# Patient Record
Sex: Female | Born: 1955
Health system: Southern US, Community
[De-identification: ages and names within clinical notes are randomized; demographics above are authoritative.]

## PROBLEM LIST (undated history)

## (undated) DIAGNOSIS — R600 Localized edema: Secondary | ICD-10-CM

## (undated) DIAGNOSIS — G40909 Epilepsy, unspecified, not intractable, without status epilepticus: Secondary | ICD-10-CM

## (undated) DIAGNOSIS — Z8669 Personal history of other diseases of the nervous system and sense organs: Secondary | ICD-10-CM

## (undated) DIAGNOSIS — M1990 Unspecified osteoarthritis, unspecified site: Secondary | ICD-10-CM

## (undated) DIAGNOSIS — M8580 Other specified disorders of bone density and structure, unspecified site: Secondary | ICD-10-CM

## (undated) DIAGNOSIS — R011 Cardiac murmur, unspecified: Secondary | ICD-10-CM

## (undated) DIAGNOSIS — G2119 Other drug induced secondary parkinsonism: Secondary | ICD-10-CM

## (undated) DIAGNOSIS — R569 Unspecified convulsions: Secondary | ICD-10-CM

## (undated) HISTORY — DX: Other specified disorders of bone density and structure, unspecified site: M85.80

## (undated) HISTORY — DX: Epilepsy, unspecified, not intractable, without status epilepticus: G40.909

## (undated) HISTORY — DX: Other drug induced secondary parkinsonism: G21.19

## (undated) HISTORY — DX: Personal history of other diseases of the nervous system and sense organs: Z86.69

## (undated) HISTORY — DX: Cardiac murmur, unspecified: R01.1

## (undated) HISTORY — DX: Localized edema: R60.0

## (undated) HISTORY — PX: TONSILLECTOMY: SUR1361

## (undated) HISTORY — DX: Unspecified osteoarthritis, unspecified site: M19.90

## (undated) HISTORY — PX: ESOPHAGOGASTRODUODENOSCOPY: SHX1529

---

## 1998-11-12 ENCOUNTER — Emergency Department (HOSPITAL_COMMUNITY): Admission: EM | Admit: 1998-11-12 | Discharge: 1998-11-12 | Payer: Self-pay | Admitting: *Deleted

## 1998-11-12 ENCOUNTER — Encounter: Payer: Self-pay | Admitting: Emergency Medicine

## 1998-11-12 ENCOUNTER — Encounter: Payer: Self-pay | Admitting: *Deleted

## 2000-01-21 ENCOUNTER — Other Ambulatory Visit: Admission: RE | Admit: 2000-01-21 | Discharge: 2000-01-21 | Payer: Self-pay | Admitting: Family Medicine

## 2000-01-31 ENCOUNTER — Other Ambulatory Visit: Admission: RE | Admit: 2000-01-31 | Discharge: 2000-01-31 | Payer: Self-pay | Admitting: *Deleted

## 2001-07-22 ENCOUNTER — Other Ambulatory Visit: Admission: RE | Admit: 2001-07-22 | Discharge: 2001-07-22 | Payer: Self-pay | Admitting: Obstetrics and Gynecology

## 2001-07-30 ENCOUNTER — Ambulatory Visit (HOSPITAL_COMMUNITY): Admission: RE | Admit: 2001-07-30 | Discharge: 2001-07-30 | Payer: Self-pay | Admitting: Obstetrics and Gynecology

## 2002-09-22 ENCOUNTER — Other Ambulatory Visit: Admission: RE | Admit: 2002-09-22 | Discharge: 2002-09-22 | Payer: Self-pay | Admitting: Obstetrics and Gynecology

## 2003-11-08 ENCOUNTER — Other Ambulatory Visit: Admission: RE | Admit: 2003-11-08 | Discharge: 2003-11-08 | Payer: Self-pay | Admitting: Obstetrics and Gynecology

## 2005-07-18 ENCOUNTER — Other Ambulatory Visit: Admission: RE | Admit: 2005-07-18 | Discharge: 2005-07-18 | Payer: Self-pay | Admitting: Obstetrics and Gynecology

## 2010-04-29 ENCOUNTER — Emergency Department (HOSPITAL_COMMUNITY): Admission: EM | Admit: 2010-04-29 | Discharge: 2010-04-29 | Payer: Self-pay | Admitting: Family Medicine

## 2012-10-02 ENCOUNTER — Encounter (HOSPITAL_COMMUNITY): Payer: Self-pay | Admitting: Emergency Medicine

## 2012-10-02 ENCOUNTER — Emergency Department (HOSPITAL_COMMUNITY)
Admission: EM | Admit: 2012-10-02 | Discharge: 2012-10-02 | Disposition: A | Discharge status: Home / Self Care | Payer: BC Managed Care – PPO | Attending: Emergency Medicine | Admitting: Emergency Medicine

## 2012-10-02 ENCOUNTER — Emergency Department (HOSPITAL_COMMUNITY): Payer: BC Managed Care – PPO

## 2012-10-02 DIAGNOSIS — R569 Unspecified convulsions: Secondary | ICD-10-CM | POA: Insufficient documentation

## 2012-10-02 DIAGNOSIS — Y9389 Activity, other specified: Secondary | ICD-10-CM | POA: Insufficient documentation

## 2012-10-02 DIAGNOSIS — W010XXA Fall on same level from slipping, tripping and stumbling without subsequent striking against object, initial encounter: Secondary | ICD-10-CM | POA: Insufficient documentation

## 2012-10-02 DIAGNOSIS — Z79899 Other long term (current) drug therapy: Secondary | ICD-10-CM | POA: Insufficient documentation

## 2012-10-02 DIAGNOSIS — S0100XA Unspecified open wound of scalp, initial encounter: Secondary | ICD-10-CM | POA: Insufficient documentation

## 2012-10-02 DIAGNOSIS — S0101XA Laceration without foreign body of scalp, initial encounter: Secondary | ICD-10-CM

## 2012-10-02 DIAGNOSIS — S0003XA Contusion of scalp, initial encounter: Secondary | ICD-10-CM | POA: Insufficient documentation

## 2012-10-02 DIAGNOSIS — W19XXXA Unspecified fall, initial encounter: Secondary | ICD-10-CM | POA: Insufficient documentation

## 2012-10-02 DIAGNOSIS — S0083XA Contusion of other part of head, initial encounter: Secondary | ICD-10-CM | POA: Insufficient documentation

## 2012-10-02 DIAGNOSIS — Y939 Activity, unspecified: Secondary | ICD-10-CM | POA: Insufficient documentation

## 2012-10-02 DIAGNOSIS — Y92009 Unspecified place in unspecified non-institutional (private) residence as the place of occurrence of the external cause: Secondary | ICD-10-CM | POA: Insufficient documentation

## 2012-10-02 DIAGNOSIS — Z23 Encounter for immunization: Secondary | ICD-10-CM | POA: Insufficient documentation

## 2012-10-02 HISTORY — DX: Unspecified convulsions: R56.9

## 2012-10-02 LAB — COMPREHENSIVE METABOLIC PANEL
Alkaline Phosphatase: 80 U/L (ref 39–117)
BUN: 13 mg/dL (ref 6–23)
Chloride: 100 mEq/L (ref 96–112)
GFR calc Af Amer: >90 mL/min (ref >90)
Glucose, Bld: 93 mg/dL (ref 70–99)
Potassium: 3.8 mEq/L (ref 3.5–5.1)
Total Bilirubin: 0.3 mg/dL (ref 0.3–1.2)
Total Protein: 6.8 g/dL (ref 6.0–8.3)

## 2012-10-02 LAB — CBC
Platelets: 211 10*3/uL (ref 150–400)
RDW: 13.7 % (ref 11.5–15.5)
WBC: 9.1 10*3/uL (ref 4.0–10.5)

## 2012-10-02 MED ORDER — TETANUS-DIPHTH-ACELL PERTUSSIS 5-2.5-18.5 LF-MCG/0.5 IM SUSP
0.5000 mL | Freq: Once | INTRAMUSCULAR | Status: DC
  Filled 2012-10-02: qty 0.5

## 2012-10-02 MED ORDER — TETANUS-DIPHTH-ACELL PERTUSSIS 5-2.5-18.5 LF-MCG/0.5 IM SUSP
0.5000 mL | Freq: Once | INTRAMUSCULAR | Status: AC
  Administered 2012-10-02: 0.5 mL via INTRAMUSCULAR

## 2012-10-02 NOTE — ED Notes (Signed)
Patient transported to CT 

## 2012-10-02 NOTE — ED Notes (Signed)
Returned from CT.

## 2012-10-02 NOTE — ED Provider Notes (Signed)
Patient seen by Dr Denton Lank.  I saw patient only for repair of occipital head laceration. Pt with Y-shaped laceration over occiput.    LACERATION REPAIR Performed by: Trixie Dredge B Authorized by: Trixie Dredge B Consent: Verbal consent obtained. Risks and benefits: risks, benefits and alternatives were discussed Consent given by: patient Patient identity confirmed: provided demographic data Prepped and Draped in normal sterile fashion Wound explored  Laceration Location: occiput  Laceration Length: 6cm  No Foreign Bodies seen or palpated  Anesthesia: local infiltration  Local anesthetic: lidocaine 2% with epinephrine  Anesthetic total: 6 ml  Irrigation method: syringe Amount of cleaning: standard (following wound care by tech)  Skin closure: staples  Number of staples: 12  Technique: see above  Patient tolerance: Patient tolerated the procedure well with no immediate complications.   [place], [place] 10/02/12 1927

## 2012-10-02 NOTE — ED Notes (Signed)
Per EMS, patient tripped on step getting mail, hit back of head, no LOC-left lower head laceration, approximately 150cc of blood loss

## 2012-10-02 NOTE — ED Notes (Signed)
Patient is alert and oriented x3.  She was given DC instructions and follow up visit instructions.  Patient gave verbal understanding. She was DC ambulatory under her own power to home.  V/S stable.  He was not showing any signs of distress on DC 

## 2012-10-02 NOTE — ED Provider Notes (Signed)
History     CSN: [id number]  Arrival date & time 10/02/12  1628   First MD Initiated Contact with Patient 10/02/12 1754      Chief Complaint  Patient presents with  . Fall  . Head Laceration    (Consider location/radiation/quality/duration/timing/severity/associated sxs/prior treatment) Patient is a 56 y.o. female presenting with fall and scalp laceration. The history is provided by the patient and the spouse.  Fall Pertinent negatives include no fever, no numbness, no abdominal pain and no headaches.  Head Laceration Pertinent negatives include no chest pain, no abdominal pain, no headaches and no shortness of breath.  pt w hx seizures presents after fall at home. States was in front of home, states unsure what cause fall, but denies loc/syncope. Denies fever. When family got to her was awake and alert, at baseline with no postictal period. w seizures, has 8-10 seizures at baseline. States neurologist recently adjusted her meds, no new meds, states compliant w rx. Denies recent increase in seizure frequency. No headache. Contusion/laceration to posterior scalp. Denies neck or back pain. No numbness/weakness. Has been eating and drinking normally. No nvd. No abd pain. No cp or sob. No palpitations or irregular heartbeat. No faintness.  Tetanus unknown. Denies other pain/injury. No fever or chills.      Past Medical History  Diagnosis Date  . Seizures     No past surgical history on file.  No family history on file.  History  Substance Use Topics  . Smoking status: Never Smoker   . Smokeless tobacco: Not on file  . Alcohol Use: No    OB History    Grav Para Term Preterm Abortions TAB SAB Ect Mult Living                  Review of Systems  Constitutional: Negative for fever and chills.  HENT: Negative for neck pain.   Eyes: Negative for visual disturbance.  Respiratory: Negative for shortness of breath.   Cardiovascular: Negative for chest pain and palpitations.   Gastrointestinal: Negative for abdominal pain.  Genitourinary: Negative for dysuria and flank pain.  Musculoskeletal: Negative for back pain.  Skin: Positive for wound.  Neurological: Negative for weakness, numbness and headaches.  Hematological: Does not bruise/bleed easily.  Psychiatric/Behavioral: Negative for confusion.    Allergies  Review of patient's allergies indicates no known allergies.  Home Medications   Current Outpatient Rx  Name  Route  Sig  Dispense  Refill  . ARIPIPRAZOLE 20 MG PO TABS   Oral   Take 20 mg by mouth at bedtime.         Marland Kitchen BENZTROPINE MESYLATE 0.5 MG PO TABS   Oral   Take 0.5 mg by mouth 2 (two) times daily.         . CLOBAZAM 10 MG PO TABS   Oral   Take 10 mg by mouth 2 (two) times daily.         Marland Kitchen LACOSAMIDE 100 MG PO TABS   Oral   Take 150 mg by mouth 2 (two) times daily.         Marland Kitchen LAMOTRIGINE 200 MG PO TABS   Oral   Take 400 mg by mouth 2 (two) times daily.         Marland Kitchen LEVETIRACETAM 500 MG PO TABS   Oral   Take 1,000 mg by mouth every 12 (twelve) hours. Two tablets in the morning and two in the evening  BP 148/78  Pulse 72  Temp 97.5 F (36.4 C) (Oral)  Resp 18  Physical Exam  Nursing note and vitals reviewed. Constitutional: She is oriented to person, place, and time. She appears well-developed and well-nourished. No distress.  HENT:  Mouth/Throat: Oropharynx is clear and moist.       Contusion, laceration to posterior scalp. No oral or tongue trauma.   Eyes: Conjunctivae normal are normal. No scleral icterus.  Neck: Normal range of motion. Neck supple. No tracheal deviation present.       No stiffness or rigidity. No bruit. Mid cervical tenderness, mild.   Cardiovascular: Normal rate, regular rhythm, normal heart sounds and intact distal pulses.   Pulmonary/Chest: Effort normal and breath sounds normal. No respiratory distress. She exhibits no tenderness.  Abdominal: Soft. Normal appearance. She  exhibits no distension. There is no tenderness.  Genitourinary:       No cva tenderness  Musculoskeletal: She exhibits no edema and no tenderness.       Good rom bil extremities, no focal bony tenderness. Mid cervical mild tenderness, otherwise CTLS spine, non tender, aligned, no step off.   Neurological: She is alert and oriented to person, place, and time.       Motor intact bil.   Skin: Skin is warm and dry. No rash noted.  Psychiatric: She has a normal mood and affect.    ED Course  Procedures (including critical care time)  Results for orders placed during the hospital encounter of 10/02/12  COMPREHENSIVE METABOLIC PANEL      Component Value Range   Sodium 141  135 - 145 mEq/L   Potassium 3.8  3.5 - 5.1 mEq/L   Chloride 100  96 - 112 mEq/L   CO2 33 (*) 19 - 32 mEq/L   Glucose, Bld 93  70 - 99 mg/dL   BUN 13  6 - 23 mg/dL   Creatinine, Ser 1.61  0.50 - 1.10 mg/dL   Calcium 9.5  8.4 - 09.6 mg/dL   Total Protein 6.8  6.0 - 8.3 g/dL   Albumin 3.8  3.5 - 5.2 g/dL   AST 23  0 - 37 U/L   ALT 15  0 - 35 U/L   Alkaline Phosphatase 80  39 - 117 U/L   Total Bilirubin 0.3  0.3 - 1.2 mg/dL   GFR calc non Af Amer 78 (*) >90 mL/min   GFR calc Af Amer >90  >90 mL/min  CBC      Component Value Range   WBC 9.1  4.0 - 10.5 K/uL   RBC 4.13  3.87 - 5.11 MIL/uL   Hemoglobin 12.6  12.0 - 15.0 g/dL   HCT 04.5  40.9 - 81.1 %   MCV 91.5  78.0 - 100.0 fL   MCH 30.5  26.0 - 34.0 pg   MCHC 33.3  30.0 - 36.0 g/dL   RDW 91.4  78.2 - 95.6 %   Platelets 211  150 - 400 K/uL   Ct Head Wo Contrast  10/02/2012  *RADIOLOGY REPORT*  Clinical Data:  Fall, laceration  CT HEAD WITHOUT CONTRAST CT CERVICAL SPINE WITHOUT CONTRAST  Technique:  Multidetector CT imaging of the head and cervical spine was performed following the standard protocol without intravenous contrast.  Multiplanar CT image reconstructions of the cervical spine were also generated.  Comparison:   None  CT HEAD  Findings: No skull fracture  is noted.  Paranasal sinuses and mastoid air cells are unremarkable. A scalp  laceration and soft tissue irregularity in the left posterior parietal region.  Tiny amount of subcutaneous air is noted in this region.  No intracranial hemorrhage, mass effect or midline shift.  No acute infarction.  No mass lesion is noted on this unenhanced scan.  IMPRESSION: No acute intracranial abnormality. Scalp laceration, soft tissue irregularity and small amount of subcutaneous air in the left posterior parietal region.  CT CERVICAL SPINE  Findings: Axial images of the cervical spine shows no acute fracture or subluxation.  There is no pneumothorax in visualized lung apices.  Computer processed images shows no acute fracture or subluxation.  There is disc space flattening with mild posterior spurring at C4-C5 level. Disc space flattening with mild anterior and mild posterior spurring noted at C5-C6 and C6-C7 level. No prevertebral soft tissue swelling.  Cervical airway is patent.  IMPRESSION: No acute fracture or subluxation.  Mild degenerative changes as described above.   Original Report Authenticated By: Natasha Mead, M.D.    Ct Cervical Spine Wo Contrast  10/02/2012  *RADIOLOGY REPORT*  Clinical Data:  Fall, laceration  CT HEAD WITHOUT CONTRAST CT CERVICAL SPINE WITHOUT CONTRAST  Technique:  Multidetector CT imaging of the head and cervical spine was performed following the standard protocol without intravenous contrast.  Multiplanar CT image reconstructions of the cervical spine were also generated.  Comparison:   None  CT HEAD  Findings: No skull fracture is noted.  Paranasal sinuses and mastoid air cells are unremarkable. A scalp laceration and soft tissue irregularity in the left posterior parietal region.  Tiny amount of subcutaneous air is noted in this region.  No intracranial hemorrhage, mass effect or midline shift.  No acute infarction.  No mass lesion is noted on this unenhanced scan.  IMPRESSION: No acute  intracranial abnormality. Scalp laceration, soft tissue irregularity and small amount of subcutaneous air in the left posterior parietal region.  CT CERVICAL SPINE  Findings: Axial images of the cervical spine shows no acute fracture or subluxation.  There is no pneumothorax in visualized lung apices.  Computer processed images shows no acute fracture or subluxation.  There is disc space flattening with mild posterior spurring at C4-C5 level. Disc space flattening with mild anterior and mild posterior spurring noted at C5-C6 and C6-C7 level. No prevertebral soft tissue swelling.  Cervical airway is patent.  IMPRESSION: No acute fracture or subluxation.  Mild degenerative changes as described above.   Original Report Authenticated By: Natasha Mead, M.D.        MDM  Ct. Wound care.  Reviewed nursing notes and prior charts for additional history.    Date: 10/02/2012  Rate: 60  Rhythm: normal sinus rhythm  QRS Axis: normal  Intervals: normal  ST/T Wave abnormalities: normal  Conduction Disutrbances:none  Narrative Interpretation:   Old EKG Reviewed: none available  Recheck alert content, no distress.   Recheck spine nt. pts mental status at baseline. No sz activity. No incontinence, oral trauma, sz activity noted, or post ictal period.   Pt appears stable for d/c.   LACERATION REPAIR Performed by: Rennis Chris by PA Shelva Majestic Authorized by: Suzi Roots Consent: Verbal consent obtained. Risks and benefits: risks, benefits and alternatives were discussed Consent given by: patient Patient identity confirmed: provided demographic data Prepped and Draped in normal sterile fashion Wound explored  Laceration Location: scalp  Laceration Length: 6cm  No Foreign Bodies seen or palpated  Anesthesia: local infiltration  Local anesthetic: lidocaine 2% w epinephrine  Anesthetic total: 8 ml  Irrigation  method: syringe Amount of cleaning: standard  Skin closure: surgical  staples  Number of sutures: staples, 10  Technique: staples  Patient tolerance: Patient tolerated the procedure well with no immediate complications.         Suzi Roots, MD 10/02/12 2023

## 2012-10-06 NOTE — ED Provider Notes (Signed)
Medical screening examination/treatment/procedure(s) were conducted as a shared visit with non-physician practitioner(s) and myself.  I personally evaluated the patient during the encounter See h and P  Suzi Roots, MD 10/06/12 1102

## 2013-01-26 DIAGNOSIS — H40019 Open angle with borderline findings, low risk, unspecified eye: Secondary | ICD-10-CM | POA: Diagnosis not present

## 2013-02-05 DIAGNOSIS — H40019 Open angle with borderline findings, low risk, unspecified eye: Secondary | ICD-10-CM | POA: Diagnosis not present

## 2013-07-20 DIAGNOSIS — Z01419 Encounter for gynecological examination (general) (routine) without abnormal findings: Secondary | ICD-10-CM | POA: Diagnosis not present

## 2013-07-20 DIAGNOSIS — Z1231 Encounter for screening mammogram for malignant neoplasm of breast: Secondary | ICD-10-CM | POA: Diagnosis not present

## 2013-07-26 DIAGNOSIS — M899 Disorder of bone, unspecified: Secondary | ICD-10-CM | POA: Diagnosis not present

## 2013-07-26 DIAGNOSIS — Z13820 Encounter for screening for osteoporosis: Secondary | ICD-10-CM | POA: Diagnosis not present

## 2013-10-29 DIAGNOSIS — L821 Other seborrheic keratosis: Secondary | ICD-10-CM | POA: Diagnosis not present

## 2013-11-30 DIAGNOSIS — H40019 Open angle with borderline findings, low risk, unspecified eye: Secondary | ICD-10-CM | POA: Diagnosis not present

## 2014-01-31 DIAGNOSIS — H40029 Open angle with borderline findings, high risk, unspecified eye: Secondary | ICD-10-CM | POA: Diagnosis not present

## 2014-02-14 DIAGNOSIS — S93609A Unspecified sprain of unspecified foot, initial encounter: Secondary | ICD-10-CM | POA: Diagnosis not present

## 2014-02-14 DIAGNOSIS — S92919A Unspecified fracture of unspecified toe(s), initial encounter for closed fracture: Secondary | ICD-10-CM | POA: Diagnosis not present

## 2014-02-21 DIAGNOSIS — S92309A Fracture of unspecified metatarsal bone(s), unspecified foot, initial encounter for closed fracture: Secondary | ICD-10-CM | POA: Diagnosis not present

## 2014-03-15 DIAGNOSIS — S92309A Fracture of unspecified metatarsal bone(s), unspecified foot, initial encounter for closed fracture: Secondary | ICD-10-CM | POA: Diagnosis not present

## 2014-05-05 DIAGNOSIS — L255 Unspecified contact dermatitis due to plants, except food: Secondary | ICD-10-CM | POA: Diagnosis not present

## 2014-05-26 DIAGNOSIS — M949 Disorder of cartilage, unspecified: Secondary | ICD-10-CM | POA: Diagnosis not present

## 2014-05-26 DIAGNOSIS — M899 Disorder of bone, unspecified: Secondary | ICD-10-CM | POA: Diagnosis not present

## 2014-05-26 DIAGNOSIS — Z79899 Other long term (current) drug therapy: Secondary | ICD-10-CM | POA: Diagnosis not present

## 2014-05-26 DIAGNOSIS — Z13220 Encounter for screening for lipoid disorders: Secondary | ICD-10-CM | POA: Diagnosis not present

## 2014-05-26 DIAGNOSIS — Z0000 Encounter for general adult medical examination without abnormal findings: Secondary | ICD-10-CM | POA: Diagnosis not present

## 2014-05-26 DIAGNOSIS — F29 Unspecified psychosis not due to a substance or known physiological condition: Secondary | ICD-10-CM | POA: Diagnosis not present

## 2014-05-26 DIAGNOSIS — R569 Unspecified convulsions: Secondary | ICD-10-CM | POA: Diagnosis not present

## 2014-05-26 DIAGNOSIS — G4733 Obstructive sleep apnea (adult) (pediatric): Secondary | ICD-10-CM | POA: Diagnosis not present

## 2014-05-27 DIAGNOSIS — Z136 Encounter for screening for cardiovascular disorders: Secondary | ICD-10-CM | POA: Diagnosis not present

## 2014-05-27 DIAGNOSIS — F29 Unspecified psychosis not due to a substance or known physiological condition: Secondary | ICD-10-CM | POA: Diagnosis not present

## 2014-05-27 DIAGNOSIS — M899 Disorder of bone, unspecified: Secondary | ICD-10-CM | POA: Diagnosis not present

## 2014-05-27 DIAGNOSIS — R569 Unspecified convulsions: Secondary | ICD-10-CM | POA: Diagnosis not present

## 2014-05-27 DIAGNOSIS — Z79899 Other long term (current) drug therapy: Secondary | ICD-10-CM | POA: Diagnosis not present

## 2014-05-27 DIAGNOSIS — G4733 Obstructive sleep apnea (adult) (pediatric): Secondary | ICD-10-CM | POA: Diagnosis not present

## 2014-05-27 DIAGNOSIS — Z0000 Encounter for general adult medical examination without abnormal findings: Secondary | ICD-10-CM | POA: Diagnosis not present

## 2014-09-01 DIAGNOSIS — Z23 Encounter for immunization: Secondary | ICD-10-CM | POA: Diagnosis not present

## 2014-10-12 DIAGNOSIS — G40109 Localization-related (focal) (partial) symptomatic epilepsy and epileptic syndromes with simple partial seizures, not intractable, without status epilepticus: Secondary | ICD-10-CM | POA: Diagnosis not present

## 2015-02-23 DIAGNOSIS — H524 Presbyopia: Secondary | ICD-10-CM | POA: Diagnosis not present

## 2015-02-23 DIAGNOSIS — H40023 Open angle with borderline findings, high risk, bilateral: Secondary | ICD-10-CM | POA: Diagnosis not present

## 2015-04-26 DIAGNOSIS — M79605 Pain in left leg: Secondary | ICD-10-CM | POA: Diagnosis not present

## 2015-04-26 DIAGNOSIS — M25562 Pain in left knee: Secondary | ICD-10-CM | POA: Diagnosis not present

## 2015-07-05 DIAGNOSIS — Z9989 Dependence on other enabling machines and devices: Secondary | ICD-10-CM | POA: Diagnosis not present

## 2015-07-05 DIAGNOSIS — Z79899 Other long term (current) drug therapy: Secondary | ICD-10-CM | POA: Diagnosis not present

## 2015-07-05 DIAGNOSIS — G40109 Localization-related (focal) (partial) symptomatic epilepsy and epileptic syndromes with simple partial seizures, not intractable, without status epilepticus: Secondary | ICD-10-CM | POA: Diagnosis not present

## 2015-07-05 DIAGNOSIS — G4733 Obstructive sleep apnea (adult) (pediatric): Secondary | ICD-10-CM | POA: Diagnosis not present

## 2015-07-05 DIAGNOSIS — Z8659 Personal history of other mental and behavioral disorders: Secondary | ICD-10-CM | POA: Diagnosis not present

## 2015-08-03 DIAGNOSIS — Z6831 Body mass index (BMI) 31.0-31.9, adult: Secondary | ICD-10-CM | POA: Diagnosis not present

## 2015-08-03 DIAGNOSIS — Z124 Encounter for screening for malignant neoplasm of cervix: Secondary | ICD-10-CM | POA: Diagnosis not present

## 2015-08-03 DIAGNOSIS — Z1231 Encounter for screening mammogram for malignant neoplasm of breast: Secondary | ICD-10-CM | POA: Diagnosis not present

## 2015-08-03 DIAGNOSIS — M8588 Other specified disorders of bone density and structure, other site: Secondary | ICD-10-CM | POA: Diagnosis not present

## 2015-08-03 DIAGNOSIS — N958 Other specified menopausal and perimenopausal disorders: Secondary | ICD-10-CM | POA: Diagnosis not present

## 2015-08-03 DIAGNOSIS — Z01419 Encounter for gynecological examination (general) (routine) without abnormal findings: Secondary | ICD-10-CM | POA: Diagnosis not present

## 2015-09-19 DIAGNOSIS — G2119 Other drug induced secondary parkinsonism: Secondary | ICD-10-CM | POA: Diagnosis not present

## 2016-04-11 DIAGNOSIS — M25562 Pain in left knee: Secondary | ICD-10-CM | POA: Diagnosis not present

## 2016-04-12 DIAGNOSIS — M25562 Pain in left knee: Secondary | ICD-10-CM | POA: Diagnosis not present

## 2016-04-22 DIAGNOSIS — D225 Melanocytic nevi of trunk: Secondary | ICD-10-CM | POA: Diagnosis not present

## 2016-04-22 DIAGNOSIS — L821 Other seborrheic keratosis: Secondary | ICD-10-CM | POA: Diagnosis not present

## 2016-04-22 DIAGNOSIS — Z808 Family history of malignant neoplasm of other organs or systems: Secondary | ICD-10-CM | POA: Diagnosis not present

## 2016-04-22 DIAGNOSIS — D485 Neoplasm of uncertain behavior of skin: Secondary | ICD-10-CM | POA: Diagnosis not present

## 2016-04-22 DIAGNOSIS — D1801 Hemangioma of skin and subcutaneous tissue: Secondary | ICD-10-CM | POA: Diagnosis not present

## 2016-05-14 DIAGNOSIS — M1712 Unilateral primary osteoarthritis, left knee: Secondary | ICD-10-CM | POA: Diagnosis not present

## 2016-05-23 DIAGNOSIS — E785 Hyperlipidemia, unspecified: Secondary | ICD-10-CM | POA: Diagnosis not present

## 2016-05-27 DIAGNOSIS — Z23 Encounter for immunization: Secondary | ICD-10-CM | POA: Diagnosis not present

## 2016-05-27 DIAGNOSIS — Z0000 Encounter for general adult medical examination without abnormal findings: Secondary | ICD-10-CM | POA: Diagnosis not present

## 2016-05-31 DIAGNOSIS — R21 Rash and other nonspecific skin eruption: Secondary | ICD-10-CM | POA: Diagnosis not present

## 2016-06-27 DIAGNOSIS — Z9989 Dependence on other enabling machines and devices: Secondary | ICD-10-CM | POA: Diagnosis not present

## 2016-06-27 DIAGNOSIS — Z79899 Other long term (current) drug therapy: Secondary | ICD-10-CM | POA: Diagnosis not present

## 2016-06-27 DIAGNOSIS — G40209 Localization-related (focal) (partial) symptomatic epilepsy and epileptic syndromes with complex partial seizures, not intractable, without status epilepticus: Secondary | ICD-10-CM | POA: Diagnosis not present

## 2016-06-27 DIAGNOSIS — G4733 Obstructive sleep apnea (adult) (pediatric): Secondary | ICD-10-CM | POA: Diagnosis not present

## 2016-09-03 DIAGNOSIS — Z23 Encounter for immunization: Secondary | ICD-10-CM | POA: Diagnosis not present

## 2016-09-23 DIAGNOSIS — Z01419 Encounter for gynecological examination (general) (routine) without abnormal findings: Secondary | ICD-10-CM | POA: Diagnosis not present

## 2016-09-23 DIAGNOSIS — Z1231 Encounter for screening mammogram for malignant neoplasm of breast: Secondary | ICD-10-CM | POA: Diagnosis not present

## 2016-09-23 DIAGNOSIS — Z6829 Body mass index (BMI) 29.0-29.9, adult: Secondary | ICD-10-CM | POA: Diagnosis not present

## 2016-09-25 DIAGNOSIS — M1712 Unilateral primary osteoarthritis, left knee: Secondary | ICD-10-CM | POA: Diagnosis not present

## 2016-11-08 DIAGNOSIS — M1712 Unilateral primary osteoarthritis, left knee: Secondary | ICD-10-CM | POA: Diagnosis not present

## 2016-11-18 DIAGNOSIS — M1712 Unilateral primary osteoarthritis, left knee: Secondary | ICD-10-CM | POA: Diagnosis not present

## 2016-11-22 DIAGNOSIS — M1712 Unilateral primary osteoarthritis, left knee: Secondary | ICD-10-CM | POA: Diagnosis not present

## 2016-11-26 DIAGNOSIS — Z1211 Encounter for screening for malignant neoplasm of colon: Secondary | ICD-10-CM | POA: Diagnosis not present

## 2016-11-27 DIAGNOSIS — M1712 Unilateral primary osteoarthritis, left knee: Secondary | ICD-10-CM | POA: Diagnosis not present

## 2016-11-29 DIAGNOSIS — M1712 Unilateral primary osteoarthritis, left knee: Secondary | ICD-10-CM | POA: Diagnosis not present

## 2016-12-03 DIAGNOSIS — M1712 Unilateral primary osteoarthritis, left knee: Secondary | ICD-10-CM | POA: Diagnosis not present

## 2016-12-04 DIAGNOSIS — H25013 Cortical age-related cataract, bilateral: Secondary | ICD-10-CM | POA: Diagnosis not present

## 2016-12-04 DIAGNOSIS — H40023 Open angle with borderline findings, high risk, bilateral: Secondary | ICD-10-CM | POA: Diagnosis not present

## 2016-12-04 DIAGNOSIS — Z83511 Family history of glaucoma: Secondary | ICD-10-CM | POA: Diagnosis not present

## 2016-12-04 DIAGNOSIS — H2513 Age-related nuclear cataract, bilateral: Secondary | ICD-10-CM | POA: Diagnosis not present

## 2016-12-06 DIAGNOSIS — M1712 Unilateral primary osteoarthritis, left knee: Secondary | ICD-10-CM | POA: Diagnosis not present

## 2016-12-09 DIAGNOSIS — H2513 Age-related nuclear cataract, bilateral: Secondary | ICD-10-CM | POA: Diagnosis not present

## 2016-12-09 DIAGNOSIS — H40023 Open angle with borderline findings, high risk, bilateral: Secondary | ICD-10-CM | POA: Diagnosis not present

## 2016-12-09 DIAGNOSIS — Z83511 Family history of glaucoma: Secondary | ICD-10-CM | POA: Diagnosis not present

## 2016-12-10 DIAGNOSIS — M1712 Unilateral primary osteoarthritis, left knee: Secondary | ICD-10-CM | POA: Diagnosis not present

## 2016-12-13 DIAGNOSIS — M1712 Unilateral primary osteoarthritis, left knee: Secondary | ICD-10-CM | POA: Diagnosis not present

## 2016-12-17 DIAGNOSIS — M1712 Unilateral primary osteoarthritis, left knee: Secondary | ICD-10-CM | POA: Diagnosis not present

## 2016-12-24 DIAGNOSIS — M1712 Unilateral primary osteoarthritis, left knee: Secondary | ICD-10-CM | POA: Diagnosis not present

## 2016-12-26 DIAGNOSIS — M1712 Unilateral primary osteoarthritis, left knee: Secondary | ICD-10-CM | POA: Diagnosis not present

## 2016-12-31 DIAGNOSIS — M1712 Unilateral primary osteoarthritis, left knee: Secondary | ICD-10-CM | POA: Diagnosis not present

## 2017-01-03 DIAGNOSIS — M1712 Unilateral primary osteoarthritis, left knee: Secondary | ICD-10-CM | POA: Diagnosis not present

## 2017-01-08 DIAGNOSIS — M1712 Unilateral primary osteoarthritis, left knee: Secondary | ICD-10-CM | POA: Diagnosis not present

## 2017-01-21 DIAGNOSIS — G40802 Other epilepsy, not intractable, without status epilepticus: Secondary | ICD-10-CM | POA: Diagnosis not present

## 2017-01-21 DIAGNOSIS — Z9989 Dependence on other enabling machines and devices: Secondary | ICD-10-CM | POA: Diagnosis not present

## 2017-01-21 DIAGNOSIS — Z8659 Personal history of other mental and behavioral disorders: Secondary | ICD-10-CM | POA: Diagnosis not present

## 2017-01-21 DIAGNOSIS — R569 Unspecified convulsions: Secondary | ICD-10-CM | POA: Diagnosis not present

## 2017-01-21 DIAGNOSIS — G4733 Obstructive sleep apnea (adult) (pediatric): Secondary | ICD-10-CM | POA: Diagnosis not present

## 2017-01-21 DIAGNOSIS — Z79899 Other long term (current) drug therapy: Secondary | ICD-10-CM | POA: Diagnosis not present

## 2017-02-14 DIAGNOSIS — M76822 Posterior tibial tendinitis, left leg: Secondary | ICD-10-CM | POA: Diagnosis not present

## 2017-02-14 DIAGNOSIS — M6702 Short Achilles tendon (acquired), left ankle: Secondary | ICD-10-CM | POA: Diagnosis not present

## 2017-02-28 DIAGNOSIS — M1712 Unilateral primary osteoarthritis, left knee: Secondary | ICD-10-CM | POA: Diagnosis not present

## 2017-04-16 DIAGNOSIS — M6702 Short Achilles tendon (acquired), left ankle: Secondary | ICD-10-CM | POA: Diagnosis not present

## 2017-04-16 DIAGNOSIS — M76822 Posterior tibial tendinitis, left leg: Secondary | ICD-10-CM | POA: Diagnosis not present

## 2017-06-04 DIAGNOSIS — Z0000 Encounter for general adult medical examination without abnormal findings: Secondary | ICD-10-CM | POA: Diagnosis not present

## 2017-06-04 DIAGNOSIS — M8580 Other specified disorders of bone density and structure, unspecified site: Secondary | ICD-10-CM | POA: Diagnosis not present

## 2017-06-04 DIAGNOSIS — R8290 Unspecified abnormal findings in urine: Secondary | ICD-10-CM | POA: Diagnosis not present

## 2017-06-04 DIAGNOSIS — Z79899 Other long term (current) drug therapy: Secondary | ICD-10-CM | POA: Diagnosis not present

## 2017-06-04 DIAGNOSIS — E663 Overweight: Secondary | ICD-10-CM | POA: Diagnosis not present

## 2017-06-04 DIAGNOSIS — G40909 Epilepsy, unspecified, not intractable, without status epilepticus: Secondary | ICD-10-CM | POA: Diagnosis not present

## 2017-06-04 DIAGNOSIS — F29 Unspecified psychosis not due to a substance or known physiological condition: Secondary | ICD-10-CM | POA: Diagnosis not present

## 2017-06-04 DIAGNOSIS — M899 Disorder of bone, unspecified: Secondary | ICD-10-CM | POA: Diagnosis not present

## 2017-06-04 DIAGNOSIS — Z6828 Body mass index (BMI) 28.0-28.9, adult: Secondary | ICD-10-CM | POA: Diagnosis not present

## 2017-06-04 DIAGNOSIS — R1310 Dysphagia, unspecified: Secondary | ICD-10-CM | POA: Diagnosis not present

## 2017-06-06 ENCOUNTER — Other Ambulatory Visit: Payer: Self-pay | Admitting: Gastroenterology

## 2017-06-06 DIAGNOSIS — R1310 Dysphagia, unspecified: Secondary | ICD-10-CM

## 2017-06-10 DIAGNOSIS — D1800 Hemangioma unspecified site: Secondary | ICD-10-CM | POA: Diagnosis not present

## 2017-06-10 DIAGNOSIS — D2222 Melanocytic nevi of left ear and external auricular canal: Secondary | ICD-10-CM | POA: Diagnosis not present

## 2017-06-10 DIAGNOSIS — Z808 Family history of malignant neoplasm of other organs or systems: Secondary | ICD-10-CM | POA: Diagnosis not present

## 2017-06-10 DIAGNOSIS — D2272 Melanocytic nevi of left lower limb, including hip: Secondary | ICD-10-CM | POA: Diagnosis not present

## 2017-06-10 DIAGNOSIS — H40023 Open angle with borderline findings, high risk, bilateral: Secondary | ICD-10-CM | POA: Diagnosis not present

## 2017-06-10 DIAGNOSIS — L821 Other seborrheic keratosis: Secondary | ICD-10-CM | POA: Diagnosis not present

## 2017-06-12 ENCOUNTER — Ambulatory Visit
Admission: RE | Admit: 2017-06-12 | Discharge: 2017-06-12 | Disposition: A | Discharge status: Home / Self Care | Payer: Medicare Other | Source: Ambulatory Visit | Attending: Gastroenterology | Admitting: Gastroenterology

## 2017-06-12 DIAGNOSIS — R1310 Dysphagia, unspecified: Secondary | ICD-10-CM

## 2017-06-12 DIAGNOSIS — Z23 Encounter for immunization: Secondary | ICD-10-CM | POA: Diagnosis not present

## 2017-06-12 DIAGNOSIS — K219 Gastro-esophageal reflux disease without esophagitis: Secondary | ICD-10-CM | POA: Diagnosis not present

## 2017-07-01 DIAGNOSIS — R1310 Dysphagia, unspecified: Secondary | ICD-10-CM | POA: Diagnosis not present

## 2017-07-01 DIAGNOSIS — K222 Esophageal obstruction: Secondary | ICD-10-CM | POA: Diagnosis not present

## 2017-07-22 DIAGNOSIS — Z9989 Dependence on other enabling machines and devices: Secondary | ICD-10-CM | POA: Diagnosis not present

## 2017-07-22 DIAGNOSIS — G40909 Epilepsy, unspecified, not intractable, without status epilepticus: Secondary | ICD-10-CM | POA: Diagnosis not present

## 2017-07-22 DIAGNOSIS — G4733 Obstructive sleep apnea (adult) (pediatric): Secondary | ICD-10-CM | POA: Diagnosis not present

## 2017-07-22 DIAGNOSIS — Z8659 Personal history of other mental and behavioral disorders: Secondary | ICD-10-CM | POA: Diagnosis not present

## 2017-07-22 DIAGNOSIS — Z79899 Other long term (current) drug therapy: Secondary | ICD-10-CM | POA: Diagnosis not present

## 2017-08-11 DIAGNOSIS — Z23 Encounter for immunization: Secondary | ICD-10-CM | POA: Diagnosis not present

## 2017-10-07 DIAGNOSIS — Z124 Encounter for screening for malignant neoplasm of cervix: Secondary | ICD-10-CM | POA: Diagnosis not present

## 2017-10-07 DIAGNOSIS — Z1231 Encounter for screening mammogram for malignant neoplasm of breast: Secondary | ICD-10-CM | POA: Diagnosis not present

## 2017-10-07 DIAGNOSIS — Z6829 Body mass index (BMI) 29.0-29.9, adult: Secondary | ICD-10-CM | POA: Diagnosis not present

## 2017-12-11 DIAGNOSIS — H25013 Cortical age-related cataract, bilateral: Secondary | ICD-10-CM | POA: Diagnosis not present

## 2017-12-11 DIAGNOSIS — H2513 Age-related nuclear cataract, bilateral: Secondary | ICD-10-CM | POA: Diagnosis not present

## 2017-12-11 DIAGNOSIS — H40023 Open angle with borderline findings, high risk, bilateral: Secondary | ICD-10-CM | POA: Diagnosis not present

## 2018-01-01 DIAGNOSIS — Z79899 Other long term (current) drug therapy: Secondary | ICD-10-CM | POA: Diagnosis not present

## 2018-01-01 DIAGNOSIS — G40909 Epilepsy, unspecified, not intractable, without status epilepticus: Secondary | ICD-10-CM | POA: Diagnosis not present

## 2018-01-01 DIAGNOSIS — R2681 Unsteadiness on feet: Secondary | ICD-10-CM | POA: Diagnosis not present

## 2018-02-10 DIAGNOSIS — G20 Parkinson's disease: Secondary | ICD-10-CM | POA: Diagnosis not present

## 2018-02-10 DIAGNOSIS — Z79899 Other long term (current) drug therapy: Secondary | ICD-10-CM | POA: Diagnosis not present

## 2018-05-26 DIAGNOSIS — G2111 Neuroleptic induced parkinsonism: Secondary | ICD-10-CM | POA: Diagnosis not present

## 2018-05-26 DIAGNOSIS — Z79899 Other long term (current) drug therapy: Secondary | ICD-10-CM | POA: Diagnosis not present

## 2018-05-26 DIAGNOSIS — G20 Parkinson's disease: Secondary | ICD-10-CM | POA: Diagnosis not present

## 2018-05-26 DIAGNOSIS — Z780 Asymptomatic menopausal state: Secondary | ICD-10-CM | POA: Diagnosis not present

## 2018-05-26 DIAGNOSIS — R251 Tremor, unspecified: Secondary | ICD-10-CM | POA: Diagnosis not present

## 2018-05-26 DIAGNOSIS — G40019 Localization-related (focal) (partial) idiopathic epilepsy and epileptic syndromes with seizures of localized onset, intractable, without status epilepticus: Secondary | ICD-10-CM | POA: Diagnosis not present

## 2018-05-26 DIAGNOSIS — Z9989 Dependence on other enabling machines and devices: Secondary | ICD-10-CM | POA: Diagnosis not present

## 2018-05-26 DIAGNOSIS — G4733 Obstructive sleep apnea (adult) (pediatric): Secondary | ICD-10-CM | POA: Diagnosis not present

## 2018-05-26 DIAGNOSIS — G40209 Localization-related (focal) (partial) symptomatic epilepsy and epileptic syndromes with complex partial seizures, not intractable, without status epilepticus: Secondary | ICD-10-CM | POA: Diagnosis not present

## 2018-06-18 DIAGNOSIS — H40023 Open angle with borderline findings, high risk, bilateral: Secondary | ICD-10-CM | POA: Diagnosis not present

## 2018-07-03 DIAGNOSIS — Z79899 Other long term (current) drug therapy: Secondary | ICD-10-CM | POA: Diagnosis not present

## 2018-07-03 DIAGNOSIS — Z1389 Encounter for screening for other disorder: Secondary | ICD-10-CM | POA: Diagnosis not present

## 2018-07-03 DIAGNOSIS — F29 Unspecified psychosis not due to a substance or known physiological condition: Secondary | ICD-10-CM | POA: Diagnosis not present

## 2018-07-03 DIAGNOSIS — G40909 Epilepsy, unspecified, not intractable, without status epilepticus: Secondary | ICD-10-CM | POA: Diagnosis not present

## 2018-07-03 DIAGNOSIS — G4733 Obstructive sleep apnea (adult) (pediatric): Secondary | ICD-10-CM | POA: Diagnosis not present

## 2018-07-03 DIAGNOSIS — M8580 Other specified disorders of bone density and structure, unspecified site: Secondary | ICD-10-CM | POA: Diagnosis not present

## 2018-07-03 DIAGNOSIS — Z0000 Encounter for general adult medical examination without abnormal findings: Secondary | ICD-10-CM | POA: Diagnosis not present

## 2018-07-03 DIAGNOSIS — G2119 Other drug induced secondary parkinsonism: Secondary | ICD-10-CM | POA: Diagnosis not present

## 2018-07-03 DIAGNOSIS — M1990 Unspecified osteoarthritis, unspecified site: Secondary | ICD-10-CM | POA: Diagnosis not present

## 2018-07-03 DIAGNOSIS — Z136 Encounter for screening for cardiovascular disorders: Secondary | ICD-10-CM | POA: Diagnosis not present

## 2018-07-03 DIAGNOSIS — Z23 Encounter for immunization: Secondary | ICD-10-CM | POA: Diagnosis not present

## 2018-08-04 DIAGNOSIS — L821 Other seborrheic keratosis: Secondary | ICD-10-CM | POA: Diagnosis not present

## 2018-08-04 DIAGNOSIS — D1801 Hemangioma of skin and subcutaneous tissue: Secondary | ICD-10-CM | POA: Diagnosis not present

## 2018-08-04 DIAGNOSIS — D239 Other benign neoplasm of skin, unspecified: Secondary | ICD-10-CM | POA: Diagnosis not present

## 2018-08-04 DIAGNOSIS — Z23 Encounter for immunization: Secondary | ICD-10-CM | POA: Diagnosis not present

## 2018-08-04 DIAGNOSIS — Z808 Family history of malignant neoplasm of other organs or systems: Secondary | ICD-10-CM | POA: Diagnosis not present

## 2018-10-13 DIAGNOSIS — Z01419 Encounter for gynecological examination (general) (routine) without abnormal findings: Secondary | ICD-10-CM | POA: Diagnosis not present

## 2018-10-13 DIAGNOSIS — Z1231 Encounter for screening mammogram for malignant neoplasm of breast: Secondary | ICD-10-CM | POA: Diagnosis not present

## 2018-10-13 DIAGNOSIS — N958 Other specified menopausal and perimenopausal disorders: Secondary | ICD-10-CM | POA: Diagnosis not present

## 2018-10-13 DIAGNOSIS — M8588 Other specified disorders of bone density and structure, other site: Secondary | ICD-10-CM | POA: Diagnosis not present

## 2018-10-13 DIAGNOSIS — Z6827 Body mass index (BMI) 27.0-27.9, adult: Secondary | ICD-10-CM | POA: Diagnosis not present

## 2018-11-03 DIAGNOSIS — Z79899 Other long term (current) drug therapy: Secondary | ICD-10-CM | POA: Diagnosis not present

## 2018-11-03 DIAGNOSIS — F068 Other specified mental disorders due to known physiological condition: Secondary | ICD-10-CM | POA: Diagnosis not present

## 2018-11-03 DIAGNOSIS — G40802 Other epilepsy, not intractable, without status epilepticus: Secondary | ICD-10-CM | POA: Diagnosis not present

## 2018-11-03 DIAGNOSIS — G4733 Obstructive sleep apnea (adult) (pediatric): Secondary | ICD-10-CM | POA: Diagnosis not present

## 2018-11-03 DIAGNOSIS — G40009 Localization-related (focal) (partial) idiopathic epilepsy and epileptic syndromes with seizures of localized onset, not intractable, without status epilepticus: Secondary | ICD-10-CM | POA: Diagnosis not present

## 2018-11-03 DIAGNOSIS — Z9989 Dependence on other enabling machines and devices: Secondary | ICD-10-CM | POA: Diagnosis not present

## 2018-11-12 DIAGNOSIS — M25572 Pain in left ankle and joints of left foot: Secondary | ICD-10-CM | POA: Diagnosis not present

## 2018-11-12 DIAGNOSIS — M170 Bilateral primary osteoarthritis of knee: Secondary | ICD-10-CM | POA: Diagnosis not present

## 2018-11-26 DIAGNOSIS — D485 Neoplasm of uncertain behavior of skin: Secondary | ICD-10-CM | POA: Diagnosis not present

## 2018-11-26 DIAGNOSIS — L659 Nonscarring hair loss, unspecified: Secondary | ICD-10-CM | POA: Diagnosis not present

## 2018-12-08 DIAGNOSIS — L639 Alopecia areata, unspecified: Secondary | ICD-10-CM | POA: Diagnosis not present

## 2018-12-21 DIAGNOSIS — H40023 Open angle with borderline findings, high risk, bilateral: Secondary | ICD-10-CM | POA: Diagnosis not present

## 2019-02-28 ENCOUNTER — Encounter (HOSPITAL_COMMUNITY): Payer: Self-pay

## 2019-02-28 ENCOUNTER — Emergency Department (HOSPITAL_COMMUNITY): Payer: Medicare Other

## 2019-02-28 ENCOUNTER — Emergency Department (HOSPITAL_COMMUNITY)
Admission: EM | Admit: 2019-02-28 | Discharge: 2019-02-28 | Disposition: A | Discharge status: Home / Self Care | Payer: Medicare Other | Attending: Emergency Medicine | Admitting: Emergency Medicine

## 2019-02-28 ENCOUNTER — Other Ambulatory Visit: Payer: Self-pay

## 2019-02-28 DIAGNOSIS — R42 Dizziness and giddiness: Secondary | ICD-10-CM | POA: Diagnosis not present

## 2019-02-28 DIAGNOSIS — R2689 Other abnormalities of gait and mobility: Secondary | ICD-10-CM | POA: Diagnosis not present

## 2019-02-28 DIAGNOSIS — R410 Disorientation, unspecified: Secondary | ICD-10-CM | POA: Insufficient documentation

## 2019-02-28 DIAGNOSIS — Z79899 Other long term (current) drug therapy: Secondary | ICD-10-CM | POA: Diagnosis not present

## 2019-02-28 DIAGNOSIS — R4182 Altered mental status, unspecified: Secondary | ICD-10-CM | POA: Diagnosis present

## 2019-02-28 LAB — CBC WITH DIFFERENTIAL/PLATELET
Abs Immature Granulocytes: 0 10*3/uL (ref 0.00–0.07)
Basophils Absolute: 0 10*3/uL (ref 0.0–0.1)
Basophils Relative: 1 %
Eosinophils Absolute: 0 10*3/uL (ref 0.0–0.5)
Eosinophils Relative: 1 %
HCT: 41.9 % (ref 36.0–46.0)
Hemoglobin: 13.9 g/dL (ref 12.0–15.0)
Immature Granulocytes: 0 %
Lymphocytes Relative: 31 %
Lymphs Abs: 1.6 10*3/uL (ref 0.7–4.0)
MCH: 30.5 pg (ref 26.0–34.0)
MCHC: 33.2 g/dL (ref 30.0–36.0)
MCV: 91.9 fL (ref 80.0–100.0)
Monocytes Absolute: 0.4 10*3/uL (ref 0.1–1.0)
Monocytes Relative: 8 %
Neutro Abs: 3.2 10*3/uL (ref 1.7–7.7)
Neutrophils Relative %: 59 %
Platelets: 172 10*3/uL (ref 150–400)
RBC: 4.56 MIL/uL (ref 3.87–5.11)
RDW: 13 % (ref 11.5–15.5)
WBC: 5.2 10*3/uL (ref 4.0–10.5)
nRBC: 0 % (ref 0.0–0.2)

## 2019-02-28 LAB — BASIC METABOLIC PANEL
Anion gap: 12 (ref 5–15)
BUN: 14 mg/dL (ref 8–23)
CO2: 27 mmol/L (ref 22–32)
Calcium: 9.5 mg/dL (ref 8.9–10.3)
Chloride: 101 mmol/L (ref 98–111)
Creatinine, Ser: 0.88 mg/dL (ref 0.44–1.00)
GFR calc Af Amer: >60 mL/min (ref >60)
GFR calc non Af Amer: >60 mL/min (ref >60)
Glucose, Bld: 93 mg/dL (ref 70–99)
Potassium: 4.2 mmol/L (ref 3.5–5.1)
Sodium: 140 mmol/L (ref 135–145)

## 2019-02-28 LAB — URINALYSIS, ROUTINE W REFLEX MICROSCOPIC
Bilirubin Urine: NEGATIVE
Glucose, UA: NEGATIVE mg/dL
Hgb urine dipstick: NEGATIVE
Ketones, ur: NEGATIVE mg/dL
Leukocytes,Ua: NEGATIVE
Nitrite: NEGATIVE
Protein, ur: NEGATIVE mg/dL
Specific Gravity, Urine: 1.003 — ABNORMAL LOW (ref 1.005–1.030)
pH: 6 (ref 5.0–8.0)

## 2019-02-28 MED ORDER — IOHEXOL 350 MG/ML SOLN
75.0000 mL | Freq: Once | INTRAVENOUS | Status: AC | PRN
  Administered 2019-02-28: 21:00:00 100 mL via INTRAVENOUS

## 2019-02-28 NOTE — Discharge Instructions (Signed)
Work-up today was reassuring.  Please follow-up with your neurologist.  Return the emergency department for any difficulty walking, numbness/weakness of your arms or legs, vision changes or any other worsening or concerning symptoms.

## 2019-02-28 NOTE — ED Triage Notes (Signed)
Pt from home with husband who states that the pt began having difficulty ambulating on a walk around 1130 and had confusion and answering questions inappropriately. Pt now back to normal. He called her dr and psychologist and it was recommended that pt come in for neurological eval. Axox4 and no neuro deficits at this time.

## 2019-02-28 NOTE — ED Provider Notes (Signed)
Care assumed from [facility], [place], at sign over of patient, please see their notes for full documentation of patient's complaint/HPI. Briefly, pt here with transient period of confusion and ataxia/difficulty walking earlier today, lasted for about an hour or so and then resolved. Has been ambulating fine here. Results so far show BMP WNL, CBC w/diff WNL, U/A unremarkable, CT head negative. Awaiting MRI brain and neuro consult (PA Layden already spoke with neurology, they will be down to see her). Plan is to reassess after MRI brain and neuro consult.   Physical Exam  BP 118/71 (BP Location: Right Arm)   Pulse 68   Temp (!) 97.5 F (36.4 C) (Oral)   Resp (!) 24   Ht 5\' 3"  (1.6 m)   Wt 60.3 kg   SpO2 100%   BMI 23.56 kg/m   Physical Exam Gen: afebrile, VSS, NAD HEENT: EOMI  Resp: no resp distress CV: rate WNL Abd: appearance normal, nondistended MsK: moving all extremities with ease Neuro: A&O x4  ED Course/Procedures     Results for orders placed or performed during the hospital encounter of 29/79/89  Basic metabolic panel  Result Value Ref Range   Sodium 140 135 - 145 mmol/L   Potassium 4.2 3.5 - 5.1 mmol/L   Chloride 101 98 - 111 mmol/L   CO2 27 22 - 32 mmol/L   Glucose, Bld 93 70 - 99 mg/dL   BUN 14 8 - 23 mg/dL   Creatinine, Ser 0.88 0.44 - 1.00 mg/dL   Calcium 9.5 8.9 - 10.3 mg/dL   GFR calc non Af Amer >60 >60 mL/min   GFR calc Af Amer >60 >60 mL/min   Anion gap 12 5 - 15  CBC with Differential  Result Value Ref Range   WBC 5.2 4.0 - 10.5 K/uL   RBC 4.56 3.87 - 5.11 MIL/uL   Hemoglobin 13.9 12.0 - 15.0 g/dL   HCT 41.9 36.0 - 46.0 %   MCV 91.9 80.0 - 100.0 fL   MCH 30.5 26.0 - 34.0 pg   MCHC 33.2 30.0 - 36.0 g/dL   RDW 13.0 11.5 - 15.5 %   Platelets 172 150 - 400 K/uL   nRBC 0.0 0.0 - 0.2 %   Neutrophils Relative % 59 %   Neutro Abs 3.2 1.7 - 7.7 K/uL   Lymphocytes Relative 31 %   Lymphs Abs 1.6 0.7 - 4.0 K/uL   Monocytes Relative 8 %   Monocytes  Absolute 0.4 0.1 - 1.0 K/uL   Eosinophils Relative 1 %   Eosinophils Absolute 0.0 0.0 - 0.5 K/uL   Basophils Relative 1 %   Basophils Absolute 0.0 0.0 - 0.1 K/uL   Immature Granulocytes 0 %   Abs Immature Granulocytes 0.00 0.00 - 0.07 K/uL  Urinalysis, Routine w reflex microscopic  Result Value Ref Range   Color, Urine STRAW (A) YELLOW   APPearance CLEAR CLEAR   Specific Gravity, Urine 1.003 (L) 1.005 - 1.030   pH 6.0 5.0 - 8.0   Glucose, UA NEGATIVE NEGATIVE mg/dL   Hgb urine dipstick NEGATIVE NEGATIVE   Bilirubin Urine NEGATIVE NEGATIVE   Ketones, ur NEGATIVE NEGATIVE mg/dL   Protein, ur NEGATIVE NEGATIVE mg/dL   Nitrite NEGATIVE NEGATIVE   Leukocytes,Ua NEGATIVE NEGATIVE   Ct Head Wo Contrast  Result Date: 02/28/2019 CLINICAL DATA:  Began having difficulty ambulating at 1130 hours, confusion, answering questions inappropriately, now back to normal EXAM: CT HEAD WITHOUT CONTRAST TECHNIQUE: Contiguous axial images were obtained from  the base of the skull through the vertex without intravenous contrast. Sagittal and coronal MPR images reconstructed from axial data set. COMPARISON:  10/02/2012 FINDINGS: Brain: Normal ventricular morphology. No midline shift or mass effect. Normal appearance of brain parenchyma. No intracranial hemorrhage, mass lesion or evidence acute infarction. No extra-axial fluid collections. Vascular: No hyperdense vessels Skull: Unremarkable Sinuses/Orbits: Clear Other: N/A IMPRESSION: No acute intracranial abnormalities. Electronically Signed   By: Lavonia Dana M.D.   On: 02/28/2019 16:35   Mr Brain Wo Contrast  Result Date: 02/28/2019 CLINICAL DATA:  63 y/o  F; episode of ataxia and confusion. EXAM: MRI HEAD WITHOUT CONTRAST TECHNIQUE: Multiplanar, multiecho pulse sequences of the brain and surrounding structures were obtained without intravenous contrast. COMPARISON:  02/28/2019 CT head. FINDINGS: Brain: No acute infarction, hemorrhage, hydrocephalus, extra-axial  collection or mass lesion. Few punctate nonspecific T2 FLAIR hyperintensities in subcortical and periventricular white matter are compatible with mild chronic microvascular ischemic changes. Mild volume loss of the brain. Vascular: Normal flow voids. Skull and upper cervical spine: Normal marrow signal. Sinuses/Orbits: Negative. Other: None. IMPRESSION: 1. No acute intracranial abnormality identified. 2. Mild chronic microvascular ischemic changes and volume loss of the brain. Electronically Signed   By: Kristine Garbe M.D.   On: 02/28/2019 20:50     Meds ordered this encounter  Medications  . iohexol (OMNIPAQUE) 350 MG/ML injection 75 mL     MDM: No diagnosis found.   9:02 PM MRI brain neg for acute findings. Neuro consultant Dr. Leonel Ramsay spoke with Dr. Regenia Skeeter and plan was for her to undergo CTA head/neck. Patient care to be resumed by [facility] PA-C at shift change sign-out. Please see their notes for further documentation of pending results and dispo/care. Pt stable at sign-out and updated on transfer back of care.    [address] [place], [place] 02/28/19 2102    Sherwood Gambler, MD 03/03/19 1147

## 2019-02-28 NOTE — ED Provider Notes (Signed)
[facility] EMERGENCY DEPARTMENT Provider Note   CSN: [id number] Arrival date & time: 02/28/19  1449    History   Chief Complaint Chief Complaint  Patient presents with   Difficulty Walking   Altered Mental Status    HPI Andrea Crawford is a 63 y.o. female with PMH/o seizures who presents for evaluation feeling off balance and confusion that began at 11:30 AM today.  Patient states she was walking with her husband and states that she started feeling slightly off balance.  She states that she thinks it was because her left lower extremity started hurting.  She reports that she went back home and her husband was concerned so brought her to emergency department.  Her husband reports that they went out for a walk at about 11:30 AM.  He noticed that on the walk, patient was having to hold onto the fence and appeared slightly wobbly.  He states that they walked back to the house and during the entire time, she had to hold onto his arm.  He states that she sat down on the porch and he was asking her questions such as "what time is it?"  She was not answering them correctly.  He states that she never had any slurred speech or facial droop.  They called her psychiatrist who prompted them to go to the emergency department for further evaluation.  Husband reports that the symptoms lasted for about an hour or 2 and then resolved.  He reports that by the time she he brought her to the emergency department, she was back to her baseline.  Patient states that she took her normal dose of seizure medications today.  Husband thinks that this is true.  He does report she has a history of sometimes taking an extra dose because she thinks she forgot to take her medication.  He does state that when this happens, she does appear slightly off balance.  Patient states she feels back to normal now.  She denies any vision changes, slurred speech, numbness/weakness of her arms or legs, chest pain, difficulty  breathing, abdominal pain, nausea/vomiting.      The history is provided by the patient.    Past Medical History:  Diagnosis Date   Seizures ([place])     There are no active problems to display for this patient.   The histories are not reviewed yet. Please review them in the "History" navigator section and refresh this [place].   OB History   No obstetric history on file.      Home Medications    Prior to Admission medications   Medication Sig Start Date End Date Taking? Authorizing Provider  acetaminophen (TYLENOL) 500 MG tablet Take 1,000 mg by mouth every 6 (six) hours as needed for headache (pain).   Yes [provider]  amantadine (SYMMETREL) 100 MG capsule Take 100 mg by mouth daily. 01/10/19  Yes [provider]  ARIPiprazole (ABILIFY) 10 MG tablet Take 10 mg by mouth daily.    Yes [provider]  Ascorbic Acid (VITAMIN C) 1000 MG tablet Take 1,000 mg by mouth 2 (two) times a day.   Yes [provider]  cholecalciferol (VITAMIN D3) 25 MCG (1000 UT) tablet Take 1,000 Units by mouth daily as needed (sunshine replacement).   Yes [provider]  Clobazam (ONFI) 10 MG TABS Take 5 mg by mouth 2 (two) times daily.    Yes [provider]  lacosamide (VIMPAT) 50 MG TABS tablet  Take 50-150 mg by mouth See admin instructions. Take 1 tablet (50 mg) by mouth with breakfast and lunch, take 3 tablets (150 mg) at bedtime   Yes [provider]  lamoTRIgine (LAMICTAL) 200 MG tablet Take 200-400 mg by mouth See admin instructions. Take one tablet (200 mg) by mouth with breakfast and lunch, take two tablets (400 mg) at bedtime   Yes [provider]  levETIRAcetam (KEPPRA) 1000 MG tablet Take 500-1,000 mg by mouth See admin instructions. Take 1/2 tablet (500 mg) by mouth with breakfast and lunch, take 1 tablet (1000 mg) at bedtime   Yes [provider]  meloxicam (MOBIC) 15 MG tablet Take 15 mg by mouth at  bedtime as needed for pain.  01/15/19  Yes [provider]  Multiple Vitamins-Minerals (ZINC PO) Take 2 tablets by mouth daily with lunch.   Yes [provider]  omeprazole (PRILOSEC) 40 MG capsule Take 40 mg by mouth at bedtime. 01/10/19  Yes [provider]  [place] into the lungs at bedtime. CPAP   Yes [provider]  pyridOXINE (VITAMIN B-6) 100 MG tablet Take 600 mg by mouth daily.   Yes [provider]    Family History History reviewed. No pertinent family history.  Social History Social History   Tobacco Use   Smoking status: Never Smoker  Substance Use Topics   Alcohol use: No   Drug use: No     Allergies   Patient has no known allergies.   Review of Systems Review of Systems  Constitutional: Negative for fever.  Respiratory: Negative for cough and shortness of breath.   Cardiovascular: Negative for chest pain.  Gastrointestinal: Negative for abdominal pain, nausea and vomiting.  Genitourinary: Negative for dysuria and hematuria.  Neurological: Positive for dizziness. Negative for headaches.  Psychiatric/Behavioral: Positive for confusion.  All other systems reviewed and are negative.    Physical Exam Updated Vital Signs BP 128/73    Pulse 73    Temp (!) 97.5 F (36.4 C) (Oral)    Resp 19    Ht 5\' 3"  (1.6 m)    Wt 60.3 kg    SpO2 100%    BMI 23.56 kg/m   Physical Exam Vitals signs and nursing note reviewed.  Constitutional:      Appearance: Normal appearance. She is well-developed.  HENT:     Head: Normocephalic and atraumatic.  Eyes:     General: Lids are normal.     Extraocular Movements:     Right eye: No nystagmus.     Left eye: No nystagmus.     Conjunctiva/sclera: Conjunctivae normal.     Pupils: Pupils are equal, round, and reactive to light.     Comments: EOMs intact. No nystagmus.   Neck:     Musculoskeletal: Full passive range of motion without pain.  Cardiovascular:      Rate and Rhythm: Normal rate and regular rhythm.     Pulses: Normal pulses.          Radial pulses are 2+ on the right side and 2+ on the left side.       Dorsalis pedis pulses are 2+ on the right side and 2+ on the left side.     Heart sounds: Normal heart sounds. No murmur. No friction rub. No gallop.   Pulmonary:     Effort: Pulmonary effort is normal.     Breath sounds: Normal breath sounds.     Comments: Lungs clear to auscultation  bilaterally.  Symmetric chest rise.  No wheezing, rales, rhonchi. Abdominal:     Palpations: Abdomen is soft. Abdomen is not rigid.     Tenderness: There is no abdominal tenderness. There is no guarding.     Comments: Abdomen is soft, non-distended, non-tender. No rigidity, No guarding. No peritoneal signs.  Musculoskeletal: Normal range of motion.  Skin:    General: Skin is warm and dry.     Capillary Refill: Capillary refill takes less than 2 seconds.  Neurological:     Mental Status: She is alert and oriented to person, place, and time.     Comments: Cranial nerves III-XII intact Follows commands, Moves all extremities  5/5 strength to BUE and BLE  Sensation intact throughout all major nerve distributions No pronator drift. No gait abnormalities  No slurred speech. No facial droop.   Psychiatric:        Speech: Speech normal.      ED Treatments / Results  Labs (all labs ordered are listed, but only abnormal results are displayed) Labs Reviewed  URINALYSIS, ROUTINE W REFLEX MICROSCOPIC - Abnormal; Notable for the following components:      Result Value   Color, Urine STRAW (*)    Specific Gravity, Urine 1.003 (*)    All other components within normal limits  BASIC METABOLIC PANEL  CBC WITH DIFFERENTIAL/PLATELET  LAMOTRIGINE LEVEL  LEVETIRACETAM LEVEL    EKG EKG Interpretation  Date/Time:  Sunday Feb 28 2019 15:34:24 EDT Ventricular Rate:  69 PR Interval:    QRS Duration: 89 QT Interval:  408 QTC Calculation: 438 R  Axis:   39 Text Interpretation:  Sinus rhythm Abnormal R-wave progression, early transition no acute ST/T changes no significant change since 2013 Confirmed by Sherwood Gambler [phone]) on 02/28/2019 3:36:45 PM   Radiology Ct Angio Head W Or Wo Contrast  Result Date: 02/28/2019 CLINICAL DATA:  63 y/o F; episode of difficulty ambulating and confusion. EXAM: CT ANGIOGRAPHY HEAD AND NECK TECHNIQUE: Multidetector CT imaging of the head and neck was performed using the standard protocol during bolus administration of intravenous contrast. Multiplanar CT image reconstructions and MIPs were obtained to evaluate the vascular anatomy. Carotid stenosis measurements (when applicable) are obtained utilizing NASCET criteria, using the distal internal carotid diameter as the denominator. CONTRAST:  163mL OMNIPAQUE IOHEXOL 350 MG/ML SOLN COMPARISON:  02/28/2019 CT head and MRI head. FINDINGS: CTA NECK FINDINGS Aortic arch: Standard branching. Imaged portion shows no evidence of aneurysm or dissection. No significant stenosis of the major arch vessel origins. Mild calcific atherosclerosis of the aortic arch. Right carotid system: No evidence of dissection, stenosis (50% or greater) or occlusion. Left carotid system: No evidence of dissection, stenosis (50% or greater) or occlusion. Vertebral arteries: Right dominant. No evidence of dissection, stenosis (50% or greater) or occlusion. Skeleton: No acute finding. Mild cervical spondylosis with multilevel disc and facet degenerative changes greatest at the C4-5 and C6-7 levels. No high-grade bony spinal canal stenosis. Other neck: Negative. Upper chest: Negative. Review of the MIP images confirms the above findings CTA HEAD FINDINGS Anterior circulation: No significant stenosis, proximal occlusion, aneurysm, or vascular malformation. Mild calcific atherosclerosis of the carotid siphons without stenosis. Posterior circulation: No significant stenosis, proximal occlusion, aneurysm, or  vascular malformation. Venous sinuses: As permitted by contrast timing, patent. Anatomic variants: Bilateral fetal PCA. Small anterior communicating artery. Delayed phase: No abnormal intracranial enhancement. Review of the MIP images confirms the above findings IMPRESSION: 1. Patent carotid and vertebral arteries. No dissection, aneurysm, or  hemodynamically significant stenosis utilizing NASCET criteria. 2. Patent anterior and posterior intracranial circulation. No large vessel occlusion, aneurysm, or significant stenosis. 3. Mild non stenotic calcific atherosclerosis of aortic arch and carotid siphons. Electronically Signed   By: Kristine Garbe M.D.   On: 02/28/2019 21:27   Ct Head Wo Contrast  Result Date: 02/28/2019 CLINICAL DATA:  Began having difficulty ambulating at 1130 hours, confusion, answering questions inappropriately, now back to normal EXAM: CT HEAD WITHOUT CONTRAST TECHNIQUE: Contiguous axial images were obtained from the base of the skull through the vertex without intravenous contrast. Sagittal and coronal MPR images reconstructed from axial data set. COMPARISON:  10/02/2012 FINDINGS: Brain: Normal ventricular morphology. No midline shift or mass effect. Normal appearance of brain parenchyma. No intracranial hemorrhage, mass lesion or evidence acute infarction. No extra-axial fluid collections. Vascular: No hyperdense vessels Skull: Unremarkable Sinuses/Orbits: Clear Other: N/A IMPRESSION: No acute intracranial abnormalities. Electronically Signed   By: Lavonia Dana M.D.   On: 02/28/2019 16:35   Ct Angio Neck W And/or Wo Contrast  Result Date: 02/28/2019 CLINICAL DATA:  63 y/o F; episode of difficulty ambulating and confusion. EXAM: CT ANGIOGRAPHY HEAD AND NECK TECHNIQUE: Multidetector CT imaging of the head and neck was performed using the standard protocol during bolus administration of intravenous contrast. Multiplanar CT image reconstructions and MIPs were obtained to evaluate  the vascular anatomy. Carotid stenosis measurements (when applicable) are obtained utilizing NASCET criteria, using the distal internal carotid diameter as the denominator. CONTRAST:  157mL OMNIPAQUE IOHEXOL 350 MG/ML SOLN COMPARISON:  02/28/2019 CT head and MRI head. FINDINGS: CTA NECK FINDINGS Aortic arch: Standard branching. Imaged portion shows no evidence of aneurysm or dissection. No significant stenosis of the major arch vessel origins. Mild calcific atherosclerosis of the aortic arch. Right carotid system: No evidence of dissection, stenosis (50% or greater) or occlusion. Left carotid system: No evidence of dissection, stenosis (50% or greater) or occlusion. Vertebral arteries: Right dominant. No evidence of dissection, stenosis (50% or greater) or occlusion. Skeleton: No acute finding. Mild cervical spondylosis with multilevel disc and facet degenerative changes greatest at the C4-5 and C6-7 levels. No high-grade bony spinal canal stenosis. Other neck: Negative. Upper chest: Negative. Review of the MIP images confirms the above findings CTA HEAD FINDINGS Anterior circulation: No significant stenosis, proximal occlusion, aneurysm, or vascular malformation. Mild calcific atherosclerosis of the carotid siphons without stenosis. Posterior circulation: No significant stenosis, proximal occlusion, aneurysm, or vascular malformation. Venous sinuses: As permitted by contrast timing, patent. Anatomic variants: Bilateral fetal PCA. Small anterior communicating artery. Delayed phase: No abnormal intracranial enhancement. Review of the MIP images confirms the above findings IMPRESSION: 1. Patent carotid and vertebral arteries. No dissection, aneurysm, or hemodynamically significant stenosis utilizing NASCET criteria. 2. Patent anterior and posterior intracranial circulation. No large vessel occlusion, aneurysm, or significant stenosis. 3. Mild non stenotic calcific atherosclerosis of aortic arch and carotid siphons.  Electronically Signed   By: Kristine Garbe M.D.   On: 02/28/2019 21:27   Mr Brain Wo Contrast  Result Date: 02/28/2019 CLINICAL DATA:  63 y/o  F; episode of ataxia and confusion. EXAM: MRI HEAD WITHOUT CONTRAST TECHNIQUE: Multiplanar, multiecho pulse sequences of the brain and surrounding structures were obtained without intravenous contrast. COMPARISON:  02/28/2019 CT head. FINDINGS: Brain: No acute infarction, hemorrhage, hydrocephalus, extra-axial collection or mass lesion. Few punctate nonspecific T2 FLAIR hyperintensities in subcortical and periventricular white matter are compatible with mild chronic microvascular ischemic changes. Mild volume loss of the brain. Vascular: Normal flow voids.  Skull and upper cervical spine: Normal marrow signal. Sinuses/Orbits: Negative. Other: None. IMPRESSION: 1. No acute intracranial abnormality identified. 2. Mild chronic microvascular ischemic changes and volume loss of the brain. Electronically Signed   By: Kristine Garbe M.D.   On: 02/28/2019 20:50    Procedures Procedures (including critical care time)  Medications Ordered in ED Medications  iohexol (OMNIPAQUE) 350 MG/ML injection 75 mL (100 mLs Intravenous Contrast Given 02/28/19 2104)     Initial Impression / Assessment and Plan / ED Course  I have reviewed the triage vital signs and the nursing notes.  Pertinent labs & imaging results that were available during my care of the patient were reviewed by me and considered in my medical decision making (see chart for details).        63 year old female who presents for of confusion and dizziness that occurred 11:30 AM today.  Patient states that she was walking with her husband and felt slightly off balance.  She thinks it may have been due to her leg starting to hurt.  Per husband, she started getting wobbly and was having to hold onto the fence which is abnormal for her.  He states that when they got home, she was not answering  questions appropriately.  He states that this lasted for about an hour or so and then resolved.  He called her psychiatrist who recommended that he come to the emergency department.  On ED arrival, patient states that the symptoms have completely resolved.  She states she did not have any difficulty speaking, weakness of her extremities, vision change during this time. Patient is afebrile, non-toxic appearing, sitting comfortably on examination table. Vital signs reviewed and stable. No neuro deficits noted on exam.  Plan to check labs, head CT.  CBC is unremarkable.  BMP is unremarkable.  CT head negative for any acute abnormalities.  UA negative for any infectious etiology.  I personally ambulated patient in the department.  She showed no evidence of gait ataxia and states that she did not have any dizziness.  Vision symptoms, will plan to consult neuro for further evaluation.  Discussed patient with Dr. Lorraine Lax (Neuro). Recommends MRI brain. If abnormal, plan to consult neuro. If normal patient can be reasonably discharged home.   Patient signed out to [facility], PA-C with MRI pending.   MRI reviewed.  No evidence of acute intracranial abnormality noted.  No evidence of acute CVA.  Dr. Regenia Skeeter discussed with Dr. Leonel Ramsay (Neuro) after MRI.  Recommends CTA of head and neck for evaluation of any acute abnormality.  If unremarkable, patient be discharged home with follow-up with outpatient neuro.  CT head and neck show no acute abnormalities.  There is patent carotid, vertebral arteries.  No evidence of dissection, aneurysm.  Patient has mild nonstenotic calcification arthrosclerosis of aortic arch and carotid siphons.  Patient with patent anterior, posterior intracranial circulation.   Reevaluation.  Patient's vitals stable.  She is ambulating in the part without any difficulty.  She ambulates with no help.  She answers all my questions appropriately.  At this time, patient is stable for  discharge.  Discussed with both patient and husband via telephone.  Instructed patient to follow-up with outpatient neurology. Discussed patient with Dr. Regenia Skeeter who is agreeable to plan. At this time, patient exhibits no emergent life-threatening condition that require further evaluation in ED or admission. Patient had ample opportunity for questions and discussion. All patient's questions were answered with full understanding. Strict return precautions discussed. Patient expresses  understanding and agreement to plan.   Portions of this note were generated with [employer]. Dictation errors may occur despite best attempts at proofreading.   Final Clinical Impressions(s) / ED Diagnoses   Final diagnoses:  Dizziness    ED Discharge Orders    None       Volanda Napoleon, PA-C 02/28/19 2345    Sherwood Gambler, MD 03/03/19 1148

## 2019-03-02 LAB — LEVETIRACETAM LEVEL: Levetiracetam Lvl: 28.6 ug/mL (ref 10.0–40.0)

## 2019-03-03 LAB — LAMOTRIGINE LEVEL: Lamotrigine Lvl: 21.6 ug/mL — ABNORMAL HIGH (ref 2.0–20.0)

## 2019-05-19 DIAGNOSIS — L639 Alopecia areata, unspecified: Secondary | ICD-10-CM | POA: Diagnosis not present

## 2019-06-24 DIAGNOSIS — K219 Gastro-esophageal reflux disease without esophagitis: Secondary | ICD-10-CM | POA: Diagnosis not present

## 2019-06-29 DIAGNOSIS — H40023 Open angle with borderline findings, high risk, bilateral: Secondary | ICD-10-CM | POA: Diagnosis not present

## 2019-07-06 DIAGNOSIS — L639 Alopecia areata, unspecified: Secondary | ICD-10-CM | POA: Diagnosis not present

## 2019-07-06 DIAGNOSIS — L820 Inflamed seborrheic keratosis: Secondary | ICD-10-CM | POA: Diagnosis not present

## 2019-07-09 DIAGNOSIS — Z0000 Encounter for general adult medical examination without abnormal findings: Secondary | ICD-10-CM | POA: Diagnosis not present

## 2019-07-09 DIAGNOSIS — G40909 Epilepsy, unspecified, not intractable, without status epilepticus: Secondary | ICD-10-CM | POA: Diagnosis not present

## 2019-07-09 DIAGNOSIS — G4733 Obstructive sleep apnea (adult) (pediatric): Secondary | ICD-10-CM | POA: Diagnosis not present

## 2019-07-09 DIAGNOSIS — Z23 Encounter for immunization: Secondary | ICD-10-CM | POA: Diagnosis not present

## 2019-07-09 DIAGNOSIS — Z79899 Other long term (current) drug therapy: Secondary | ICD-10-CM | POA: Diagnosis not present

## 2019-07-09 DIAGNOSIS — Z1159 Encounter for screening for other viral diseases: Secondary | ICD-10-CM | POA: Diagnosis not present

## 2019-07-09 DIAGNOSIS — G2119 Other drug induced secondary parkinsonism: Secondary | ICD-10-CM | POA: Diagnosis not present

## 2019-07-09 DIAGNOSIS — S51812A Laceration without foreign body of left forearm, initial encounter: Secondary | ICD-10-CM | POA: Diagnosis not present

## 2019-07-09 DIAGNOSIS — M8580 Other specified disorders of bone density and structure, unspecified site: Secondary | ICD-10-CM | POA: Diagnosis not present

## 2019-07-09 DIAGNOSIS — F29 Unspecified psychosis not due to a substance or known physiological condition: Secondary | ICD-10-CM | POA: Diagnosis not present

## 2019-07-09 DIAGNOSIS — E785 Hyperlipidemia, unspecified: Secondary | ICD-10-CM | POA: Diagnosis not present

## 2019-08-01 IMAGING — CT CT ANGIOGRAPHY NECK
1 of 8 series · 6 of 33 positions shown · IV contrast (omnipaque)
Comparison: 02/28/2019 CT head and MRI head.

CLINICAL DATA: 62 y/o F; episode of difficulty ambulating and
confusion.

EXAM:
CT ANGIOGRAPHY HEAD AND NECK
TECHNIQUE: Multidetector CT imaging of the head and neck was performed using
the standard protocol during bolus administration of intravenous
contrast. Multiplanar CT image reconstructions and MIPs were
obtained to evaluate the vascular anatomy. Carotid stenosis
measurements (when applicable) are obtained utilizing NASCET
criteria, using the distal internal carotid diameter as the
denominator.
CONTRAST:  100mL OMNIPAQUE IOHEXOL 350 MG/ML SOLN

[Series 8: ax thins · axial · 0.34mm/px · z∈[+710,+945]mm · 6 of 331 slices shown]
[im 48/331  soft-tissue]
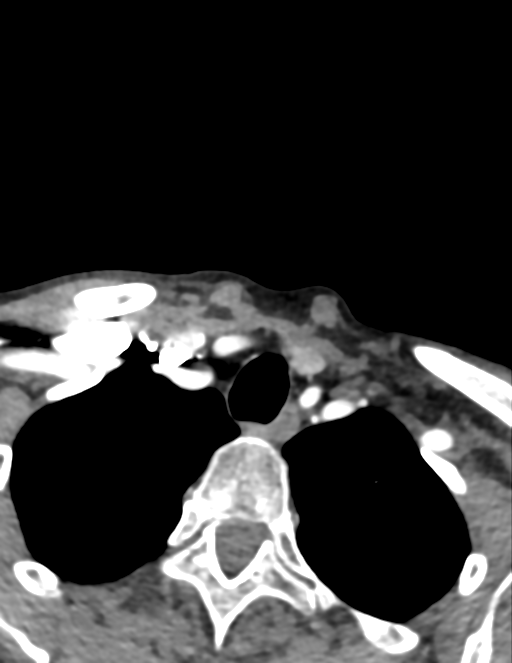
[im 95/331  bone]
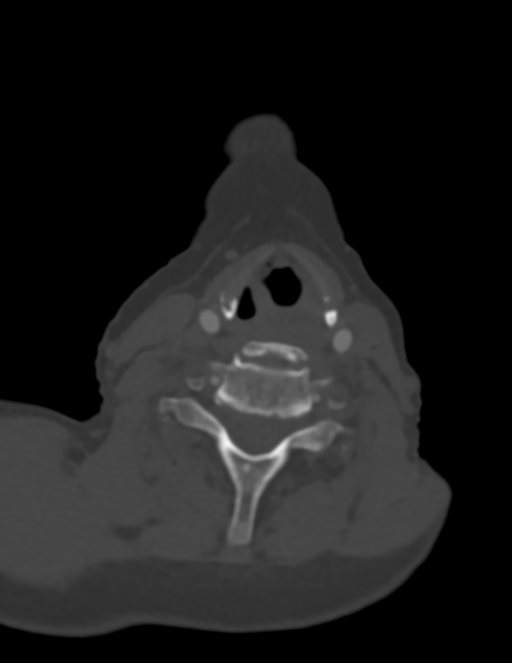
[im 142/331  soft-tissue]
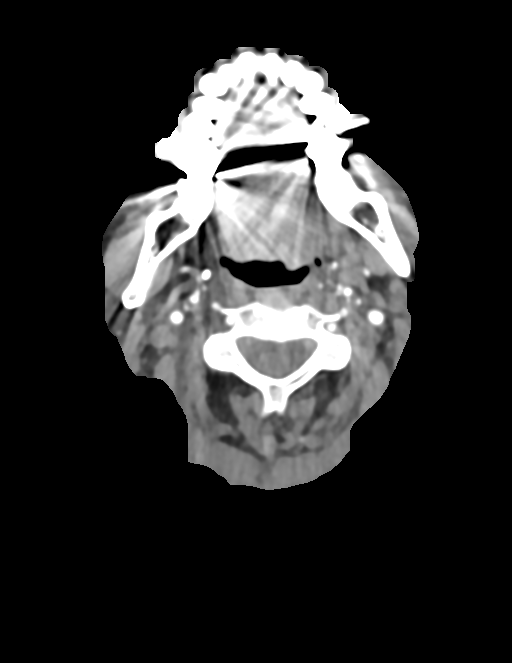
[im 189/331  bone]
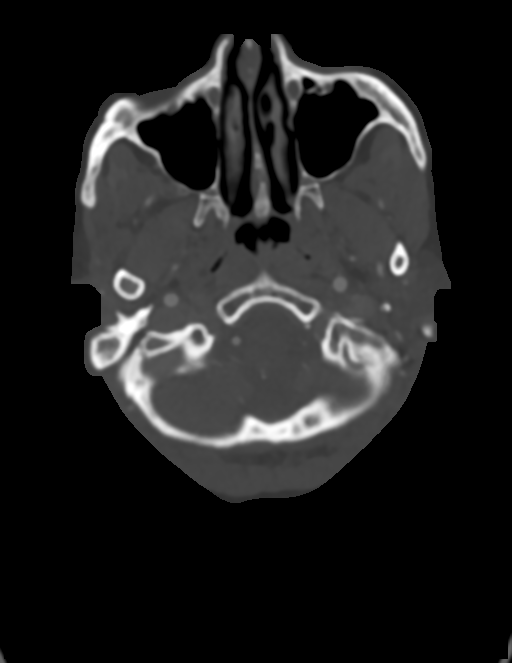
[im 236/331  soft-tissue]
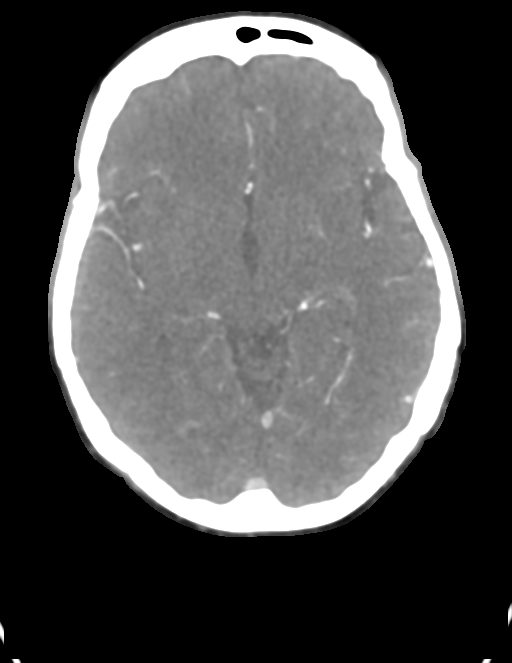
[im 283/331  bone]
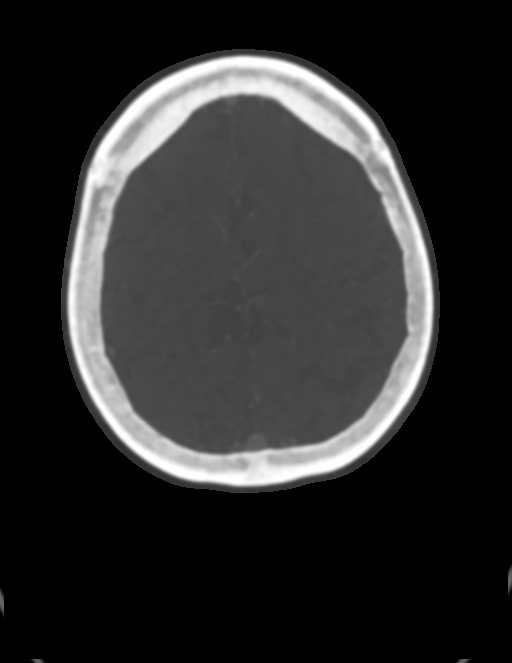

[6 of 33 positions shown; findings below may reference images not displayed]

FINDINGS: CTA NECK FINDINGS

Aortic arch: Standard branching. Imaged portion shows no evidence of
aneurysm or dissection. No significant stenosis of the major arch
vessel origins. Mild calcific atherosclerosis of the aortic arch.

Right carotid system: No evidence of dissection, stenosis (50% or
greater) or occlusion.

Left carotid system: No evidence of dissection, stenosis (50% or
greater) or occlusion.

Vertebral arteries: Right dominant. No evidence of dissection,
stenosis (50% or greater) or occlusion.

Skeleton: No acute finding. Mild cervical spondylosis with
multilevel disc and facet degenerative changes greatest at the C4-5
and C6-7 levels. No high-grade bony spinal canal stenosis.

Other neck: Negative.

Upper chest: Negative.

Review of the MIP images confirms the above findings

CTA HEAD FINDINGS

Anterior circulation: No significant stenosis, proximal occlusion,
aneurysm, or vascular malformation. Mild calcific atherosclerosis of
the carotid siphons without stenosis.

Posterior circulation: No significant stenosis, proximal occlusion,
aneurysm, or vascular malformation.

Venous sinuses: As permitted by contrast timing, patent.

Anatomic variants: Bilateral fetal PCA. Small anterior communicating
artery.

Delayed phase: No abnormal intracranial enhancement.

Review of the MIP images confirms the above findings
IMPRESSION: 1. Patent carotid and vertebral arteries. No dissection, aneurysm,
or hemodynamically significant stenosis utilizing NASCET criteria.
2. Patent anterior and posterior intracranial circulation. No large
vessel occlusion, aneurysm, or significant stenosis.
3. Mild non stenotic calcific atherosclerosis of aortic arch and
carotid siphons.

## 2019-08-24 DIAGNOSIS — Z23 Encounter for immunization: Secondary | ICD-10-CM | POA: Diagnosis not present

## 2019-08-24 DIAGNOSIS — L650 Telogen effluvium: Secondary | ICD-10-CM | POA: Diagnosis not present

## 2019-08-24 DIAGNOSIS — L821 Other seborrheic keratosis: Secondary | ICD-10-CM | POA: Diagnosis not present

## 2019-09-10 DIAGNOSIS — G40209 Localization-related (focal) (partial) symptomatic epilepsy and epileptic syndromes with complex partial seizures, not intractable, without status epilepticus: Secondary | ICD-10-CM | POA: Diagnosis not present

## 2019-11-01 DIAGNOSIS — H40023 Open angle with borderline findings, high risk, bilateral: Secondary | ICD-10-CM | POA: Diagnosis not present

## 2019-11-18 ENCOUNTER — Ambulatory Visit: Payer: Medicare Other | Attending: Internal Medicine

## 2019-11-18 DIAGNOSIS — Z23 Encounter for immunization: Secondary | ICD-10-CM | POA: Insufficient documentation

## 2019-11-18 NOTE — Progress Notes (Signed)
   Covid-19 Vaccination Clinic  Name:  Andrea Crawford    MRN: [id number] DOB: 08-25-1956  11/18/2019  [name] was observed post Covid-19 immunization for 15 minutes without incidence. She was provided with Vaccine Information Sheet and instruction to access the V-Safe system.   [name] was instructed to call 911 with any severe reactions post vaccine: Marland Kitchen Difficulty breathing  . Swelling of your face and throat  . A fast heartbeat  . A bad rash all over your body  . Dizziness and weakness    Immunizations Administered    Name Date Dose VIS Date Route   Pfizer COVID-19 Vaccine 11/18/2019  4:08 PM 0.3 mL 10/08/2019 Intramuscular   Manufacturer: [place]   Lot: [id number]   [place]: [id number]

## 2019-11-26 DIAGNOSIS — M1990 Unspecified osteoarthritis, unspecified site: Secondary | ICD-10-CM | POA: Diagnosis not present

## 2019-11-26 DIAGNOSIS — E785 Hyperlipidemia, unspecified: Secondary | ICD-10-CM | POA: Diagnosis not present

## 2019-11-26 DIAGNOSIS — M8580 Other specified disorders of bone density and structure, unspecified site: Secondary | ICD-10-CM | POA: Diagnosis not present

## 2019-11-29 DIAGNOSIS — Z1231 Encounter for screening mammogram for malignant neoplasm of breast: Secondary | ICD-10-CM | POA: Diagnosis not present

## 2019-11-29 DIAGNOSIS — Z124 Encounter for screening for malignant neoplasm of cervix: Secondary | ICD-10-CM | POA: Diagnosis not present

## 2019-11-29 DIAGNOSIS — Z6825 Body mass index (BMI) 25.0-25.9, adult: Secondary | ICD-10-CM | POA: Diagnosis not present

## 2019-12-10 ENCOUNTER — Ambulatory Visit: Payer: Medicare Other | Attending: Internal Medicine

## 2019-12-10 DIAGNOSIS — Z23 Encounter for immunization: Secondary | ICD-10-CM | POA: Insufficient documentation

## 2019-12-10 NOTE — Progress Notes (Signed)
   Covid-19 Vaccination Clinic  Name:  Andrea Crawford    MRN: [id number] DOB: [birth year]  12/10/2019  [name] was observed post Covid-19 immunization for 15 minutes without incidence. She was provided with Vaccine Information Sheet and instruction to access the V-Safe system.   [name] was instructed to call 911 with any severe reactions post vaccine: Marland Kitchen Difficulty breathing  . Swelling of your face and throat  . A fast heartbeat  . A bad rash all over your body  . Dizziness and weakness    Immunizations Administered    Name Date Dose VIS Date Route   Pfizer COVID-19 Vaccine 12/10/2019  2:40 PM 0.3 mL 10/08/2019 Intramuscular   Manufacturer: [place]   Lot: [id number]   [place]: [id number]

## 2020-01-07 DIAGNOSIS — M1712 Unilateral primary osteoarthritis, left knee: Secondary | ICD-10-CM | POA: Diagnosis not present

## 2020-02-01 DIAGNOSIS — Z9989 Dependence on other enabling machines and devices: Secondary | ICD-10-CM | POA: Diagnosis not present

## 2020-02-01 DIAGNOSIS — R569 Unspecified convulsions: Secondary | ICD-10-CM | POA: Diagnosis not present

## 2020-02-01 DIAGNOSIS — G4733 Obstructive sleep apnea (adult) (pediatric): Secondary | ICD-10-CM | POA: Diagnosis not present

## 2020-02-01 DIAGNOSIS — F28 Other psychotic disorder not due to a substance or known physiological condition: Secondary | ICD-10-CM | POA: Diagnosis not present

## 2020-02-01 DIAGNOSIS — R251 Tremor, unspecified: Secondary | ICD-10-CM | POA: Diagnosis not present

## 2020-02-01 DIAGNOSIS — G40909 Epilepsy, unspecified, not intractable, without status epilepticus: Secondary | ICD-10-CM | POA: Diagnosis not present

## 2020-02-29 DIAGNOSIS — H40023 Open angle with borderline findings, high risk, bilateral: Secondary | ICD-10-CM | POA: Diagnosis not present

## 2020-04-27 DIAGNOSIS — D649 Anemia, unspecified: Secondary | ICD-10-CM

## 2020-04-27 HISTORY — DX: Anemia, unspecified: D64.9

## 2020-05-18 DIAGNOSIS — R0989 Other specified symptoms and signs involving the circulatory and respiratory systems: Secondary | ICD-10-CM | POA: Diagnosis not present

## 2020-05-18 DIAGNOSIS — H6590 Unspecified nonsuppurative otitis media, unspecified ear: Secondary | ICD-10-CM | POA: Diagnosis not present

## 2020-05-18 DIAGNOSIS — R011 Cardiac murmur, unspecified: Secondary | ICD-10-CM | POA: Diagnosis not present

## 2020-05-19 ENCOUNTER — Other Ambulatory Visit (HOSPITAL_COMMUNITY): Payer: Self-pay | Admitting: Family Medicine

## 2020-05-19 DIAGNOSIS — R011 Cardiac murmur, unspecified: Secondary | ICD-10-CM

## 2020-05-19 DIAGNOSIS — R0989 Other specified symptoms and signs involving the circulatory and respiratory systems: Secondary | ICD-10-CM

## 2020-05-29 ENCOUNTER — Inpatient Hospital Stay (HOSPITAL_COMMUNITY)
Admission: EM | Admit: 2020-05-29 | Discharge: 2020-06-05 | DRG: 809 | Disposition: A | Discharge status: Home Health | Payer: Medicare Other | Attending: Internal Medicine | Admitting: Internal Medicine

## 2020-05-29 ENCOUNTER — Other Ambulatory Visit: Payer: Self-pay

## 2020-05-29 ENCOUNTER — Emergency Department (HOSPITAL_COMMUNITY): Payer: Medicare Other

## 2020-05-29 DIAGNOSIS — D61818 Other pancytopenia: Principal | ICD-10-CM | POA: Diagnosis present

## 2020-05-29 DIAGNOSIS — J90 Pleural effusion, not elsewhere classified: Secondary | ICD-10-CM | POA: Diagnosis not present

## 2020-05-29 DIAGNOSIS — G4730 Sleep apnea, unspecified: Secondary | ICD-10-CM | POA: Diagnosis not present

## 2020-05-29 DIAGNOSIS — I340 Nonrheumatic mitral (valve) insufficiency: Secondary | ICD-10-CM | POA: Diagnosis not present

## 2020-05-29 DIAGNOSIS — Z9114 Patient's other noncompliance with medication regimen: Secondary | ICD-10-CM | POA: Diagnosis not present

## 2020-05-29 DIAGNOSIS — D7381 Neutropenic splenomegaly: Secondary | ICD-10-CM

## 2020-05-29 DIAGNOSIS — E8770 Fluid overload, unspecified: Secondary | ICD-10-CM | POA: Diagnosis present

## 2020-05-29 DIAGNOSIS — I509 Heart failure, unspecified: Secondary | ICD-10-CM

## 2020-05-29 DIAGNOSIS — R0989 Other specified symptoms and signs involving the circulatory and respiratory systems: Secondary | ICD-10-CM | POA: Diagnosis not present

## 2020-05-29 DIAGNOSIS — R600 Localized edema: Secondary | ICD-10-CM | POA: Diagnosis not present

## 2020-05-29 DIAGNOSIS — R05 Cough: Secondary | ICD-10-CM | POA: Diagnosis not present

## 2020-05-29 DIAGNOSIS — R471 Dysarthria and anarthria: Secondary | ICD-10-CM | POA: Diagnosis not present

## 2020-05-29 DIAGNOSIS — D72819 Decreased white blood cell count, unspecified: Secondary | ICD-10-CM | POA: Diagnosis present

## 2020-05-29 DIAGNOSIS — F329 Major depressive disorder, single episode, unspecified: Secondary | ICD-10-CM | POA: Diagnosis present

## 2020-05-29 DIAGNOSIS — G40909 Epilepsy, unspecified, not intractable, without status epilepticus: Secondary | ICD-10-CM

## 2020-05-29 DIAGNOSIS — G4733 Obstructive sleep apnea (adult) (pediatric): Secondary | ICD-10-CM | POA: Diagnosis not present

## 2020-05-29 DIAGNOSIS — R609 Edema, unspecified: Secondary | ICD-10-CM

## 2020-05-29 DIAGNOSIS — F209 Schizophrenia, unspecified: Secondary | ICD-10-CM | POA: Diagnosis present

## 2020-05-29 DIAGNOSIS — I358 Other nonrheumatic aortic valve disorders: Secondary | ICD-10-CM | POA: Diagnosis not present

## 2020-05-29 DIAGNOSIS — D649 Anemia, unspecified: Secondary | ICD-10-CM | POA: Diagnosis present

## 2020-05-29 DIAGNOSIS — Z20822 Contact with and (suspected) exposure to covid-19: Secondary | ICD-10-CM | POA: Diagnosis present

## 2020-05-29 DIAGNOSIS — R569 Unspecified convulsions: Secondary | ICD-10-CM | POA: Diagnosis not present

## 2020-05-29 DIAGNOSIS — Z79899 Other long term (current) drug therapy: Secondary | ICD-10-CM

## 2020-05-29 DIAGNOSIS — R161 Splenomegaly, not elsewhere classified: Secondary | ICD-10-CM | POA: Diagnosis not present

## 2020-05-29 DIAGNOSIS — G250 Essential tremor: Secondary | ICD-10-CM | POA: Diagnosis present

## 2020-05-29 DIAGNOSIS — R29818 Other symptoms and signs involving the nervous system: Secondary | ICD-10-CM | POA: Diagnosis not present

## 2020-05-29 DIAGNOSIS — D709 Neutropenia, unspecified: Secondary | ICD-10-CM | POA: Diagnosis not present

## 2020-05-29 DIAGNOSIS — I5020 Unspecified systolic (congestive) heart failure: Secondary | ICD-10-CM | POA: Diagnosis not present

## 2020-05-29 DIAGNOSIS — G9381 Temporal sclerosis: Secondary | ICD-10-CM | POA: Diagnosis present

## 2020-05-29 DIAGNOSIS — N179 Acute kidney failure, unspecified: Secondary | ICD-10-CM | POA: Diagnosis present

## 2020-05-29 LAB — CBC WITH DIFFERENTIAL/PLATELET
Abs Immature Granulocytes: 0 10*3/uL (ref 0.00–0.07)
Basophils Absolute: 0 10*3/uL (ref 0.0–0.1)
Basophils Relative: 1 %
Eosinophils Absolute: 0.1 10*3/uL (ref 0.0–0.5)
Eosinophils Relative: 4 %
HCT: 15.6 % — ABNORMAL LOW (ref 36.0–46.0)
Hemoglobin: 4.7 g/dL — CL (ref 12.0–15.0)
Immature Granulocytes: 0 %
Lymphocytes Relative: 56 %
Lymphs Abs: 0.8 10*3/uL (ref 0.7–4.0)
MCH: 32.9 pg (ref 26.0–34.0)
MCHC: 30.1 g/dL (ref 30.0–36.0)
MCV: 109.1 fL — ABNORMAL HIGH (ref 80.0–100.0)
Monocytes Absolute: 0.4 10*3/uL (ref 0.1–1.0)
Monocytes Relative: 30 %
Neutro Abs: 0.1 10*3/uL — ABNORMAL LOW (ref 1.7–7.7)
Neutrophils Relative %: 9 %
Platelets: 191 10*3/uL (ref 150–400)
RBC: 1.43 MIL/uL — ABNORMAL LOW (ref 3.87–5.11)
RDW: 23.1 % — ABNORMAL HIGH (ref 11.5–15.5)
WBC: 1.4 10*3/uL — CL (ref 4.0–10.5)
nRBC: 0 % (ref 0.0–0.2)

## 2020-05-29 LAB — IRON AND TIBC
Iron: 74 ug/dL (ref 28–170)
Saturation Ratios: 22 % (ref 10.4–31.8)
TIBC: 341 ug/dL (ref 250–450)
UIBC: 267 ug/dL

## 2020-05-29 LAB — COMPREHENSIVE METABOLIC PANEL
ALT: 37 U/L (ref 0–44)
AST: 23 U/L (ref 15–41)
Albumin: 3.8 g/dL (ref 3.5–5.0)
Alkaline Phosphatase: 92 U/L (ref 38–126)
Anion gap: 10 (ref 5–15)
BUN: 28 mg/dL — ABNORMAL HIGH (ref 8–23)
CO2: 25 mmol/L (ref 22–32)
Calcium: 8.3 mg/dL — ABNORMAL LOW (ref 8.9–10.3)
Chloride: 102 mmol/L (ref 98–111)
Creatinine, Ser: 1.2 mg/dL — ABNORMAL HIGH (ref 0.44–1.00)
GFR calc Af Amer: 55 mL/min — ABNORMAL LOW (ref >60)
GFR calc non Af Amer: 47 mL/min — ABNORMAL LOW (ref >60)
Glucose, Bld: 115 mg/dL — ABNORMAL HIGH (ref 70–99)
Potassium: 4.1 mmol/L (ref 3.5–5.1)
Sodium: 137 mmol/L (ref 135–145)
Total Bilirubin: 0.6 mg/dL (ref 0.3–1.2)
Total Protein: 6.1 g/dL — ABNORMAL LOW (ref 6.5–8.1)

## 2020-05-29 LAB — VITAMIN B12: Vitamin B-12: 197 pg/mL (ref 180–914)

## 2020-05-29 LAB — RETICULOCYTES
Immature Retic Fract: 13.1 % (ref 2.3–15.9)
RBC.: 1.39 MIL/uL — ABNORMAL LOW (ref 3.87–5.11)
Retic Count, Absolute: 17 10*3/uL — ABNORMAL LOW (ref 19.0–186.0)
Retic Ct Pct: 1.2 % (ref 0.4–3.1)

## 2020-05-29 LAB — BRAIN NATRIURETIC PEPTIDE: B Natriuretic Peptide: 101.3 pg/mL — ABNORMAL HIGH (ref 0.0–100.0)

## 2020-05-29 LAB — ABO/RH: ABO/RH(D): A POS

## 2020-05-29 LAB — TSH: TSH: 3.156 u[IU]/mL (ref 0.350–4.500)

## 2020-05-29 LAB — PREPARE RBC (CROSSMATCH)

## 2020-05-29 LAB — FOLATE: Folate: 12.6 ng/mL (ref >5.9)

## 2020-05-29 LAB — FERRITIN: Ferritin: 169 ng/mL (ref 11–307)

## 2020-05-29 MED ORDER — POTASSIUM CHLORIDE CRYS ER 20 MEQ PO TBCR
20.0000 meq | EXTENDED_RELEASE_TABLET | Freq: Once | ORAL | Status: AC
  Administered 2020-05-30: 20 meq via ORAL
  Filled 2020-05-29: qty 1

## 2020-05-29 MED ORDER — FUROSEMIDE 10 MG/ML IJ SOLN
20.0000 mg | Freq: Once | INTRAMUSCULAR | Status: AC
  Administered 2020-05-30: 20 mg via INTRAVENOUS
  Filled 2020-05-29: qty 4

## 2020-05-29 MED ORDER — SODIUM CHLORIDE 0.9 % IV SOLN
10.0000 mL/h | Freq: Once | INTRAVENOUS | Status: AC
  Administered 2020-05-30: 10 mL/h via INTRAVENOUS

## 2020-05-29 MED ORDER — LEVETIRACETAM 500 MG PO TABS
1000.0000 mg | ORAL_TABLET | Freq: Once | ORAL | Status: AC
  Administered 2020-05-29: 1000 mg via ORAL
  Filled 2020-05-29: qty 2

## 2020-05-29 NOTE — ED Notes (Signed)
Unable to obatin IV access X2 consulted IV team

## 2020-05-29 NOTE — ED Notes (Signed)
Date and time results received: 05/29/20 10:11 PM  Test: WBC, HGB Critical Value: 1.4, 4.7  Name of Provider Notified: Dr.Kohut  Orders Received? Or Actions Taken?:

## 2020-05-29 NOTE — ED Triage Notes (Signed)
Arrived POV from home. Patient sent by PCP because hemoglobin is 4.9. Patient reports [facility] with exertion. Patient states leg swelling has been for 2 weeks but has gotten worse over past few days. Patient is pale

## 2020-05-29 NOTE — ED Provider Notes (Signed)
[facility] DEPT Provider Note   CSN: [id number] Arrival date & time: 05/29/20  2032     History Chief Complaint  Patient presents with  . Low Hemoglobin  . Leg Swelling    Andrea Crawford is a 64 y.o. female.  HPI   39yM sent for evaluation of anemia and leukopenia. Advised to come to the ED for further evaluation. For the past several weeks has been unusually fatigued. Dyspnea with exertion. Increasing LE edema. Pale in color. No acute pain. No gross blood in stool or melena. Hx of epilepsy and on numerous antiepileptics. No medications are new.   Past Medical History:  Diagnosis Date  . Seizures ([place])     There are no problems to display for this patient.   No past surgical history on file.   OB History   No obstetric history on file.     No family history on file.  Social History   Tobacco Use  . Smoking status: Never Smoker  Substance Use Topics  . Alcohol use: No  . Drug use: No    Home Medications Prior to Admission medications   Medication Sig Start Date End Date Taking? Authorizing Provider  acetaminophen (TYLENOL) 500 MG tablet Take 1,000 mg by mouth every 6 (six) hours as needed for headache (pain).    [provider]  amantadine (SYMMETREL) 100 MG capsule Take 100 mg by mouth daily. 01/10/19   [provider]  ARIPiprazole (ABILIFY) 10 MG tablet Take 10 mg by mouth daily.     [provider]  Ascorbic Acid (VITAMIN C) 1000 MG tablet Take 1,000 mg by mouth 2 (two) times a day.    [provider]  cholecalciferol (VITAMIN D3) 25 MCG (1000 UT) tablet Take 1,000 Units by mouth daily as needed (sunshine replacement).    [provider]  Clobazam (ONFI) 10 MG TABS Take 5 mg by mouth 2 (two) times daily.     [provider]  lacosamide (VIMPAT) 50 MG TABS tablet Take 50-150 mg by mouth See admin instructions. Take 1 tablet (50 mg) by mouth with breakfast and lunch, take 3  tablets (150 mg) at bedtime    [provider]  lamoTRIgine (LAMICTAL) 200 MG tablet Take 200-400 mg by mouth See admin instructions. Take one tablet (200 mg) by mouth with breakfast and lunch, take two tablets (400 mg) at bedtime    [provider]  levETIRAcetam (KEPPRA) 1000 MG tablet Take 500-1,000 mg by mouth See admin instructions. Take 1/2 tablet (500 mg) by mouth with breakfast and lunch, take 1 tablet (1000 mg) at bedtime    [provider]  meloxicam (MOBIC) 15 MG tablet Take 15 mg by mouth at bedtime as needed for pain.  01/15/19   [provider]  Multiple Vitamins-Minerals (ZINC PO) Take 2 tablets by mouth daily with lunch.    [provider]  omeprazole (PRILOSEC) 40 MG capsule Take 40 mg by mouth at bedtime. 01/10/19   [provider]  PRESCRIPTION MEDICATION Inhale into the lungs at bedtime. CPAP    [provider]  pyridOXINE (VITAMIN B-6) 100 MG tablet Take 600 mg by mouth daily.    [provider]    Allergies    Patient has no known allergies.  Review of Systems   Review of Systems All systems reviewed and negative, other than as noted in HPI.  Physical Exam Updated Vital Signs BP 102/62   Pulse 66  Temp 97.8 F (36.6 C) (Oral)   Resp (!) 29   Ht 5\' 3"  (1.6 m)   Wt 70.3 kg   SpO2 95%   BMI 27.46 kg/m   Physical Exam Vitals and nursing note reviewed.  Constitutional:      General: She is not in acute distress.    Appearance: She is well-developed.  HENT:     Head: Normocephalic and atraumatic.  Eyes:     General:        Right eye: No discharge.        Left eye: No discharge.     Conjunctiva/sclera: Conjunctivae normal.  Cardiovascular:     Rate and Rhythm: Normal rate and regular rhythm.     Heart sounds: Normal heart sounds. No murmur heard.  No friction rub. No gallop.   Pulmonary:     Effort: Pulmonary effort is normal. No respiratory distress.     Breath sounds: Normal breath  sounds.  Abdominal:     General: There is no distension.     Palpations: Abdomen is soft.     Tenderness: There is no abdominal tenderness.  Musculoskeletal:        General: No tenderness.     Cervical back: Neck supple.     Right lower leg: Edema present.     Left lower leg: Edema present.  Skin:    General: Skin is warm and dry.     Coloration: Skin is pale.  Neurological:     Mental Status: She is alert.  Psychiatric:        Behavior: Behavior normal.        Thought Content: Thought content normal.     ED Results / Procedures / Treatments   Labs (all labs ordered are listed, but only abnormal results are displayed) Labs Reviewed  CBC WITH DIFFERENTIAL/PLATELET - Abnormal; Notable for the following components:      Result Value   WBC 1.4 (*)    RBC 1.43 (*)    Hemoglobin 4.7 (*)    HCT 15.6 (*)    MCV 109.1 (*)    RDW 23.1 (*)    Neutro Abs 0.1 (*)    All other components within normal limits  COMPREHENSIVE METABOLIC PANEL - Abnormal; Notable for the following components:   Glucose, Bld 115 (*)    BUN 28 (*)    Creatinine, Ser 1.20 (*)    Calcium 8.3 (*)    Total Protein 6.1 (*)    GFR calc non Af Amer 47 (*)    GFR calc Af Amer 55 (*)    All other components within normal limits  RETICULOCYTES - Abnormal; Notable for the following components:   RBC. 1.39 (*)    Retic Count, Absolute 17.0 (*)    All other components within normal limits  SARS CORONAVIRUS 2 BY RT PCR (HOSPITAL ORDER, [facility])  VITAMIN B12  IRON AND TIBC  FERRITIN  TSH  FOLATE  BRAIN NATRIURETIC PEPTIDE  SAVE SMEAR (SS[facility])  PATHOLOGIST SMEAR REVIEW  TYPE AND SCREEN  PREPARE RBC (CROSSMATCH)  ABO/RH    EKG None  Radiology DG Chest 1 View  Result Date: 05/29/2020 CLINICAL DATA:  Dyspnea EXAM: CHEST  1 VIEW COMPARISON:  05/29/2020 FINDINGS: Small bilateral pleural effusions appears slightly increased. Enlarged cardiomediastinal silhouette with vascular  congestion. Increased bibasilar airspace disease. No pneumothorax. IMPRESSION: 1. Cardiomegaly with vascular congestion. 2. Increasing bilateral pleural effusions and bibasilar atelectasis or pneumonia. Electronically Signed  By: Donavan Foil M.D.   On: 05/29/2020 22:53    Procedures Procedures (including critical care time)  CRITICAL CARE Performed by: Virgel Manifold   Total critical care time: 35 minutes  Critical care time was exclusive of separately billable procedures and treating other patients. Critical care was necessary to treat or prevent imminent or life-threatening deterioration. Critical care was time spent personally by me on the following activities: development of treatment plan with patient and/or surrogate as well as nursing, discussions with consultants, evaluation of patient's response to treatment, examination of patient, obtaining history from patient or surrogate, ordering and performing treatments and interventions, ordering and review of laboratory studies, ordering and review of radiographic studies, pulse oximetry and re-evaluation of patient's condition.   Medications Ordered in ED Medications - No data to display  ED Course  I have reviewed the triage vital signs and the nursing notes.  Pertinent labs & imaging results that were available during my care of the patient were reviewed by me and considered in my medical decision making (see chart for details).    MDM Rules/Calculators/A&P                          63yF with anemia/leukopenia. Platelets fine. Unclear etiology. Macrocytic.No ETOH. Nutritional?  Myelodysplastic syndrome?   Severe LE edema. Congestion on CXR. High output HF? Will start to diurese gently with low normal BP. Admit.   Final Clinical Impression(s) / ED Diagnoses Final diagnoses:  Symptomatic anemia  Neutropenia, unspecified type ([place])  Peripheral edema    Rx / DC Orders ED Discharge Orders    None       Virgel Manifold, MD  05/29/20 2321

## 2020-05-30 ENCOUNTER — Inpatient Hospital Stay (HOSPITAL_COMMUNITY): Payer: Medicare Other

## 2020-05-30 ENCOUNTER — Encounter (HOSPITAL_COMMUNITY): Payer: Self-pay | Admitting: Internal Medicine

## 2020-05-30 DIAGNOSIS — G4730 Sleep apnea, unspecified: Secondary | ICD-10-CM | POA: Diagnosis present

## 2020-05-30 DIAGNOSIS — D649 Anemia, unspecified: Secondary | ICD-10-CM

## 2020-05-30 DIAGNOSIS — R609 Edema, unspecified: Secondary | ICD-10-CM

## 2020-05-30 DIAGNOSIS — D72819 Decreased white blood cell count, unspecified: Secondary | ICD-10-CM | POA: Diagnosis present

## 2020-05-30 DIAGNOSIS — I509 Heart failure, unspecified: Secondary | ICD-10-CM | POA: Insufficient documentation

## 2020-05-30 DIAGNOSIS — R600 Localized edema: Secondary | ICD-10-CM | POA: Diagnosis present

## 2020-05-30 DIAGNOSIS — G40909 Epilepsy, unspecified, not intractable, without status epilepticus: Secondary | ICD-10-CM

## 2020-05-30 DIAGNOSIS — I5020 Unspecified systolic (congestive) heart failure: Secondary | ICD-10-CM

## 2020-05-30 DIAGNOSIS — D709 Neutropenia, unspecified: Secondary | ICD-10-CM

## 2020-05-30 DIAGNOSIS — I340 Nonrheumatic mitral (valve) insufficiency: Secondary | ICD-10-CM

## 2020-05-30 LAB — ECHOCARDIOGRAM COMPLETE
AR max vel: 2.17 cm2
AV Area VTI: 2.05 cm2
AV Area mean vel: 1.98 cm2
AV Mean grad: 10 mmHg
AV Peak grad: 18.8 mmHg
Ao pk vel: 2.17 m/s
Area-P 1/2: 3.77 cm2
Height: 63 in
S' Lateral: 3.2 cm
Weight: 2480 oz

## 2020-05-30 LAB — CBC WITH DIFFERENTIAL/PLATELET
Abs Immature Granulocytes: 0 10*3/uL (ref 0.00–0.07)
Basophils Absolute: 0 10*3/uL (ref 0.0–0.1)
Basophils Relative: 0 %
Eosinophils Absolute: 0 10*3/uL (ref 0.0–0.5)
Eosinophils Relative: 1 %
HCT: 14.4 % — ABNORMAL LOW (ref 36.0–46.0)
Hemoglobin: 4.5 g/dL — CL (ref 12.0–15.0)
Immature Granulocytes: 0 %
Lymphocytes Relative: 45 %
Lymphs Abs: 0.4 10*3/uL — ABNORMAL LOW (ref 0.7–4.0)
MCH: 33.3 pg (ref 26.0–34.0)
MCHC: 31.3 g/dL (ref 30.0–36.0)
MCV: 106.7 fL — ABNORMAL HIGH (ref 80.0–100.0)
Monocytes Absolute: 0.4 10*3/uL (ref 0.1–1.0)
Monocytes Relative: 36 %
Neutro Abs: 0.2 10*3/uL — ABNORMAL LOW (ref 1.7–7.7)
Neutrophils Relative %: 18 %
Platelets: 130 10*3/uL — ABNORMAL LOW (ref 150–400)
RBC: 1.35 MIL/uL — ABNORMAL LOW (ref 3.87–5.11)
RDW: 24 % — ABNORMAL HIGH (ref 11.5–15.5)
WBC: 1 10*3/uL — CL (ref 4.0–10.5)
nRBC: 2 % — ABNORMAL HIGH (ref 0.0–0.2)

## 2020-05-30 LAB — PREPARE RBC (CROSSMATCH)

## 2020-05-30 LAB — URINALYSIS, ROUTINE W REFLEX MICROSCOPIC
Bilirubin Urine: NEGATIVE
Glucose, UA: NEGATIVE mg/dL
Ketones, ur: NEGATIVE mg/dL
Leukocytes,Ua: NEGATIVE
Nitrite: NEGATIVE
Protein, ur: NEGATIVE mg/dL
Specific Gravity, Urine: 1.006 (ref 1.005–1.030)
pH: 6 (ref 5.0–8.0)

## 2020-05-30 LAB — HIV ANTIBODY (ROUTINE TESTING W REFLEX): HIV Screen 4th Generation wRfx: NONREACTIVE

## 2020-05-30 LAB — TYPE AND SCREEN

## 2020-05-30 LAB — SAVE SMEAR(SSMR), FOR PROVIDER SLIDE REVIEW

## 2020-05-30 LAB — HEMOGLOBIN AND HEMATOCRIT, BLOOD
HCT: 20.4 % — ABNORMAL LOW (ref 36.0–46.0)
Hemoglobin: 6.8 g/dL — CL (ref 12.0–15.0)

## 2020-05-30 LAB — PATHOLOGIST SMEAR REVIEW

## 2020-05-30 LAB — SARS CORONAVIRUS 2 BY RT PCR (HOSPITAL ORDER, PERFORMED IN ~~LOC~~ HOSPITAL LAB): SARS Coronavirus 2: NEGATIVE

## 2020-05-30 MED ORDER — LAMOTRIGINE 100 MG PO TABS
200.0000 mg | ORAL_TABLET | Freq: Two times a day (BID) | ORAL | Status: DC
  Administered 2020-05-30 – 2020-06-05 (×14): 200 mg via ORAL
  Filled 2020-05-30 (×3): qty 2
  Filled 2020-05-30: qty 8
  Filled 2020-05-30 (×5): qty 2
  Filled 2020-05-30: qty 8
  Filled 2020-05-30 (×2): qty 2
  Filled 2020-05-30: qty 8
  Filled 2020-05-30 (×2): qty 2

## 2020-05-30 MED ORDER — ONDANSETRON HCL 4 MG/2ML IJ SOLN: 4.0000 mg | Freq: Four times a day (QID) | INTRAMUSCULAR | Status: DC | PRN

## 2020-05-30 MED ORDER — HEPARIN SODIUM (PORCINE) 5000 UNIT/ML IJ SOLN: 5000.0000 [IU] | Freq: Three times a day (TID) | INTRAMUSCULAR | Status: DC

## 2020-05-30 MED ORDER — VITAMIN B-6 100 MG PO TABS
600.0000 mg | ORAL_TABLET | Freq: Every day | ORAL | Status: DC
  Administered 2020-05-30 – 2020-06-05 (×7): 600 mg via ORAL
  Filled 2020-05-30 (×7): qty 6

## 2020-05-30 MED ORDER — AMANTADINE HCL 100 MG PO CAPS
100.0000 mg | ORAL_CAPSULE | Freq: Two times a day (BID) | ORAL | Status: DC
  Administered 2020-05-30 – 2020-06-05 (×14): 100 mg via ORAL
  Filled 2020-05-30 (×14): qty 1

## 2020-05-30 MED ORDER — ONDANSETRON HCL 4 MG PO TABS: 4.0000 mg | ORAL_TABLET | Freq: Four times a day (QID) | ORAL | Status: DC | PRN

## 2020-05-30 MED ORDER — LEVETIRACETAM 500 MG PO TABS
1000.0000 mg | ORAL_TABLET | Freq: Every day | ORAL | Status: DC
  Administered 2020-05-30 – 2020-06-04 (×6): 1000 mg via ORAL
  Filled 2020-05-30 (×6): qty 2

## 2020-05-30 MED ORDER — LEVETIRACETAM 500 MG PO TABS: 500.0000 mg | ORAL_TABLET | ORAL | Status: DC

## 2020-05-30 MED ORDER — LACOSAMIDE 50 MG PO TABS
150.0000 mg | ORAL_TABLET | Freq: Every day | ORAL | Status: DC
  Administered 2020-05-30 – 2020-06-04 (×7): 150 mg via ORAL
  Filled 2020-05-30 (×7): qty 3

## 2020-05-30 MED ORDER — ZINC SULFATE 220 (50 ZN) MG PO CAPS
220.0000 mg | ORAL_CAPSULE | Freq: Every day | ORAL | Status: DC
  Administered 2020-05-30 – 2020-06-05 (×7): 220 mg via ORAL
  Filled 2020-05-30 (×7): qty 1

## 2020-05-30 MED ORDER — ZINC GLUCONATE 50 MG PO TABS: 200.0000 mg | ORAL_TABLET | Freq: Every day | ORAL | Status: DC

## 2020-05-30 MED ORDER — LACOSAMIDE 50 MG PO TABS
50.0000 mg | ORAL_TABLET | Freq: Two times a day (BID) | ORAL | Status: DC
  Administered 2020-05-30 – 2020-06-05 (×14): 50 mg via ORAL
  Filled 2020-05-30 (×14): qty 1

## 2020-05-30 MED ORDER — CLOBAZAM 10 MG PO TABS: 5.0000 mg | ORAL_TABLET | ORAL | Status: DC

## 2020-05-30 MED ORDER — FUROSEMIDE 10 MG/ML IJ SOLN
20.0000 mg | Freq: Two times a day (BID) | INTRAMUSCULAR | Status: DC
  Administered 2020-05-30 – 2020-06-05 (×13): 20 mg via INTRAVENOUS
  Filled 2020-05-30 (×2): qty 2
  Filled 2020-05-30: qty 4
  Filled 2020-05-30 (×10): qty 2

## 2020-05-30 MED ORDER — PANTOPRAZOLE SODIUM 40 MG PO TBEC
40.0000 mg | DELAYED_RELEASE_TABLET | Freq: Every day | ORAL | Status: DC
  Administered 2020-05-30 – 2020-06-05 (×7): 40 mg via ORAL
  Filled 2020-05-30 (×8): qty 1

## 2020-05-30 MED ORDER — LACOSAMIDE 50 MG PO TABS: 50.0000 mg | ORAL_TABLET | ORAL | Status: DC

## 2020-05-30 MED ORDER — ARIPIPRAZOLE 5 MG PO TABS
15.0000 mg | ORAL_TABLET | Freq: Every day | ORAL | Status: DC
  Administered 2020-05-30: 15 mg via ORAL
  Filled 2020-05-30: qty 1

## 2020-05-30 MED ORDER — AMANTADINE HCL 100 MG PO CAPS: 100.0000 mg | ORAL_CAPSULE | Freq: Two times a day (BID) | ORAL | Status: DC

## 2020-05-30 MED ORDER — LEVETIRACETAM 500 MG PO TABS
500.0000 mg | ORAL_TABLET | Freq: Two times a day (BID) | ORAL | Status: DC
  Administered 2020-05-30 – 2020-06-05 (×14): 500 mg via ORAL
  Filled 2020-05-30 (×13): qty 1

## 2020-05-30 MED ORDER — CLOBAZAM 10 MG PO TABS
5.0000 mg | ORAL_TABLET | Freq: Every day | ORAL | Status: DC
  Administered 2020-05-30 – 2020-06-05 (×7): 5 mg via ORAL
  Filled 2020-05-30: qty 1
  Filled 2020-05-30: qty 0.5
  Filled 2020-05-30 (×2): qty 1
  Filled 2020-05-30: qty 0.5
  Filled 2020-05-30 (×2): qty 1

## 2020-05-30 MED ORDER — LAMOTRIGINE 100 MG PO TABS: 200.0000 mg | ORAL_TABLET | ORAL | Status: DC

## 2020-05-30 MED ORDER — SODIUM CHLORIDE 0.9% IV SOLUTION: Freq: Once | INTRAVENOUS | Status: DC

## 2020-05-30 MED ORDER — CLOBAZAM 10 MG PO TABS
15.0000 mg | ORAL_TABLET | Freq: Every day | ORAL | Status: DC
  Administered 2020-05-30 – 2020-06-04 (×7): 15 mg via ORAL
  Filled 2020-05-30 (×4): qty 2
  Filled 2020-05-30: qty 1.5
  Filled 2020-05-30 (×2): qty 2

## 2020-05-30 MED ORDER — FUROSEMIDE 10 MG/ML IJ SOLN
20.0000 mg | Freq: Once | INTRAMUSCULAR | Status: AC
  Administered 2020-05-30: 20 mg via INTRAVENOUS
  Filled 2020-05-30: qty 4

## 2020-05-30 MED ORDER — ASCORBIC ACID 500 MG PO TABS
1000.0000 mg | ORAL_TABLET | Freq: Every day | ORAL | Status: DC
  Administered 2020-05-30 – 2020-06-05 (×7): 1000 mg via ORAL
  Filled 2020-05-30 (×7): qty 2

## 2020-05-30 MED ORDER — LAMOTRIGINE 100 MG PO TABS
400.0000 mg | ORAL_TABLET | Freq: Every day | ORAL | Status: DC
  Administered 2020-05-30 – 2020-06-04 (×7): 400 mg via ORAL
  Filled 2020-05-30 (×6): qty 4

## 2020-05-30 NOTE — ED Notes (Signed)
Patients family stating she did not take the 1000mg  Keppra at 2300. States they will start the [place] back at 0800 as scheduled.

## 2020-05-30 NOTE — ED Notes (Signed)
Pt has significant swelling and bruising to her right upper arm where IV was and where she received 2 units of PRBC through previously.  IV was removed.  MD notified.  Pt has some skin breakdown around the site also.  Applied gauze at this time and will apply new dressing (need to ensure no bleeding from old IV site).  No neuro deficits on this arm.  Placed new IV in other hand and blood is infusing on that side.

## 2020-05-30 NOTE — H&P (Addendum)
History and Physical    CRISTAN HOUT HGD:[id number] DOB: 1956-01-09 DOA: 05/29/2020  PCP: [name], MD  Patient coming from: Home.  Chief Complaint: Abnormal labs.  HPI: Andrea Crawford is a 64 y.o. female with history of epilepsy and sleep apnea was experiencing increasing peripheral edema with shortness of breath over the last 10 days.  Denies any fever chills chest pain or productive cough.  Has not had any new medications except for that patient's Abilify dose was recently increased from 10 mg to 15 mg.  Given the symptoms patient presented to her primary care physician who had done some blood work and was found to have low hemoglobin and WBC count and was referred to the ER.  ED Course: In the ER patient was not hypoxic temperature was around 99 F.  On exam patient has elevated JVD and bilateral lower extremity edema extending up to the knees.  Chest x-ray shows bilateral pleural effusion cardiomegaly with CHF versus pneumonia.  Patient does not have any productive cough or fever to suggest pneumonia.  BNP was 101.3 EKG showed normal sinus rhythm QTC of 491 ms creatinine is 1.2 which is increased from baseline of normal last year and WBC count is 1.4 with neutrophils 9% and lymphocyte 50% hemoglobin 4.7 and platelets 191.  Covid test was negative.  Patient had 2 units of PRBC transfusion ordered and admitted for further management of severe anemia with leukopenia and possible new onset CHF.  Review of Systems: As per HPI, rest all negative.   Past Medical History:  Diagnosis Date   Seizures [facility])     Past Surgical History:  Procedure Laterality Date   TONSILLECTOMY       reports that she has never smoked. She has never used smokeless tobacco. She reports that she does not drink alcohol and does not use drugs.  No Known Allergies  Family History  Problem Relation Age of Onset   Seizures Neg Hx     Prior to Admission medications   Medication Sig Start Date End  Date Taking? Authorizing Provider  acetaminophen (TYLENOL) 500 MG tablet Take 1,000 mg by mouth every 6 (six) hours as needed for headache (pain).   Yes [provider]  amantadine (SYMMETREL) 100 MG capsule Take 100 mg by mouth 2 (two) times daily.  01/10/19  Yes [provider]  ARIPiprazole (ABILIFY) 10 MG tablet Take 10 mg by mouth at bedtime.    Yes [provider]  Ascorbic Acid (VITAMIN C) 1000 MG tablet Take 1,000 mg by mouth 2 (two) times a day.   Yes [provider]  cholecalciferol (VITAMIN D3) 25 MCG (1000 UT) tablet Take 1,000 Units by mouth daily as needed (sunshine replacement).   Yes [provider]  cloBAZam (ONFI) 10 MG tablet Take 5-15 mg by mouth See admin instructions. 0.5 tablet (5 MG) in the morning and 1.5 tablets (15 MG) in the evening   Yes [provider]  lacosamide (VIMPAT) 50 MG TABS tablet Take 50-150 mg by mouth See admin instructions. Take 1 tablet (50 mg) by mouth with breakfast and lunch, take 3 tablets (150 mg) at bedtime   Yes [provider]  lamoTRIgine (LAMICTAL) 200 MG tablet Take 200-400 mg by mouth See admin instructions. Take one tablet (200 mg) by mouth with breakfast and lunch, take two tablets (400 mg) at bedtime   Yes [provider]  levETIRAcetam (KEPPRA) 1000 MG tablet Take 500-1,000 mg by mouth See admin  instructions. Take 1/2 tablet (500 mg) by mouth with breakfast and lunch, take 1 tablet (1000 mg) at bedtime   Yes [provider]  meloxicam (MOBIC) 15 MG tablet Take 15 mg by mouth at bedtime as needed for pain.  01/15/19  Yes [provider]  omeprazole (PRILOSEC) 40 MG capsule Take 40 mg by mouth at bedtime. 01/10/19  Yes [provider]  pyridOXINE (VITAMIN B-6) 100 MG tablet Take 600 mg by mouth daily.   Yes [provider]  zinc gluconate 50 MG tablet Take 200 mg by mouth in the morning, at noon, in the evening, and at bedtime.   Yes  [provider]  PRESCRIPTION MEDICATION Inhale into the lungs at bedtime. CPAP    [provider]    Physical Exam: Constitutional: Moderately built and nourished. Vitals:   05/30/20 0100 05/30/20 0117 05/30/20 0200 05/30/20 0230  BP: 110/62 (!) 108/58 115/63 128/70  Pulse: 70 66 69 72  Resp: (!) 24 (!) 24 (!) 28 (!) 27  Temp: (!) 97.5 F (36.4 C) 97.8 F (36.6 C)    TempSrc: Oral Oral    SpO2: 97% 99% 95% 96%  Weight:      Height:       Eyes: Anicteric mild pallor. ENMT: No discharge from the ears eyes nose or mouth. Neck: No mass felt.  JVD elevated. Respiratory: No rhonchi or crepitations. Cardiovascular: S1-S2 heard. Abdomen: Soft nontender bowel sounds present. Musculoskeletal: Bilateral lower extremity edema present. Skin: No rash. Neurologic: Alert awake oriented time place and person.  Moves all extremities. Psychiatric: Appears normal.  Normal affect.   Labs on Admission: I have personally reviewed following labs and imaging studies  CBC: Recent Labs  Lab 05/29/20 2125  WBC 1.4*  NEUTROABS 0.1*  HGB 4.7*  HCT 15.6*  MCV 109.1*  PLT 169   Basic Metabolic Panel: Recent Labs  Lab 05/29/20 2125  NA 137  K 4.1  CL 102  CO2 25  GLUCOSE 115*  BUN 28*  CREATININE 1.20*  CALCIUM 8.3*   GFR: Estimated Creatinine Clearance: 44 mL/min (A) (by C-G formula based on SCr of 1.2 mg/dL (H)). Liver Function Tests: Recent Labs  Lab 05/29/20 2125  AST 23  ALT 37  ALKPHOS 92  BILITOT 0.6  PROT 6.1*  ALBUMIN 3.8   No results for input(s): LIPASE, AMYLASE in the last 168 hours. No results for input(s): AMMONIA in the last 168 hours. Coagulation Profile: No results for input(s): INR, PROTIME in the last 168 hours. Cardiac Enzymes: No results for input(s): CKTOTAL, CKMB, CKMBINDEX, TROPONINI in the last 168 hours. BNP (last 3 results) No results for input(s): PROBNP in the last 8760 hours. HbA1C: No results for input(s): HGBA1C in the  last 72 hours. CBG: No results for input(s): GLUCAP in the last 168 hours. Lipid Profile: No results for input(s): CHOL, HDL, LDLCALC, TRIG, CHOLHDL, LDLDIRECT in the last 72 hours. Thyroid Function Tests: Recent Labs    05/29/20 2125  TSH 3.156   Anemia Panel: Recent Labs    05/29/20 2125 05/29/20 2224  VITAMINB12  --  197  FOLATE  --  12.6  FERRITIN  --  169  TIBC  --  341  IRON  --  74  RETICCTPCT 1.2  --    Urine analysis:    Component Value Date/Time   COLORURINE STRAW (A) 02/28/2019 1516   APPEARANCEUR CLEAR 02/28/2019 1516   LABSPEC 1.003 (L) 02/28/2019 1516   PHURINE 6.0 02/28/2019  [place] 02/28/2019 [place] 02/28/2019 [place] 02/28/2019 [place] 02/28/2019 1516   PROTEINUR NEGATIVE 02/28/2019 1516   NITRITE NEGATIVE 02/28/2019 1516   LEUKOCYTESUR NEGATIVE 02/28/2019 1516   Sepsis Labs: @LABRCNTIP (procalcitonin:4,lacticidven:4) ) Recent Results (from the past 240 hour(s))  SARS Coronavirus 2 by RT PCR (hospital order, performed in [facility] hospital lab) Nasopharyngeal Nasopharyngeal Swab     Status: None   Collection Time: 05/29/20 10:24 PM   Specimen: Nasopharyngeal Swab  Result Value Ref Range Status   SARS Coronavirus 2 NEGATIVE NEGATIVE Final    Comment: (NOTE) SARS-CoV-2 target nucleic acids are NOT DETECTED.  The SARS-CoV-2 RNA is generally detectable in upper and lower respiratory specimens during the acute phase of infection. The lowest concentration of SARS-CoV-2 viral copies this assay can detect is 250 copies / mL. A negative result does not preclude SARS-CoV-2 infection and should not be used as the sole basis for treatment or other patient management decisions.  A negative result may occur with improper specimen collection / handling, submission of specimen other than nasopharyngeal swab, presence of viral mutation(s) within the areas targeted by this assay, and  inadequate number of viral copies (<250 copies / mL). A negative result must be combined with clinical observations, patient history, and epidemiological information.  Fact Sheet for Patients:   StrictlyIdeas.no  Fact Sheet for Healthcare Providers: BankingDealers.co.za  This test is not yet approved or  cleared by the [place] FDA and has been authorized for detection and/or diagnosis of SARS-CoV-2 by FDA under an Emergency Use Authorization (EUA).  This EUA will remain in effect (meaning this test can be used) for the duration of the COVID-19 declaration under Section 564(b)(1) of the Act, 21 U.S.C. section 360bbb-3(b)(1), unless the authorization is terminated or revoked sooner.  Performed at [facility], [place] [address] [place], [place] [address]      Radiological Exams on Admission: DG Chest 1 View  Result Date: 05/29/2020 CLINICAL DATA:  Dyspnea EXAM: CHEST  1 VIEW COMPARISON:  05/29/2020 FINDINGS: Small bilateral pleural effusions appears slightly increased. Enlarged cardiomediastinal silhouette with vascular congestion. Increased bibasilar airspace disease. No pneumothorax. IMPRESSION: 1. Cardiomegaly with vascular congestion. 2. Increasing bilateral pleural effusions and bibasilar atelectasis or pneumonia. Electronically Signed   By: Donavan Foil M.D.   On: 05/29/2020 22:53    EKG: Independently reviewed.  Normal sinus rhythm.  Assessment/Plan Principal Problem:   Anemia Active Problems:   Leucopenia   Peripheral edema   Epilepsy (HCC)   Sleep apnea    1. Severe anemia and leukopenia with neutropenia patient is presently afebrile.  2 units of PRBC transfusion has been ordered.  Peripheral smear has been ordered along with anemia panel.  Will need heme-onc input.  Follow labs in the morning after transfusion. 2. Possible new onset CHF for which patient also received 20 mg IV Lasix and will  continue with 20 mill IV every 12.  Closely follow intake output metabolic panel.  Check 2D echo.  Check Dopplers of the lower extremity. 3. Acute renal failure could be from dehydration and anemia.  Check urine studies follow metabolic panel after transfusion.  Patient also had used meloxicam yesterday for lower extremity pain.  Which could also be contributing to the renal failure. 4. History of epilepsy on Keppra and Vimpat Lamictal and Onfi. 5. Movement disorder on amantadine. 6. Sleep apnea on CPAP at bedtime. 7. History of depression  on Abilify.  Given that patient may have a new onset seizure with severe anemia and leukopenia will need inpatient status.   DVT prophylaxis: Foot pumps for now.  In severe anemia meant to make sure patient's hemoglobin raises according to transfusion. Code Status: Full code. Family Communication: Patient's daughter at the bedside. Disposition Plan: Home in stable. Consults called: None. Admission status: Inpatient.   Rise Patience MD [facility] Pager [phone].  If 7PM-7AM, please contact night-coverage [email or web address] Password [email or web address]  05/30/2020, 2:44 AM

## 2020-05-30 NOTE — Progress Notes (Signed)
Pt refusing CPAP QHS.  RN aware, RT to monitor and assess as needed.

## 2020-05-30 NOTE — Consult Note (Addendum)
[place]  [phone] [phone]) [phone]   I have seen her, examined her and agree with the documentation as follows INITIAL HEMATOLOGY CONSULTATION  Referring MD:  Dr. Guilford Shi  Reason for Referral: Severe anemia and neutropenia  HPI: Ms. Corcoran is a 64 year old female with a past medical history significant for epilepsy, schizophrenia, and sleep apnea.  The patient presented to the emergency room due to abnormal lab work performed by her primary care provider.  CBC on admission showed a WBC of 1.4, hemoglobin 4.7, hematocrit 15.6, and platelet count of 191,000.  Chemistry significant for a BUN of 28, creatinine 1.2, calcium 8.3, and total protein of 6.1.  Absolute reticulocytes were decreased at 17.0, vitamin B12 level normal at 197, folate normal at 12.6, ferritin normal at 169 with a normal iron, TIBC, percent saturation, and UIBC.  The patient has received 2 units PRBCs so far this admission.  Repeat CBC with differential not yet obtained.  The patient's most recent CBC available to me was performed at [facility] on 02/01/2020 and is visible through care everywhere.  At that time, her WBC was 5.1, hemoglobin 12.7, platelets 169,000, and ANC 3.5.  The patient was seen in the emergency room today.  Her daughter is at the bedside.  The patient reports that she feels somewhat better after receiving 2 units PRBCs.  Still fatigued.  The patient reports that she has had a decreased appetite.  Her daughter reports that she has lost weight over the past year but more recently, she has gained weight.  She has had lower extremity edema which has been present for at least a week.  The patient has had dizziness and gait abnormality.  She has had multiple falls at home.  She fell in her bathtub within the past week.  Denies head injury.  About 3 weeks ago, she had a migraine over the weekend.  No headaches reported today.  She denies night sweats, fevers, chills.   Denies chest pain but has had shortness of breath with exertion recently.  Denies abdominal pain, nausea, vomiting.  She has not noticed any lymphadenopathy.  Denies bleeding such as epistaxis, hemoptysis, hematemesis, hematuria, melena, hematochezia.  She reports that she bruises easily particularly after she falls.  Reports last seizure was about 1 month ago.  She is followed by neurology at [facility].  Denies any new medications but reports that her Abilify dose was recently increased about 4 weeks ago.  Denies recent illnesses.  Denies recent bug or tick bites. Hematology was asked to the patient to make recommendations regarding her anemia and neutropenia.  Past Medical History:  Diagnosis Date   Seizures ([place])   :    Past Surgical History:  Procedure Laterality Date   TONSILLECTOMY    :   CURRENT MEDS: Current Facility-Administered Medications  Medication Dose Route Frequency Provider Last Rate Last Admin   amantadine (SYMMETREL) capsule 100 mg  100 mg Oral BID Rise Patience, MD   100 mg at 05/30/20 0915   ascorbic acid (VITAMIN C) tablet 1,000 mg  1,000 mg Oral Daily Rise Patience, MD   1,000 mg at 05/30/20 0915   cloBAZam (ONFI) tablet 5 mg  5 mg Oral Daily Guilford Shi, MD       And   cloBAZam (ONFI) tablet 15 mg  15 mg Oral QHS Guilford Shi, MD   15 mg at 05/30/20 0409   furosemide (LASIX) injection 20 mg  20 mg  Intravenous Q12H Rise Patience, MD   20 mg at 05/30/20 3976   lacosamide (VIMPAT) tablet 50 mg  50 mg Oral BID WC Rise Patience, MD   50 mg at 05/30/20 0730   And   lacosamide (VIMPAT) tablet 150 mg  150 mg Oral QHS Rise Patience, MD   150 mg at 05/30/20 0409   lamoTRIgine (LAMICTAL) tablet 200 mg  200 mg Oral BID WC Rise Patience, MD   200 mg at 05/30/20 0730   And   lamoTRIgine (LAMICTAL) tablet 400 mg  400 mg Oral QHS Rise Patience, MD   400 mg at 05/30/20 0408   levETIRAcetam (KEPPRA) tablet 500 mg  500  mg Oral BID WC Rise Patience, MD   500 mg at 05/30/20 7341   And   levETIRAcetam (KEPPRA) tablet 1,000 mg  1,000 mg Oral QHS Rise Patience, MD       ondansetron [facility]) tablet 4 mg  4 mg Oral Q6H PRN Rise Patience, MD       Or   ondansetron [facility]) injection 4 mg  4 mg Intravenous Q6H PRN Rise Patience, MD       pantoprazole (PROTONIX) EC tablet 40 mg  40 mg Oral Daily Rise Patience, MD   40 mg at 05/30/20 0915   pyridOXINE (VITAMIN B-6) tablet 600 mg  600 mg Oral Daily Rise Patience, MD   600 mg at 05/30/20 9379   zinc sulfate capsule 220 mg  220 mg Oral Daily Rise Patience, MD   220 mg at 05/30/20 0240   Current Outpatient Medications  Medication Sig Dispense Refill   acetaminophen (TYLENOL) 500 MG tablet Take 1,000 mg by mouth every 6 (six) hours as needed for headache (pain).     amantadine (SYMMETREL) 100 MG capsule Take 100 mg by mouth 2 (two) times daily.      ARIPiprazole (ABILIFY) 10 MG tablet Take 10 mg by mouth at bedtime.      Ascorbic Acid (VITAMIN C) 1000 MG tablet Take 1,000 mg by mouth 2 (two) times a day.     cholecalciferol (VITAMIN D3) 25 MCG (1000 UT) tablet Take 1,000 Units by mouth daily as needed (sunshine replacement).     cloBAZam (ONFI) 10 MG tablet Take 5-15 mg by mouth See admin instructions. 0.5 tablet (5 MG) in the morning and 1.5 tablets (15 MG) in the evening     lacosamide (VIMPAT) 50 MG TABS tablet Take 50-150 mg by mouth See admin instructions. Take 1 tablet (50 mg) by mouth with breakfast and lunch, take 3 tablets (150 mg) at bedtime     lamoTRIgine (LAMICTAL) 200 MG tablet Take 200-400 mg by mouth See admin instructions. Take one tablet (200 mg) by mouth with breakfast and lunch, take two tablets (400 mg) at bedtime     levETIRAcetam (KEPPRA) 1000 MG tablet Take 500-1,000 mg by mouth See admin instructions. Take 1/2 tablet (500 mg) by mouth with breakfast and lunch, take 1 tablet (1000 mg) at bedtime      meloxicam (MOBIC) 15 MG tablet Take 15 mg by mouth at bedtime as needed for pain.      omeprazole (PRILOSEC) 40 MG capsule Take 40 mg by mouth at bedtime.     pyridOXINE (VITAMIN B-6) 100 MG tablet Take 600 mg by mouth daily.     zinc gluconate 50 MG tablet Take 200 mg by mouth in the morning, at noon, in the  evening, and at bedtime.     PRESCRIPTION MEDICATION Inhale into the lungs at bedtime. CPAP        No Known Allergies:  Family History  Problem Relation Age of Onset   Seizures Neg Hx    :  Social History   Socioeconomic History   Marital status: Married    Spouse name: Not on file   Number of children: Not on file   Years of education: Not on file   Highest education level: Not on file  Occupational History   Not on file  Tobacco Use   Smoking status: Never Smoker   Smokeless tobacco: Never Used  Substance and Sexual Activity   Alcohol use: No   Drug use: No   Sexual activity: Not on file  Other Topics Concern   Not on file  Social History Narrative   Not on file   Social Determinants of Health   Financial Resource Strain:    Difficulty of Paying Living Expenses:   Food Insecurity:    Worried About Charity fundraiser in the Last Year:    Arboriculturist in the Last Year:   Transportation Needs:    Film/video editor (Medical):    Lack of Transportation (Non-Medical):   Physical Activity:    Days of Exercise per Week:    Minutes of Exercise per Session:   Stress:    Feeling of Stress :   Social Connections:    Frequency of Communication with Friends and Family:    Frequency of Social Gatherings with Friends and Family:    Attends Religious Services:    Active Member of Clubs or Organizations:    Attends Music therapist:    Marital Status:   Intimate Partner Violence:    Fear of Current or Ex-Partner:    Emotionally Abused:    Physically Abused:    Sexually Abused:   :  REVIEW OF SYSTEMS:  A comprehensive 14 point review of  systems was negative except as noted in the HPI.    Exam: Patient Vitals for the past 24 hrs:  BP Temp Temp src Pulse Resp SpO2 Height Weight  05/30/20 0900 (!) 108/57 98.5 F (36.9 C) -- 74 (!) 24 92 % -- --  05/30/20 0830 114/64 -- -- 77 (!) 25 91 % -- --  05/30/20 0733 117/60 -- -- 78 (!) 24 94 % -- --  05/30/20 0634 (!) 118/57 -- -- 80 (!) 35 92 % -- --  05/30/20 0630 (!) 118/51 -- -- -- -- -- -- --  05/30/20 0600 (!) 120/57 -- -- 80 (!) 28 94 % -- --  05/30/20 0543 (!) 105/58 99.3 F (37.4 C) Oral 80 (!) 26 90 % -- --  05/30/20 0526 (!) 103/56 98.7 F (37.1 C) Oral 80 (!) 24 91 % -- --  05/30/20 0526 (!) 103/56 -- -- 80 (!) 28 90 % -- --  05/30/20 0412 (!) 118/59 98.3 F (36.8 C) Oral 79 (!) 28 92 % -- --  05/30/20 0300 130/66 -- -- 77 (!) 27 96 % -- --  05/30/20 0230 128/70 -- -- 72 (!) 27 96 % -- --  05/30/20 0200 115/63 -- -- 69 (!) 28 95 % -- --  05/30/20 0117 (!) 108/58 97.8 F (36.6 C) Oral 66 (!) 24 99 % -- --  05/30/20 0100 110/62 (!) 97.5 F (36.4 C) Oral 70 (!) 24 97 % -- --  05/29/20 2330 113/71 -- --  66 (!) 26 -- -- --  05/29/20 2300 105/62 -- -- -- (!) 30 -- -- --  05/29/20 2259 105/62 -- -- 65 (!) 28 96 % -- --  05/29/20 2243 -- -- -- 65 (!) 27 96 % -- --  05/29/20 2201 102/62 -- -- 66 (!) 29 95 % -- --  05/29/20 2153 -- -- -- 66 (!) 29 95 % -- --  05/29/20 2059 (!) 107/51 97.8 F (36.6 C) Oral 70 18 96 % 5\' 3"  (1.6 m) 70.3 kg    General: Awake but only intermittently alert, no distress Eyes: EOMI, PERRL, no scleral icterus.   ENT: Oral mucosa dry, pallor noted, there were no oropharyngeal lesions.   Neck was without thyromegaly.   Lymphatics:  Negative cervical, supraclavicular, axillary, or inguinal adenopathy. Respiratory: lungs were clear bilaterally without wheezing or crackles.   Cardiovascular:  Regular rate and rhythm, S1/S2, without murmur, rub or gallop.  1-2+ pitting edema bilaterally.   GI:  abdomen was soft, flat, nontender, nondistended,  without organomegaly.   Musculoskeletal: Strength symmetrical in the upper and lower extremities Skin: No petechiae, scattered bruises noted, purple discoloration at right arm peripheral IV site.   Neuro exam was nonfocal.  Patient was alert and oriented.  Attention was good.  Slurred speech at times making it difficult to understand her at times.  LABS:  Lab Results  Component Value Date   WBC 1.4 (LL) 05/29/2020   HGB 4.7 (LL) 05/29/2020   HCT 15.6 (L) 05/29/2020   PLT 191 05/29/2020   GLUCOSE 115 (H) 05/29/2020   ALT 37 05/29/2020   AST 23 05/29/2020   NA 137 05/29/2020   K 4.1 05/29/2020   CL 102 05/29/2020   CREATININE 1.20 (H) 05/29/2020   BUN 28 (H) 05/29/2020   CO2 25 05/29/2020    DG Chest 1 View  Result Date: 05/29/2020 CLINICAL DATA:  Dyspnea EXAM: CHEST  1 VIEW COMPARISON:  05/29/2020 FINDINGS: Small bilateral pleural effusions appears slightly increased. Enlarged cardiomediastinal silhouette with vascular congestion. Increased bibasilar airspace disease. No pneumothorax. IMPRESSION: 1. Cardiomegaly with vascular congestion. 2. Increasing bilateral pleural effusions and bibasilar atelectasis or pneumonia. Electronically Signed   By: Donavan Foil M.D.   On: 05/29/2020 22:53   ECHOCARDIOGRAM COMPLETE  Result Date: 05/30/2020    ECHOCARDIOGRAM REPORT   Patient Name:   KYANA AICHER [facility] Date of Exam: 05/30/2020 Medical Rec #:  [id number]        Height:       63.0 in Accession #:    [id number]       Weight:       155.0 lb Date of Birth:  [birth year]       BSA:          1.735 m Patient Age:    40 years         BP:           114/64 mmHg Patient Gender: F                HR:           78 bpm. Exam Location:  Inpatient Procedure: 2D Echo Indications:    dyspnea 786.09  History:        Patient has no prior history of Echocardiogram examinations. No                 prior cardiac hx on file.  Sonographer:    Jannett Celestine RDCS (AE) Referring Phys: [phone] [facility]  N KAKRAKANDY IMPRESSIONS  1. Left  ventricular ejection fraction, by estimation, is 60 to 65%. The left ventricle has normal function. The left ventricle has no regional wall motion abnormalities. Left ventricular diastolic parameters were normal.  2. Right ventricular systolic function is normal. The right ventricular size is normal.  3. The mitral valve is normal in structure. Mild mitral valve regurgitation. No evidence of mitral stenosis.  4. The aortic valve is normal in structure. Aortic valve regurgitation is not visualized. No aortic stenosis is present.  5. The inferior vena cava is dilated in size with >50% respiratory variability, suggesting right atrial pressure of 8 mmHg. Comparison(s): No prior Echocardiogram. Conclusion(s)/Recommendation(s): Flow velocities are increased across all valves consistent with high cardiac output. Consider sepsis, thyrotoxicosis, anemia, etc. FINDINGS  Left Ventricle: Left ventricular ejection fraction, by estimation, is 60 to 65%. The left ventricle has normal function. The left ventricle has no regional wall motion abnormalities. The left ventricular internal cavity size was normal in size. There is  no left ventricular hypertrophy. Left ventricular diastolic parameters were normal. Indeterminate filling pressures. Right Ventricle: The right ventricular size is normal. No increase in right ventricular wall thickness. Right ventricular systolic function is normal. Left Atrium: Left atrial size was normal in size. Right Atrium: Right atrial size was normal in size. Pericardium: Trivial pericardial effusion is present. The pericardial effusion is circumferential. Mitral Valve: The mitral valve is normal in structure. Normal mobility of the mitral valve leaflets. Mild mitral valve regurgitation, with posteriorly-directed jet. No evidence of mitral valve stenosis. Tricuspid Valve: The tricuspid valve is normal in structure. Tricuspid valve regurgitation is not demonstrated. No evidence of tricuspid stenosis.  Aortic Valve: The aortic valve is normal in structure. Aortic valve regurgitation is not visualized. No aortic stenosis is present. Aortic valve mean gradient measures 10.0 mmHg. Aortic valve peak gradient measures 18.8 mmHg. Aortic valve area, by VTI measures 2.05 cm. Pulmonic Valve: The pulmonic valve was normal in structure. Pulmonic valve regurgitation is not visualized. No evidence of pulmonic stenosis. Aorta: The aortic root is normal in size and structure. Venous: The inferior vena cava is dilated in size with greater than 50% respiratory variability, suggesting right atrial pressure of 8 mmHg. IAS/Shunts: No atrial level shunt detected by color flow Doppler.  LEFT VENTRICLE PLAX 2D LVIDd:         4.80 cm  Diastology LVIDs:         3.20 cm  LV e' lateral:   9.68 cm/s LV PW:         1.10 cm  LV E/e' lateral: 12.6 LV IVS:        1.00 cm  LV e' medial:    8.00 cm/s LVOT diam:     1.90 cm  LV E/e' medial:  15.2 LV SV:         90 LV SV Index:   52 LVOT Area:     2.84 cm  RIGHT VENTRICLE RV Basal diam:  3.83 cm RV S prime:     15.30 cm/s TAPSE (M-mode): 3.3 cm LEFT ATRIUM             Index       RIGHT ATRIUM           Index LA diam:        3.50 cm 2.02 cm/m  RA Area:     18.30 cm LA Vol (A2C):   67.3 ml 38.79 ml/m RA Volume:   50.40 ml  29.05 ml/m  LA Vol (A4C):   36.2 ml 20.86 ml/m LA Biplane Vol: 53.8 ml 31.01 ml/m  AORTIC VALVE AV Area (Vmax):    2.17 cm AV Area (Vmean):   1.98 cm AV Area (VTI):     2.05 cm AV Vmax:           217.00 cm/s AV Vmean:          150.000 cm/s AV VTI:            0.439 m AV Peak Grad:      18.8 mmHg AV Mean Grad:      10.0 mmHg LVOT Vmax:         166.00 cm/s LVOT Vmean:        105.000 cm/s LVOT VTI:          0.318 m LVOT/AV VTI ratio: 0.72  AORTA Ao Root diam: 2.90 cm MITRAL VALVE MV Area (PHT): 3.77 cm     SHUNTS MV Decel Time: 201 msec     Systemic VTI:  0.32 m MV E velocity: 122.00 cm/s  Systemic Diam: 1.90 cm MV A velocity: 88.30 cm/s MV E/A ratio:  1.38 Mihai Croitoru MD  Electronically signed by Sanda Klein MD Signature Date/Time: 05/30/2020/9:08:12 AM    Final    I have reviewed her peripheral blood smear with the hematopathologist Absolute neutropenia is noted Anisocytosis is noted.  Rare pseudo Pelger-Huet cells are noted  ASSESSMENT AND PLAN:  Severe anemia and neutropenia Work-up to date has been reviewed which shows no evidence of iron deficiency, B12 deficiency, or folate deficiency CBC from 2-1/2 months ago did not show any evidence of anemia or neutropenia No recent new medications but did have a dose increase in Abilify about 1 month ago -I do not think this is the cause of her anemia and neutropenia however Recommend PRBC transfusion for hemoglobin less than 7 I reviewed the plan of care with her daughter I recommend bone marrow aspirate and biopsy after her blood count is stabilized and the patient is more alert to have a meaningful discussion about the risk and benefits of bone marrow aspirate and biopsy I will return tomorrow to discuss this  Epilepsy Reports last seizure about 1 month ago Followed closely by neurology at [facility] on [place], Vimpat, Lamictal and Keppra Monitor closely for seizures  Schizophrenia  The patient is followed by psychiatry as an outpatient Has been on Abilify with a recent dose increase Abilify currently on hold pending further work-up of neutropenia and anemia  Plan of care discussion I have a long discussion with the patient's daughter over the phone because the patient is only intermittently alert I will return tomorrow to resume the discussion and to recheck her blood count  Thank you for this referral.  Mikey Bussing, DNP, AGPCNP-BC, AOCNP Mon/Tues/Thurs/Fri 7am-5pm; Off Wednesdays Cell: [phone]  Heath Lark, MD

## 2020-05-30 NOTE — Progress Notes (Signed)
  Echocardiogram 2D Echocardiogram has been performed.  Andrea Crawford 05/30/2020, 8:54 AM

## 2020-05-30 NOTE — ED Notes (Signed)
Call to blood bank to follow up on blood, they will get it ready.

## 2020-05-30 NOTE — Progress Notes (Addendum)
Please see H&P from this morning for full details.  Essentially 64 year old female with history of epilepsy on multiple antiepileptics (Onfi, Lamictal, Keppra, Vimpat) and schizophrenia who has been on Abilify for many years presents with complaints of dyspnea and dry cough for about 2 weeks associated with leg swellings.  Patient denies any chest pain or sputum production or dizziness or palpitations.  She reports lack of energy.  She apparently had 20 pound weight loss over the last year but started gaining weight about a month back.  She has not had any new medication changes except for Abilify dose increased from 10 mg to 15 mg (previously on 20 mg which was reduced to 10 mg and concern for confusion but again increased back to 15 mg a month back by primary psychiatrist Dr. [name] as schizophrenia was felt to be uncontrolled per daughter).  Given the symptoms patient presented to PCP and lab work revealed significant anemia with hemoglobin less than 5 and leukopenia prompting ED referral.  ED work-up revealed bilateral lower extremity edema, chest x-ray showing bilateral pleural effusion, cardiomegaly raising possibility of CHF versus pneumonia.  WBC 1.4 with neutrophils 9% and lymphocyte 50%, hemoglobin 4.7, platelets 191.  BNP 800.  EKG with NSR, QTC 491 ms, creatinine 1.2.  Patient received 2 units of PRBC transfusion overnight.  Doppler lower extremity negative for DVT.  Patient is awake alert oriented x3 but her speech is difficult to understand.  She does have some slurring at baseline per daughter at bedside but has had worsening over the last month or so.  No other focal signs to suggest acute stroke.  CT head ordered.   It appears that Abilify, although rare, can cause agranulocytosis.  I have placed this medication on hold for now.  Can utilize Haldol as needed if needed.Requested hematology/oncology evaluation and requested pharmacy to review medications that can cause agranulocytosis/bone marrow  suppression as she is also on multiple antiepileptics.  Defer further anemia/neutropenia work-up to heme-onc.  May need bone marrow biopsy to rule out MDS.  Repeat CBC this afternoon.   Will DC antibiotics as doubt pneumonia.  Mostly pleural effusion related dry cough. Continue IV diuresis for anasarca that could be related to severe anemia.  Taper O2 to off.  Follow-up echo results.F/U EKG in am for qtc f/u

## 2020-05-30 NOTE — Progress Notes (Signed)
Bilateral lower extremity venous duplex completed. Refer to "CV Proc" under chart review to view preliminary results.  05/30/2020 12:06 PM Kelby Aline., MHA, RVT, RDCS, RDMS

## 2020-05-30 NOTE — ED Notes (Signed)
Report called  

## 2020-05-31 DIAGNOSIS — F209 Schizophrenia, unspecified: Secondary | ICD-10-CM | POA: Diagnosis present

## 2020-05-31 DIAGNOSIS — D61818 Other pancytopenia: Secondary | ICD-10-CM | POA: Diagnosis present

## 2020-05-31 DIAGNOSIS — D649 Anemia, unspecified: Secondary | ICD-10-CM

## 2020-05-31 DIAGNOSIS — D72819 Decreased white blood cell count, unspecified: Secondary | ICD-10-CM

## 2020-05-31 DIAGNOSIS — E8770 Fluid overload, unspecified: Secondary | ICD-10-CM

## 2020-05-31 LAB — CBC WITH DIFFERENTIAL/PLATELET
Abs Immature Granulocytes: 0 10*3/uL (ref 0.00–0.07)
Abs Immature Granulocytes: 0 10*3/uL (ref 0.00–0.07)
Basophils Absolute: 0 10*3/uL (ref 0.0–0.1)
Basophils Absolute: 0 10*3/uL (ref 0.0–0.1)
Basophils Relative: 1 %
Basophils Relative: 2 %
Eosinophils Absolute: 0 10*3/uL (ref 0.0–0.5)
Eosinophils Absolute: 0.1 10*3/uL (ref 0.0–0.5)
Eosinophils Relative: 4 %
Eosinophils Relative: 4 %
HCT: 20.8 % — ABNORMAL LOW (ref 36.0–46.0)
HCT: 27.5 % — ABNORMAL LOW (ref 36.0–46.0)
Hemoglobin: 6.8 g/dL — CL (ref 12.0–15.0)
Hemoglobin: 9.4 g/dL — ABNORMAL LOW (ref 12.0–15.0)
Immature Granulocytes: 0 %
Immature Granulocytes: 0 %
Lymphocytes Relative: 48 %
Lymphocytes Relative: 51 %
Lymphs Abs: 0.5 10*3/uL — ABNORMAL LOW (ref 0.7–4.0)
Lymphs Abs: 0.7 10*3/uL (ref 0.7–4.0)
MCH: 32.4 pg (ref 26.0–34.0)
MCH: 32.4 pg (ref 26.0–34.0)
MCHC: 32.7 g/dL (ref 30.0–36.0)
MCHC: 34.2 g/dL (ref 30.0–36.0)
MCV: 99 fL (ref 80.0–100.0)
MCV: 94.8 fL (ref 80.0–100.0)
Monocytes Absolute: 0.3 10*3/uL (ref 0.1–1.0)
Monocytes Absolute: 0.3 10*3/uL (ref 0.1–1.0)
Monocytes Relative: 31 %
Monocytes Relative: 25 %
Neutro Abs: 0.2 10*3/uL — ABNORMAL LOW (ref 1.7–7.7)
Neutro Abs: 0.2 10*3/uL — ABNORMAL LOW (ref 1.7–7.7)
Neutrophils Relative %: 16 %
Neutrophils Relative %: 18 %
Platelets: 138 10*3/uL — ABNORMAL LOW (ref 150–400)
Platelets: 138 10*3/uL — ABNORMAL LOW (ref 150–400)
RBC: 2.1 MIL/uL — ABNORMAL LOW (ref 3.87–5.11)
RBC: 2.9 MIL/uL — ABNORMAL LOW (ref 3.87–5.11)
RDW: 23.5 % — ABNORMAL HIGH (ref 11.5–15.5)
RDW: 21.6 % — ABNORMAL HIGH (ref 11.5–15.5)
WBC: 1.1 10*3/uL — CL (ref 4.0–10.5)
WBC: 1.3 10*3/uL — CL (ref 4.0–10.5)
nRBC: 0 % (ref 0.0–0.2)
nRBC: 1.6 % — ABNORMAL HIGH (ref 0.0–0.2)

## 2020-05-31 LAB — BASIC METABOLIC PANEL
Anion gap: 10 (ref 5–15)
BUN: 25 mg/dL — ABNORMAL HIGH (ref 8–23)
CO2: 27 mmol/L (ref 22–32)
Calcium: 8.2 mg/dL — ABNORMAL LOW (ref 8.9–10.3)
Chloride: 101 mmol/L (ref 98–111)
Creatinine, Ser: 0.83 mg/dL (ref 0.44–1.00)
GFR calc Af Amer: >60 mL/min (ref >60)
GFR calc non Af Amer: >60 mL/min (ref >60)
Glucose, Bld: 102 mg/dL — ABNORMAL HIGH (ref 70–99)
Potassium: 3.7 mmol/L (ref 3.5–5.1)
Sodium: 138 mmol/L (ref 135–145)

## 2020-05-31 LAB — PREPARE RBC (CROSSMATCH)

## 2020-05-31 MED ORDER — SODIUM CHLORIDE 0.9% IV SOLUTION: Freq: Once | INTRAVENOUS | Status: DC

## 2020-05-31 MED ORDER — FUROSEMIDE 10 MG/ML IJ SOLN
40.0000 mg | Freq: Once | INTRAMUSCULAR | Status: AC
  Administered 2020-05-31: 40 mg via INTRAVENOUS
  Filled 2020-05-31: qty 4

## 2020-05-31 MED ORDER — ACETAMINOPHEN 325 MG PO TABS
650.0000 mg | ORAL_TABLET | Freq: Once | ORAL | Status: AC
  Administered 2020-05-31: 650 mg via ORAL

## 2020-05-31 MED ORDER — DIPHENHYDRAMINE HCL 25 MG PO CAPS
25.0000 mg | ORAL_CAPSULE | Freq: Once | ORAL | Status: AC
  Administered 2020-05-31: 25 mg via ORAL
  Filled 2020-05-31: qty 1

## 2020-05-31 MED ORDER — ACETAMINOPHEN 325 MG PO TABS
650.0000 mg | ORAL_TABLET | Freq: Once | ORAL | Status: AC
  Administered 2020-05-31: 650 mg via ORAL
  Filled 2020-05-31: qty 2

## 2020-05-31 NOTE — Consult Note (Signed)
Chief Complaint: Patient was seen in consultation today for CT guided bone marrow biopsy Chief Complaint  Patient presents with  . Low Hemoglobin  . Leg Swelling    Referring Physician(s): Gorsuch,N  Supervising Physician: Jacqulynn Cadet  Patient Status: [facility] - In-pt  History of Present Illness: Andrea Crawford is a 64 y.o. female with PMH seizures, schizophrenia, sleep apnea who was admitted to [facility] 8/2 secondary to fatigue/falls at home, dizziness,decreased appetite, LE edema, easy bruising,  abnormal lab work noted at PCP office. CBC on admission showed a WBC of 1.4, hemoglobin 4.7, hematocrit 15.6, and platelet count of 191,000.  Chemistry significant for a BUN of 28, creatinine 1.2, calcium 8.3, and total protein of 6.1.  Absolute reticulocytes were decreased at 17.0, vitamin B12 level normal at 197, folate normal at 12.6, ferritin normal at 169 with a normal iron, TIBC, percent saturation, and UIBC. She has been transfused. Today's CBC revealed WBC 1.1, hgb 6.8, plts 138k. Pt seen by oncology and request now received for CT guided bone marrow biopsy.  Past Medical History:  Diagnosis Date  . Seizures ([place])     Past Surgical History:  Procedure Laterality Date  . TONSILLECTOMY      Allergies: Patient has no known allergies.  Medications: Prior to Admission medications   Medication Sig Start Date End Date Taking? Authorizing Provider  acetaminophen (TYLENOL) 500 MG tablet Take 1,000 mg by mouth every 6 (six) hours as needed for headache (pain).   Yes [provider]  amantadine (SYMMETREL) 100 MG capsule Take 100 mg by mouth 2 (two) times daily.  01/10/19  Yes [provider]  ARIPiprazole (ABILIFY) 10 MG tablet Take 10 mg by mouth at bedtime.    Yes [provider]  Ascorbic Acid (VITAMIN C) 1000 MG tablet Take 1,000 mg by mouth 2 (two) times a day.   Yes [provider]  cholecalciferol (VITAMIN D3) 25 MCG (1000 UT) tablet Take  1,000 Units by mouth daily as needed (sunshine replacement).   Yes [provider]  cloBAZam (ONFI) 10 MG tablet Take 5-15 mg by mouth See admin instructions. 0.5 tablet (5 MG) in the morning and 1.5 tablets (15 MG) in the evening   Yes [provider]  lacosamide (VIMPAT) 50 MG TABS tablet Take 50-150 mg by mouth See admin instructions. Take 1 tablet (50 mg) by mouth with breakfast and lunch, take 3 tablets (150 mg) at bedtime   Yes [provider]  lamoTRIgine (LAMICTAL) 200 MG tablet Take 200-400 mg by mouth See admin instructions. Take one tablet (200 mg) by mouth with breakfast and lunch, take two tablets (400 mg) at bedtime   Yes [provider]  levETIRAcetam (KEPPRA) 1000 MG tablet Take 500-1,000 mg by mouth See admin instructions. Take 1/2 tablet (500 mg) by mouth with breakfast and lunch, take 1 tablet (1000 mg) at bedtime   Yes [provider]  meloxicam (MOBIC) 15 MG tablet Take 15 mg by mouth at bedtime as needed for pain.  01/15/19  Yes [provider]  omeprazole (PRILOSEC) 40 MG capsule Take 40 mg by mouth at bedtime. 01/10/19  Yes [provider]  pyridOXINE (VITAMIN B-6) 100 MG tablet Take 600 mg by mouth daily.   Yes [provider]  zinc gluconate 50 MG tablet Take 200 mg by mouth in the morning, at noon, in the evening, and at bedtime.   Yes [provider]  PRESCRIPTION MEDICATION Inhale into the lungs at  bedtime. CPAP    [provider]     Family History  Problem Relation Age of Onset  . Arthritis/Rheumatoid Mother   . Melanoma Sister   . Melanoma Brother   . Lung cancer Brother   . Breast cancer Paternal Aunt   . Seizures Neg Hx     Social History   Socioeconomic History  . Marital status: Married    Spouse name: Not on file  . Number of children: 4  . Years of education: Not on file  . Highest education level: Not on file  Occupational History  . Not on file  Tobacco Use   . Smoking status: Never Smoker  . Smokeless tobacco: Never Used  Substance and Sexual Activity  . Alcohol use: No  . Drug use: No  . Sexual activity: Not on file  Other Topics Concern  . Not on file  Social History Narrative  . Not on file   Social Determinants of Health   Financial Resource Strain:   . Difficulty of Paying Living Expenses:   Food Insecurity:   . Worried About Charity fundraiser in the Last Year:   . Arboriculturist in the Last Year:   Transportation Needs:   . Film/video editor (Medical):   Marland Kitchen Lack of Transportation (Non-Medical):   Physical Activity:   . Days of Exercise per Week:   . Minutes of Exercise per Session:   Stress:   . Feeling of Stress :   Social Connections:   . Frequency of Communication with Friends and Family:   . Frequency of Social Gatherings with Friends and Family:   . Attends Religious Services:   . Active Member of Clubs or Organizations:   . Attends Archivist Meetings:   Marland Kitchen Marital Status:       Review of Systems see above; reports no fevers, HA, CP, cough; abd/back pain,vomiting or bleeding; does have some dyspnea with exertion  Vital Signs: BP 115/62 (BP Location: Left Arm)   Pulse 73   Temp 98.3 F (36.8 C) (Oral)   Resp 19   Ht '5\' 3"'  (1.6 m)   Wt 151 lb 10.8 oz (68.8 kg)   SpO2 92%   BMI 26.87 kg/m   Physical Exam awake/oriented, speech is fast, somewhat garbled(not new); chest- dim BS bases, heart- RRR; abd- soft,+BS,NT; bilat LE edema noted; hematoma/ecchymosis RUE  Imaging: DG Chest 1 View  Result Date: 05/29/2020 CLINICAL DATA:  Dyspnea EXAM: CHEST  1 VIEW COMPARISON:  05/29/2020 FINDINGS: Small bilateral pleural effusions appears slightly increased. Enlarged cardiomediastinal silhouette with vascular congestion. Increased bibasilar airspace disease. No pneumothorax. IMPRESSION: 1. Cardiomegaly with vascular congestion. 2. Increasing bilateral pleural effusions and bibasilar atelectasis or  pneumonia. Electronically Signed   By: Donavan Foil M.D.   On: 05/29/2020 22:53   CT HEAD WO CONTRAST  Result Date: 05/30/2020 CLINICAL DATA:  Neuro deficit, acute, stroke suspected. Additional history provided: Patient experiencing increasing peripheral edema with shortness of breath over the last 10 days, history of epilepsy and sleep apnea. EXAM: CT HEAD WITHOUT CONTRAST TECHNIQUE: Contiguous axial images were obtained from the base of the skull through the vertex without intravenous contrast. COMPARISON:  CTA head/neck 02/28/2019, head CT 02/28/2019, brain MRI 02/28/2019. FINDINGS: Brain: Mild generalized parenchymal atrophy. Mild patchy hypodensity within the cerebral white matter is nonspecific, but consistent with chronic small vessel ischemic disease. These findings appear stable as compared to the prior head CT of 02/28/2019. There is no  acute intracranial hemorrhage. No demarcated cortical infarct is identified. No extra-axial fluid collection. No evidence of intracranial mass. No midline shift. Vascular: No hyperdense vessel.  Atherosclerotic calcifications Skull: Normal. Negative for fracture or focal lesion. Sinuses/Orbits: Visualized orbits show no acute finding. Mild ethmoid sinus mucosal thickening. No significant mastoid effusion IMPRESSION: No CT evidence of acute intracranial abnormality. Mild generalized parenchymal atrophy and chronic small vessel ischemic disease, stable as compared to the head CT of 02/28/2019. Mild ethmoid sinus mucosal thickening. Electronically Signed   By: Kellie Simmering DO   On: 05/30/2020 12:38   ECHOCARDIOGRAM COMPLETE  Result Date: 05/30/2020    ECHOCARDIOGRAM REPORT   Patient Name:   ANNMARIE PLEMMONS [facility] Date of Exam: 05/30/2020 Medical Rec #:  [id number]        Height:       63.0 in Accession #:    [id number]       Weight:       155.0 lb Date of Birth:  [birth year]       BSA:          1.735 m Patient Age:    34 years         BP:           114/64 mmHg Patient Gender: F                 HR:           78 bpm. Exam Location:  Inpatient Procedure: 2D Echo Indications:    dyspnea 786.09  History:        Patient has no prior history of Echocardiogram examinations. No                 prior cardiac hx on file.  Sonographer:    Jannett Celestine RDCS (AE) Referring Phys: [place]  1. Left ventricular ejection fraction, by estimation, is 60 to 65%. The left ventricle has normal function. The left ventricle has no regional wall motion abnormalities. Left ventricular diastolic parameters were normal.  2. Right ventricular systolic function is normal. The right ventricular size is normal.  3. The mitral valve is normal in structure. Mild mitral valve regurgitation. No evidence of mitral stenosis.  4. The aortic valve is normal in structure. Aortic valve regurgitation is not visualized. No aortic stenosis is present.  5. The inferior vena cava is dilated in size with >50% respiratory variability, suggesting right atrial pressure of 8 mmHg. Comparison(s): No prior Echocardiogram. Conclusion(s)/Recommendation(s): Flow velocities are increased across all valves consistent with high cardiac output. Consider sepsis, thyrotoxicosis, anemia, etc. FINDINGS  Left Ventricle: Left ventricular ejection fraction, by estimation, is 60 to 65%. The left ventricle has normal function. The left ventricle has no regional wall motion abnormalities. The left ventricular internal cavity size was normal in size. There is  no left ventricular hypertrophy. Left ventricular diastolic parameters were normal. Indeterminate filling pressures. Right Ventricle: The right ventricular size is normal. No increase in right ventricular wall thickness. Right ventricular systolic function is normal. Left Atrium: Left atrial size was normal in size. Right Atrium: Right atrial size was normal in size. Pericardium: Trivial pericardial effusion is present. The pericardial effusion is circumferential. Mitral Valve:  The mitral valve is normal in structure. Normal mobility of the mitral valve leaflets. Mild mitral valve regurgitation, with posteriorly-directed jet. No evidence of mitral valve stenosis. Tricuspid Valve: The tricuspid valve is normal in structure. Tricuspid valve regurgitation is not demonstrated. No evidence of tricuspid stenosis.  Aortic Valve: The aortic valve is normal in structure. Aortic valve regurgitation is not visualized. No aortic stenosis is present. Aortic valve mean gradient measures 10.0 mmHg. Aortic valve peak gradient measures 18.8 mmHg. Aortic valve area, by VTI measures 2.05 cm. Pulmonic Valve: The pulmonic valve was normal in structure. Pulmonic valve regurgitation is not visualized. No evidence of pulmonic stenosis. Aorta: The aortic root is normal in size and structure. Venous: The inferior vena cava is dilated in size with greater than 50% respiratory variability, suggesting right atrial pressure of 8 mmHg. IAS/Shunts: No atrial level shunt detected by color flow Doppler.  LEFT VENTRICLE PLAX 2D LVIDd:         4.80 cm  Diastology LVIDs:         3.20 cm  LV e' lateral:   9.68 cm/s LV PW:         1.10 cm  LV E/e' lateral: 12.6 LV IVS:        1.00 cm  LV e' medial:    8.00 cm/s LVOT diam:     1.90 cm  LV E/e' medial:  15.2 LV SV:         90 LV SV Index:   52 LVOT Area:     2.84 cm  RIGHT VENTRICLE RV Basal diam:  3.83 cm RV S prime:     15.30 cm/s TAPSE (M-mode): 3.3 cm LEFT ATRIUM             Index       RIGHT ATRIUM           Index LA diam:        3.50 cm 2.02 cm/m  RA Area:     18.30 cm LA Vol (A2C):   67.3 ml 38.79 ml/m RA Volume:   50.40 ml  29.05 ml/m LA Vol (A4C):   36.2 ml 20.86 ml/m LA Biplane Vol: 53.8 ml 31.01 ml/m  AORTIC VALVE AV Area (Vmax):    2.17 cm AV Area (Vmean):   1.98 cm AV Area (VTI):     2.05 cm AV Vmax:           217.00 cm/s AV Vmean:          150.000 cm/s AV VTI:            0.439 m AV Peak Grad:      18.8 mmHg AV Mean Grad:      10.0 mmHg LVOT Vmax:          166.00 cm/s LVOT Vmean:        105.000 cm/s LVOT VTI:          0.318 m LVOT/AV VTI ratio: 0.72  AORTA Ao Root diam: 2.90 cm MITRAL VALVE MV Area (PHT): 3.77 cm     SHUNTS MV Decel Time: 201 msec     Systemic VTI:  0.32 m MV E velocity: 122.00 cm/s  Systemic Diam: 1.90 cm MV A velocity: 88.30 cm/s MV E/A ratio:  1.38 Mihai Croitoru MD Electronically signed by Sanda Klein MD Signature Date/Time: 05/30/2020/9:08:12 AM    Final    US SPLEEN (ABDOMEN LIMITED)  Result Date: 05/30/2020 CLINICAL DATA:  64 year old female with splenomegaly. EXAM: ULTRASOUND ABDOMEN LIMITED COMPARISON:  None. FINDINGS: Targeted sonographic images of the left upper abdomen performed for assessment of the spleen. The spleen measures 11.8 x 4.9 x 11.6 cm for a volume of 350 cc. Partially visualized left pleural effusion. IMPRESSION: 1. No splenomegaly. 2. Left pleural effusion. Electronically Signed  By: Anner Crete M.D.   On: 05/30/2020 18:46   VAS [place] LOWER EXTREMITY VENOUS (DVT)  Result Date: 05/30/2020  Lower Venous DVTStudy Indications: Edema, and congestive heart failure.  Limitations: Poor ultrasound/tissue interface. Comparison Study: No prior study Performing Technologist: Maudry Mayhew MHA, RDMS, RVT, RDCS  Examination Guidelines: A complete evaluation includes B-mode imaging, spectral Doppler, color Doppler, and power Doppler as needed of all accessible portions of each vessel. Bilateral testing is considered an integral part of a complete examination. Limited examinations for reoccurring indications may be performed as noted. The reflux portion of the exam is performed with the patient in reverse Trendelenburg.  +---------+---------------+---------+-----------+----------+--------------+ RIGHT    CompressibilityPhasicitySpontaneityPropertiesThrombus Aging +---------+---------------+---------+-----------+----------+--------------+ CFV      Full           No       Yes                                  +---------+---------------+---------+-----------+----------+--------------+ SFJ      Full                                                        +---------+---------------+---------+-----------+----------+--------------+ FV Prox  Full                                                        +---------+---------------+---------+-----------+----------+--------------+ FV Mid   Full                                                        +---------+---------------+---------+-----------+----------+--------------+ FV DistalFull                                                        +---------+---------------+---------+-----------+----------+--------------+ PFV      Full                                                        +---------+---------------+---------+-----------+----------+--------------+ POP      Full           No       Yes                                 +---------+---------------+---------+-----------+----------+--------------+ PTV      Full                                                        +---------+---------------+---------+-----------+----------+--------------+  PERO     Full                                                        +---------+---------------+---------+-----------+----------+--------------+   Right Technical Findings: Not visualized segments include Limited evaluation right PTV, peroneal veins.  +---------+---------------+---------+-----------+----------+--------------+ LEFT     CompressibilityPhasicitySpontaneityPropertiesThrombus Aging +---------+---------------+---------+-----------+----------+--------------+ CFV      Full           No       Yes                                 +---------+---------------+---------+-----------+----------+--------------+ SFJ      Full                                                        +---------+---------------+---------+-----------+----------+--------------+ FV Prox  Full                                                         +---------+---------------+---------+-----------+----------+--------------+ FV Mid   Full                                                        +---------+---------------+---------+-----------+----------+--------------+ FV DistalFull                                                        +---------+---------------+---------+-----------+----------+--------------+ PFV      Full                                                        +---------+---------------+---------+-----------+----------+--------------+ POP      Full           No       Yes                                 +---------+---------------+---------+-----------+----------+--------------+ PTV      Full                                                        +---------+---------------+---------+-----------+----------+--------------+ PERO     Full                                                        +---------+---------------+---------+-----------+----------+--------------+  Left Technical Findings: Not visualized segments include Limited evaluation left PTV, peroneal veins.   Summary: RIGHT: - There is no evidence of deep vein thrombosis in the lower extremity. However, portions of this examination were limited- see technologist comments above.  - No cystic structure found in the popliteal fossa.  LEFT: - There is no evidence of deep vein thrombosis in the lower extremity. However, portions of this examination were limited- see technologist comments above.  - A complex cystic structure is found in the popliteal fossa.  *See table(s) above for measurements and observations. Electronically signed by Deitra Mayo MD on 05/30/2020 at 8:09:22 PM.    Final     Labs:  CBC: Recent Labs    05/29/20 2125 05/30/20 1400 05/30/20 2309 05/31/20 0545  WBC 1.4* 1.0*  --  1.1*  HGB 4.7* 4.5* 6.8* 6.8*  HCT 15.6* 14.4* 20.4* 20.8*  PLT 191 130*  --  138*     COAGS: No results for input(s): INR, APTT in the last 8760 hours.  BMP: Recent Labs    05/29/20 2125 05/31/20 0545  NA 137 138  K 4.1 3.7  CL 102 101  CO2 25 27  GLUCOSE 115* 102*  BUN 28* 25*  CALCIUM 8.3* 8.2*  CREATININE 1.20* 0.83  GFRNONAA 47* >60  GFRAA 55* >60    LIVER FUNCTION TESTS: Recent Labs    05/29/20 2125  BILITOT 0.6  AST 23  ALT 37  ALKPHOS 92  PROT 6.1*  ALBUMIN 3.8    TUMOR MARKERS: No results for input(s): AFPTM, CEA, CA199, CHROMGRNA in the last 8760 hours.  Assessment and Plan: 63 y.o. female with PMH seizures, schizophrenia, sleep apnea who was admitted to [facility] 8/2 secondary to fatigue/falls at home, dizziness,decreased appetite, LE edema, easy bruising,  abnormal lab work noted at PCP office. CBC on admission showed a WBC of 1.4, hemoglobin 4.7, hematocrit 15.6, and platelet count of 191,000.  Chemistry significant for a BUN of 28, creatinine 1.2, calcium 8.3, and total protein of 6.1.  Absolute reticulocytes were decreased at 17.0, vitamin B12 level normal at 197, folate normal at 12.6, ferritin normal at 169 with a normal iron, TIBC, percent saturation, and UIBC. She has been transfused. Today's CBC revealed WBC 1.1, hgb 6.8, plts 138k. Pt seen by oncology and request now received for CT guided bone marrow biopsy.Risks and benefits of procedure was discussed with the patient /daughter including, but not limited to bleeding, infection, damage to adjacent structures or low yield requiring additional tests.  All of the questions were answered and there is agreement to proceed.  Consent signed and in chart.  Procedure tent scheduled for 8/5 am    Thank you for this interesting consult.  I greatly enjoyed meeting Andrea Crawford and look forward to participating in their care.  A copy of this report was sent to the requesting provider on this date.  Electronically Signed: D. Rowe Robert, PA-C 05/31/2020, 9:47 AM   I spent a total of 25  minutes   in face to face in clinical consultation, greater than 50% of which was counseling/coordinating care for CT guided bone marrow biopsy

## 2020-05-31 NOTE — Progress Notes (Signed)
PROGRESS NOTE    Andrea Crawford  MVE:[id number] DOB: 1956/07/15 DOA: 05/29/2020 PCP: [name], MD   Chief Complaint  Patient presents with   Low Hemoglobin   Leg Swelling    Brief Narrative:  64 year old female with history of epilepsy on multiple antiepileptics (Onfi, Lamictal, Keppra, Vimpat) and schizophrenia who has been on Abilify for many years presents with complaints of dyspnea and dry cough for about 2 weeks associated with leg swellings.  Patient denies any chest pain or sputum production or dizziness or palpitations.  She reports lack of energy.  She apparently had 20 pound weight loss over the last year but started gaining weight about a month back.  She has not had any new medication changes except for Abilify dose increased from 10 mg to 15 mg (previously on 20 mg which was reduced to 10 mg and concern for confusion but again increased back to 15 mg a month back by primary psychiatrist Dr. [name] as schizophrenia was felt to be uncontrolled per daughter).  Given the symptoms patient presented to PCP and lab work revealed significant anemia with hemoglobin less than 5 and leukopenia prompting ED referral.  ED work-up revealed bilateral lower extremity edema, chest x-ray showing bilateral pleural effusion, cardiomegaly raising possibility of CHF versus pneumonia.  WBC 1.4 with neutrophils 9% and lymphocyte 50%, hemoglobin 4.7, platelets 191.  BNP 800.  EKG with NSR, QTC 491 ms, creatinine 1.2.  Patient received 2 units of PRBC transfusion overnight.  Doppler lower extremity negative for DVT.   Assessment & Plan:   Principal Problem:   Pancytopenia ([place]) Active Problems:   Anemia   Leucopenia   Peripheral edema   Epilepsy (HCC)   Sleep apnea   Schizophrenia (HCC)   Volume overload   Symptomatic anemia  #1 severe anemia/neutropenia/pancytopenia Questionable etiology.  Patient had presented with a neutropenia and significant anemia with hemoglobin of dropping as low as  4.5.  White count as low as 1.0.  Anemia panel with no evidence of iron deficiency, vitamin B12 levels with a low vitamin B12 however not deficient, no folate deficiency.  Patient noted to have an increase dose of Abilify approximately a month ago and currently on hold while work-up underway.  Patient status post 4 units packed red blood cells on 05/30/2020 hemoglobin currently at 6.8.  Transfuse 2 units packed red blood cells.  Patient seen in consultation by hematology oncology who are recommending bone marrow biopsy.  IR consulted for bone marrow biopsy hopefully to be done tomorrow.  Monitor labs.  Transfusion threshold hemoglobin < 7.  Hematology/oncology following and appreciate input and recommendations.  2.  Epilepsy Currently stable.  No seizures noted during this hospitalization.  Being followed by neurology at [facility].  Continue home regimen of Lamictal, Keppra, Vimpat, Onfi.  Follow.  3.  Volume overload Patient noted to be volume overloaded could be likely secondary to significant anemia.  2D echo with normal EF with no wall motion abnormalities.  Patient on IV Lasix with urine output of 2.1 L over the past 24 hours.  Patient is -1.59 L during this hospitalization.  Monitor closely with diuresis.  Follow.  4.  Schizophrenia Patient with recent increase of Abilify from 10 mg to 15 mg.  Abilify currently on hold pending work-up for neutropenia and anemia.  Currently stable.  Will defer to hematology when Abilify may be resumed.  Follow.  Will need outpatient follow-up with psychiatry.  5.  Somewhat garbled speech Chronic per daughter.  Monitor for now.  6.  Movement disorder Continue amantadine.  7.  OSA CPAP nightly.  8.  Acute renal failure Likely secondary to prerenal azotemia.  Improved with diuresis and transfusion.  Follow.  DVT prophylaxis: SCDs. Code Status: Full Family Communication: Updated patient and daughter at bedside. Disposition:   Status is:  Inpatient    Dispo: The patient is from: Home              Anticipated d/c is to: Home              Anticipated d/c date is: To be determined.              Patient currently with leukopenia, anemia currently receiving packed red blood cells.  Hemoglobin at 6.8.  Work-up underway.  Patient for bone marrow biopsy pending.  Not stable for discharge.       Consultants:   Hematology/oncology: Dr. Alvy Bimler 05/30/2020  IR 05/31/2020  Procedures:   2D echo 05/30/2020  Lower extremity Dopplers 05/30/2020  Ultrasound of the spleen 05/30/2020  Chest x-ray 05/29/2020  CT head 05/30/2020  4 units packed red blood cells 05/30/2020  2 units packed red blood cells 05/31/2020  Antimicrobials:  None   Subjective: Patient sitting up in bed.  Receiving a unit of packed red blood cells.  Denies chest pain.  Some complaints of shortness of breath.  Denies any bleeding.  Objective: Vitals:   05/31/20 1406 05/31/20 1536 05/31/20 1611 05/31/20 1907  BP: 109/60 115/62 104/61 110/61  Pulse: 72 73 69 73  Resp: '20 20 18 20  ' Temp: 98.4 F (36.9 C) 98.1 F (36.7 C) 98 F (36.7 C) 98 F (36.7 C)  TempSrc: Oral Oral Oral Oral  SpO2: 95% 94% 95% 96%  Weight:      Height:        Intake/Output Summary (Last 24 hours) at 05/31/2020 2027 Last data filed at 05/31/2020 1908 Gross per 24 hour  Intake 1745.66 ml  Output 1925 ml  Net -179.34 ml   Filed Weights   05/29/20 2059 05/31/20 0500  Weight: 70.3 kg 68.8 kg    Examination:  General exam: Appears calm and comfortable  Respiratory system: Decreased breath sounds in the bases.  No rhonchi.  Respiratory effort normal. Cardiovascular system: S1 & S2 heard, RRR. No JVD, murmurs, rubs, gallops or clicks. 1+ bilateral lower extremity edema. Gastrointestinal system: Abdomen is nondistended, soft and nontender. No organomegaly or masses felt. Normal bowel sounds heard. Central nervous system: Alert and oriented. No focal neurological deficits. Extremities:  Symmetric 5 x 5 power. Skin: No rashes, lesions or ulcers Psychiatry: Judgement and insight appear fair. Mood & affect appropriate.     Data Reviewed: I have personally reviewed following labs and imaging studies  CBC: Recent Labs  Lab 05/29/20 2125 05/30/20 1400 05/30/20 2309 05/31/20 0545  WBC 1.4* 1.0*  --  1.1*  NEUTROABS 0.1* 0.2*  --  0.2*  HGB 4.7* 4.5* 6.8* 6.8*  HCT 15.6* 14.4* 20.4* 20.8*  MCV 109.1* 106.7*  --  99.0  PLT 191 130*  --  138*    Basic Metabolic Panel: Recent Labs  Lab 05/29/20 2125 05/31/20 0545  NA 137 138  K 4.1 3.7  CL 102 101  CO2 25 27  GLUCOSE 115* 102*  BUN 28* 25*  CREATININE 1.20* 0.83  CALCIUM 8.3* 8.2*    GFR: Estimated Creatinine Clearance: 62.9 mL/min (by C-G formula based on SCr of 0.83 mg/dL).  Liver Function Tests:  Recent Labs  Lab 05/29/20 2125  AST 23  ALT 37  ALKPHOS 92  BILITOT 0.6  PROT 6.1*  ALBUMIN 3.8    CBG: No results for input(s): GLUCAP in the last 168 hours.   Recent Results (from the past 240 hour(s))  SARS Coronavirus 2 by RT PCR (hospital order, performed in [facility] hospital lab) Nasopharyngeal Nasopharyngeal Swab     Status: None   Collection Time: 05/29/20 10:24 PM   Specimen: Nasopharyngeal Swab  Result Value Ref Range Status   SARS Coronavirus 2 NEGATIVE NEGATIVE Final    Comment: (NOTE) SARS-CoV-2 target nucleic acids are NOT DETECTED.  The SARS-CoV-2 RNA is generally detectable in upper and lower respiratory specimens during the acute phase of infection. The lowest concentration of SARS-CoV-2 viral copies this assay can detect is 250 copies / mL. A negative result does not preclude SARS-CoV-2 infection and should not be used as the sole basis for treatment or other patient management decisions.  A negative result may occur with improper specimen collection / handling, submission of specimen other than nasopharyngeal swab, presence of viral mutation(s) within the areas targeted  by this assay, and inadequate number of viral copies (<250 copies / mL). A negative result must be combined with clinical observations, patient history, and epidemiological information.  Fact Sheet for Patients:   StrictlyIdeas.no  Fact Sheet for Healthcare Providers: BankingDealers.co.za  This test is not yet approved or  cleared by the [place] FDA and has been authorized for detection and/or diagnosis of SARS-CoV-2 by FDA under an Emergency Use Authorization (EUA).  This EUA will remain in effect (meaning this test can be used) for the duration of the COVID-19 declaration under Section 564(b)(1) of the Act, 21 U.S.C. section 360bbb-3(b)(1), unless the authorization is terminated or revoked sooner.  Performed at [facility], [place] [address] [place], [place] [address]          Radiology Studies: DG Chest 1 View  Result Date: 05/29/2020 CLINICAL DATA:  Dyspnea EXAM: CHEST  1 VIEW COMPARISON:  05/29/2020 FINDINGS: Small bilateral pleural effusions appears slightly increased. Enlarged cardiomediastinal silhouette with vascular congestion. Increased bibasilar airspace disease. No pneumothorax. IMPRESSION: 1. Cardiomegaly with vascular congestion. 2. Increasing bilateral pleural effusions and bibasilar atelectasis or pneumonia. Electronically Signed   By: Donavan Foil M.D.   On: 05/29/2020 22:53   CT HEAD WO CONTRAST  Result Date: 05/30/2020 CLINICAL DATA:  Neuro deficit, acute, stroke suspected. Additional history provided: Patient experiencing increasing peripheral edema with shortness of breath over the last 10 days, history of epilepsy and sleep apnea. EXAM: CT HEAD WITHOUT CONTRAST TECHNIQUE: Contiguous axial images were obtained from the base of the skull through the vertex without intravenous contrast. COMPARISON:  CTA head/neck 02/28/2019, head CT 02/28/2019, brain MRI 02/28/2019. FINDINGS: Brain: Mild  generalized parenchymal atrophy. Mild patchy hypodensity within the cerebral white matter is nonspecific, but consistent with chronic small vessel ischemic disease. These findings appear stable as compared to the prior head CT of 02/28/2019. There is no acute intracranial hemorrhage. No demarcated cortical infarct is identified. No extra-axial fluid collection. No evidence of intracranial mass. No midline shift. Vascular: No hyperdense vessel.  Atherosclerotic calcifications Skull: Normal. Negative for fracture or focal lesion. Sinuses/Orbits: Visualized orbits show no acute finding. Mild ethmoid sinus mucosal thickening. No significant mastoid effusion IMPRESSION: No CT evidence of acute intracranial abnormality. Mild generalized parenchymal atrophy and chronic small vessel ischemic disease, stable as compared to the head CT of 02/28/2019. Mild  ethmoid sinus mucosal thickening. Electronically Signed   By: Kellie Simmering DO   On: 05/30/2020 12:38   ECHOCARDIOGRAM COMPLETE  Result Date: 05/30/2020    ECHOCARDIOGRAM REPORT   Patient Name:   TYLYNN BRANIFF [facility] Date of Exam: 05/30/2020 Medical Rec #:  [id number]        Height:       63.0 in Accession #:    [id number]       Weight:       155.0 lb Date of Birth:  [birth year]       BSA:          1.735 m Patient Age:    33 years         BP:           114/64 mmHg Patient Gender: F                HR:           78 bpm. Exam Location:  Inpatient Procedure: 2D Echo Indications:    dyspnea 786.09  History:        Patient has no prior history of Echocardiogram examinations. No                 prior cardiac hx on file.  Sonographer:    Jannett Celestine RDCS (AE) Referring Phys: [place]  1. Left ventricular ejection fraction, by estimation, is 60 to 65%. The left ventricle has normal function. The left ventricle has no regional wall motion abnormalities. Left ventricular diastolic parameters were normal.  2. Right ventricular systolic function is normal. The  right ventricular size is normal.  3. The mitral valve is normal in structure. Mild mitral valve regurgitation. No evidence of mitral stenosis.  4. The aortic valve is normal in structure. Aortic valve regurgitation is not visualized. No aortic stenosis is present.  5. The inferior vena cava is dilated in size with >50% respiratory variability, suggesting right atrial pressure of 8 mmHg. Comparison(s): No prior Echocardiogram. Conclusion(s)/Recommendation(s): Flow velocities are increased across all valves consistent with high cardiac output. Consider sepsis, thyrotoxicosis, anemia, etc. FINDINGS  Left Ventricle: Left ventricular ejection fraction, by estimation, is 60 to 65%. The left ventricle has normal function. The left ventricle has no regional wall motion abnormalities. The left ventricular internal cavity size was normal in size. There is  no left ventricular hypertrophy. Left ventricular diastolic parameters were normal. Indeterminate filling pressures. Right Ventricle: The right ventricular size is normal. No increase in right ventricular wall thickness. Right ventricular systolic function is normal. Left Atrium: Left atrial size was normal in size. Right Atrium: Right atrial size was normal in size. Pericardium: Trivial pericardial effusion is present. The pericardial effusion is circumferential. Mitral Valve: The mitral valve is normal in structure. Normal mobility of the mitral valve leaflets. Mild mitral valve regurgitation, with posteriorly-directed jet. No evidence of mitral valve stenosis. Tricuspid Valve: The tricuspid valve is normal in structure. Tricuspid valve regurgitation is not demonstrated. No evidence of tricuspid stenosis. Aortic Valve: The aortic valve is normal in structure. Aortic valve regurgitation is not visualized. No aortic stenosis is present. Aortic valve mean gradient measures 10.0 mmHg. Aortic valve peak gradient measures 18.8 mmHg. Aortic valve area, by VTI measures 2.05 cm.  Pulmonic Valve: The pulmonic valve was normal in structure. Pulmonic valve regurgitation is not visualized. No evidence of pulmonic stenosis. Aorta: The aortic root is normal in size and structure. Venous: The inferior vena cava is dilated in  size with greater than 50% respiratory variability, suggesting right atrial pressure of 8 mmHg. IAS/Shunts: No atrial level shunt detected by color flow Doppler.  LEFT VENTRICLE PLAX 2D LVIDd:         4.80 cm  Diastology LVIDs:         3.20 cm  LV e' lateral:   9.68 cm/s LV PW:         1.10 cm  LV E/e' lateral: 12.6 LV IVS:        1.00 cm  LV e' medial:    8.00 cm/s LVOT diam:     1.90 cm  LV E/e' medial:  15.2 LV SV:         90 LV SV Index:   52 LVOT Area:     2.84 cm  RIGHT VENTRICLE RV Basal diam:  3.83 cm RV S prime:     15.30 cm/s TAPSE (M-mode): 3.3 cm LEFT ATRIUM             Index       RIGHT ATRIUM           Index LA diam:        3.50 cm 2.02 cm/m  RA Area:     18.30 cm LA Vol (A2C):   67.3 ml 38.79 ml/m RA Volume:   50.40 ml  29.05 ml/m LA Vol (A4C):   36.2 ml 20.86 ml/m LA Biplane Vol: 53.8 ml 31.01 ml/m  AORTIC VALVE AV Area (Vmax):    2.17 cm AV Area (Vmean):   1.98 cm AV Area (VTI):     2.05 cm AV Vmax:           217.00 cm/s AV Vmean:          150.000 cm/s AV VTI:            0.439 m AV Peak Grad:      18.8 mmHg AV Mean Grad:      10.0 mmHg LVOT Vmax:         166.00 cm/s LVOT Vmean:        105.000 cm/s LVOT VTI:          0.318 m LVOT/AV VTI ratio: 0.72  AORTA Ao Root diam: 2.90 cm MITRAL VALVE MV Area (PHT): 3.77 cm     SHUNTS MV Decel Time: 201 msec     Systemic VTI:  0.32 m MV E velocity: 122.00 cm/s  Systemic Diam: 1.90 cm MV A velocity: 88.30 cm/s MV E/A ratio:  1.38 Mihai Croitoru MD Electronically signed by Sanda Klein MD Signature Date/Time: 05/30/2020/9:08:12 AM    Final    US SPLEEN (ABDOMEN LIMITED)  Result Date: 05/30/2020 CLINICAL DATA:  64 year old female with splenomegaly. EXAM: ULTRASOUND ABDOMEN LIMITED COMPARISON:  None. FINDINGS:  Targeted sonographic images of the left upper abdomen performed for assessment of the spleen. The spleen measures 11.8 x 4.9 x 11.6 cm for a volume of 350 cc. Partially visualized left pleural effusion. IMPRESSION: 1. No splenomegaly. 2. Left pleural effusion. Electronically Signed   By: Anner Crete M.D.   On: 05/30/2020 18:46   VAS [place] LOWER EXTREMITY VENOUS (DVT)  Result Date: 05/30/2020  Lower Venous DVTStudy Indications: Edema, and congestive heart failure.  Limitations: Poor ultrasound/tissue interface. Comparison Study: No prior study Performing Technologist: Maudry Mayhew MHA, RDMS, RVT, RDCS  Examination Guidelines: A complete evaluation includes B-mode imaging, spectral Doppler, color Doppler, and power Doppler as needed of all accessible portions of each vessel. Bilateral testing is considered  an integral part of a complete examination. Limited examinations for reoccurring indications may be performed as noted. The reflux portion of the exam is performed with the patient in reverse Trendelenburg.  +---------+---------------+---------+-----------+----------+--------------+  RIGHT     Compressibility Phasicity Spontaneity Properties Thrombus Aging  +---------+---------------+---------+-----------+----------+--------------+  CFV       Full            No        Yes                                    +---------+---------------+---------+-----------+----------+--------------+  SFJ       Full                                                             +---------+---------------+---------+-----------+----------+--------------+  FV Prox   Full                                                             +---------+---------------+---------+-----------+----------+--------------+  FV Mid    Full                                                             +---------+---------------+---------+-----------+----------+--------------+  FV Distal Full                                                              +---------+---------------+---------+-----------+----------+--------------+  PFV       Full                                                             +---------+---------------+---------+-----------+----------+--------------+  POP       Full            No        Yes                                    +---------+---------------+---------+-----------+----------+--------------+  PTV       Full                                                             +---------+---------------+---------+-----------+----------+--------------+  PERO      Full                                                             +---------+---------------+---------+-----------+----------+--------------+  Right Technical Findings: Not visualized segments include Limited evaluation right PTV, peroneal veins.  +---------+---------------+---------+-----------+----------+--------------+  LEFT      Compressibility Phasicity Spontaneity Properties Thrombus Aging  +---------+---------------+---------+-----------+----------+--------------+  CFV       Full            No        Yes                                    +---------+---------------+---------+-----------+----------+--------------+  SFJ       Full                                                             +---------+---------------+---------+-----------+----------+--------------+  FV Prox   Full                                                             +---------+---------------+---------+-----------+----------+--------------+  FV Mid    Full                                                             +---------+---------------+---------+-----------+----------+--------------+  FV Distal Full                                                             +---------+---------------+---------+-----------+----------+--------------+  PFV       Full                                                             +---------+---------------+---------+-----------+----------+--------------+  POP       Full             No        Yes                                    +---------+---------------+---------+-----------+----------+--------------+  PTV       Full                                                             +---------+---------------+---------+-----------+----------+--------------+  PERO      Full                                                             +---------+---------------+---------+-----------+----------+--------------+  Left Technical Findings: Not visualized segments include Limited evaluation left PTV, peroneal veins.   Summary: RIGHT: - There is no evidence of deep vein thrombosis in the lower extremity. However, portions of this examination were limited- see technologist comments above.  - No cystic structure found in the popliteal fossa.  LEFT: - There is no evidence of deep vein thrombosis in the lower extremity. However, portions of this examination were limited- see technologist comments above.  - A complex cystic structure is found in the popliteal fossa.  *See table(s) above for measurements and observations. Electronically signed by Deitra Mayo MD on 05/30/2020 at 8:09:22 PM.    Final         Scheduled Meds:  sodium chloride   Intravenous Once   sodium chloride   Intravenous Once   sodium chloride   Intravenous Once   amantadine  100 mg Oral BID   vitamin C  1,000 mg Oral Daily   cloBAZam  5 mg Oral Daily   And   cloBAZam  15 mg Oral QHS   furosemide  20 mg Intravenous Q12H   lacosamide  50 mg Oral BID WC   And   lacosamide  150 mg Oral QHS   lamoTRIgine  200 mg Oral BID WC   And   lamoTRIgine  400 mg Oral QHS   levETIRAcetam  500 mg Oral BID WC   And   levETIRAcetam  1,000 mg Oral QHS   pantoprazole  40 mg Oral Daily   pyridOXINE  600 mg Oral Daily   zinc sulfate  220 mg Oral Daily   Continuous Infusions:   LOS: 2 days    Time spent: 35 minutes    Irine Seal, MD [facility]   To contact the attending provider  between 7A-7P or the covering provider during after hours 7P-7A, please log into the web site [email or web address] and access using universal [place] password for that web site. If you do not have the password, please call the hospital operator.  05/31/2020, 8:27 PM

## 2020-05-31 NOTE — Progress Notes (Signed)
Critical Lab  WBC 1.2 Provider made aware Will continue to monitor

## 2020-05-31 NOTE — Progress Notes (Signed)
Andrea Crawford   DOB:[birth year]   NB#:[id number]    ASSESSMENT & PLAN:  Severe pancytopenia Work-up to date has been reviewed which shows no evidence of iron deficiency, B12 deficiency, or folate deficiency CBC from 2-1/2 months ago did not show any evidence of anemia or neutropenia No recent new medications but did have a dose increase in Abilify about 1 month ago -I do not think this is the cause of her anemia and neutropenia however Her blood count has improved but still low I recommend 2 more units of blood transfusion today I explained to the patient, her daughter and husband the rationale behind bone marrow aspirate and biopsy and she is in agreement I have placed an order for that, hopefully can be done tomorrow We discussed some of the risks, benefits, and alternatives of blood transfusions. The patient is symptomatic from anemia and the hemoglobin level is critically low.  Some of the side-effects to be expected including risks of transfusion reactions, chills, infection, syndrome of volume overload and risk of hospitalization from various reasons and the patient is willing to proceed and went ahead to sign consent today.  Epilepsy Reports last seizure about 1 month ago Followed closely by neurology at [facility] on [place], Vimpat, Lamictal and Keppra Monitor closely for seizures  Somewhat garbled speech According to her daughter, this is not new Observe for now  Schizophrenia  The patient is followed by psychiatry as an outpatient Has been on Abilify with a recent dose increase Abilify currently on hold pending further work-up of neutropenia and anemia Her mental status is improved although his speech is somewhat garbled at times  Plan of care discussion I discussed the rationale behind bone marrow aspirate and biopsy extensively with the patient, her daughter and husband and they are in agreement  Discharge planning If her blood counts are stable after blood  transfusion and bone marrow biopsy, she can be discharged with follow-up with me in the outpatient next  All questions were answered. The patient knows to call the clinic with any problems, questions or concerns.   Andrea Lark, MD 05/31/2020 7:55 AM  Subjective:  She is alert when I walked in.  She denies pain Noted hematoma on the right upper extremity secondary to IV infiltration The patient denies any recent signs or symptoms of bleeding such as spontaneous epistaxis, hematuria or hematochezia.   Objective:  Vitals:   05/31/20 0003 05/31/20 0451  BP: (!) 100/58 (!) 101/53  Pulse: 71 73  Resp: 16 19  Temp: 98.1 F (36.7 C) 97.7 F (36.5 C)  SpO2: 91% 95%     Intake/Output Summary (Last 24 hours) at 05/31/2020 0755 Last data filed at 05/31/2020 0600 Gross per 24 hour  Intake 1730 ml  Output 2100 ml  Net -370 ml    GENERAL:alert, no distress and comfortable SKIN: Noted hematoma of the right upper extremity NEURO: alert & oriented x 3 with somewhat garbled speech, no focal motor/sensory deficits   Labs:  Recent Labs    05/29/20 2125 05/31/20 0545  NA 137 138  K 4.1 3.7  CL 102 101  CO2 25 27  GLUCOSE 115* 102*  BUN 28* 25*  CREATININE 1.20* 0.83  CALCIUM 8.3* 8.2*  GFRNONAA 47* >60  GFRAA 55* >60  PROT 6.1*  --   ALBUMIN 3.8  --   AST 23  --   ALT 37  --   ALKPHOS 92  --   BILITOT 0.6  --  Studies:  DG Chest 1 View  Result Date: 05/29/2020 CLINICAL DATA:  Dyspnea EXAM: CHEST  1 VIEW COMPARISON:  05/29/2020 FINDINGS: Small bilateral pleural effusions appears slightly increased. Enlarged cardiomediastinal silhouette with vascular congestion. Increased bibasilar airspace disease. No pneumothorax. IMPRESSION: 1. Cardiomegaly with vascular congestion. 2. Increasing bilateral pleural effusions and bibasilar atelectasis or pneumonia. Electronically Signed   By: Donavan Foil M.D.   On: 05/29/2020 22:53   CT HEAD WO CONTRAST  Result Date: 05/30/2020 CLINICAL DATA:   Neuro deficit, acute, stroke suspected. Additional history provided: Patient experiencing increasing peripheral edema with shortness of breath over the last 10 days, history of epilepsy and sleep apnea. EXAM: CT HEAD WITHOUT CONTRAST TECHNIQUE: Contiguous axial images were obtained from the base of the skull through the vertex without intravenous contrast. COMPARISON:  CTA head/neck 02/28/2019, head CT 02/28/2019, brain MRI 02/28/2019. FINDINGS: Brain: Mild generalized parenchymal atrophy. Mild patchy hypodensity within the cerebral white matter is nonspecific, but consistent with chronic small vessel ischemic disease. These findings appear stable as compared to the prior head CT of 02/28/2019. There is no acute intracranial hemorrhage. No demarcated cortical infarct is identified. No extra-axial fluid collection. No evidence of intracranial mass. No midline shift. Vascular: No hyperdense vessel.  Atherosclerotic calcifications Skull: Normal. Negative for fracture or focal lesion. Sinuses/Orbits: Visualized orbits show no acute finding. Mild ethmoid sinus mucosal thickening. No significant mastoid effusion IMPRESSION: No CT evidence of acute intracranial abnormality. Mild generalized parenchymal atrophy and chronic small vessel ischemic disease, stable as compared to the head CT of 02/28/2019. Mild ethmoid sinus mucosal thickening. Electronically Signed   By: Kellie Simmering DO   On: 05/30/2020 12:38   ECHOCARDIOGRAM COMPLETE  Result Date: 05/30/2020    ECHOCARDIOGRAM REPORT   Patient Name:   Andrea Crawford [facility] Date of Exam: 05/30/2020 Medical Rec #:  [id number]        Height:       63.0 in Accession #:    [id number]       Weight:       155.0 lb Date of Birth:  [birth year]       BSA:          1.735 m Patient Age:    64 years         BP:           114/64 mmHg Patient Gender: F                HR:           78 bpm. Exam Location:  Inpatient Procedure: 2D Echo Indications:    dyspnea 786.09  History:        Patient has no  prior history of Echocardiogram examinations. No                 prior cardiac hx on file.  Sonographer:    Jannett Celestine RDCS (AE) Referring Phys: [place]  1. Left ventricular ejection fraction, by estimation, is 60 to 65%. The left ventricle has normal function. The left ventricle has no regional wall motion abnormalities. Left ventricular diastolic parameters were normal.  2. Right ventricular systolic function is normal. The right ventricular size is normal.  3. The mitral valve is normal in structure. Mild mitral valve regurgitation. No evidence of mitral stenosis.  4. The aortic valve is normal in structure. Aortic valve regurgitation is not visualized. No aortic stenosis is present.  5. The inferior vena cava is dilated in size  with >50% respiratory variability, suggesting right atrial pressure of 8 mmHg. Comparison(s): No prior Echocardiogram. Conclusion(s)/Recommendation(s): Flow velocities are increased across all valves consistent with high cardiac output. Consider sepsis, thyrotoxicosis, anemia, etc. FINDINGS  Left Ventricle: Left ventricular ejection fraction, by estimation, is 60 to 65%. The left ventricle has normal function. The left ventricle has no regional wall motion abnormalities. The left ventricular internal cavity size was normal in size. There is  no left ventricular hypertrophy. Left ventricular diastolic parameters were normal. Indeterminate filling pressures. Right Ventricle: The right ventricular size is normal. No increase in right ventricular wall thickness. Right ventricular systolic function is normal. Left Atrium: Left atrial size was normal in size. Right Atrium: Right atrial size was normal in size. Pericardium: Trivial pericardial effusion is present. The pericardial effusion is circumferential. Mitral Valve: The mitral valve is normal in structure. Normal mobility of the mitral valve leaflets. Mild mitral valve regurgitation, with  posteriorly-directed jet. No evidence of mitral valve stenosis. Tricuspid Valve: The tricuspid valve is normal in structure. Tricuspid valve regurgitation is not demonstrated. No evidence of tricuspid stenosis. Aortic Valve: The aortic valve is normal in structure. Aortic valve regurgitation is not visualized. No aortic stenosis is present. Aortic valve mean gradient measures 10.0 mmHg. Aortic valve peak gradient measures 18.8 mmHg. Aortic valve area, by VTI measures 2.05 cm. Pulmonic Valve: The pulmonic valve was normal in structure. Pulmonic valve regurgitation is not visualized. No evidence of pulmonic stenosis. Aorta: The aortic root is normal in size and structure. Venous: The inferior vena cava is dilated in size with greater than 50% respiratory variability, suggesting right atrial pressure of 8 mmHg. IAS/Shunts: No atrial level shunt detected by color flow Doppler.  LEFT VENTRICLE PLAX 2D LVIDd:         4.80 cm  Diastology LVIDs:         3.20 cm  LV e' lateral:   9.68 cm/s LV PW:         1.10 cm  LV E/e' lateral: 12.6 LV IVS:        1.00 cm  LV e' medial:    8.00 cm/s LVOT diam:     1.90 cm  LV E/e' medial:  15.2 LV SV:         90 LV SV Index:   52 LVOT Area:     2.84 cm  RIGHT VENTRICLE RV Basal diam:  3.83 cm RV S prime:     15.30 cm/s TAPSE (M-mode): 3.3 cm LEFT ATRIUM             Index       RIGHT ATRIUM           Index LA diam:        3.50 cm 2.02 cm/m  RA Area:     18.30 cm LA Vol (A2C):   67.3 ml 38.79 ml/m RA Volume:   50.40 ml  29.05 ml/m LA Vol (A4C):   36.2 ml 20.86 ml/m LA Biplane Vol: 53.8 ml 31.01 ml/m  AORTIC VALVE AV Area (Vmax):    2.17 cm AV Area (Vmean):   1.98 cm AV Area (VTI):     2.05 cm AV Vmax:           217.00 cm/s AV Vmean:          150.000 cm/s AV VTI:            0.439 m AV Peak Grad:      18.8 mmHg AV Mean Grad:  10.0 mmHg LVOT Vmax:         166.00 cm/s LVOT Vmean:        105.000 cm/s LVOT VTI:          0.318 m LVOT/AV VTI ratio: 0.72  AORTA Ao Root diam: 2.90 cm  MITRAL VALVE MV Area (PHT): 3.77 cm     SHUNTS MV Decel Time: 201 msec     Systemic VTI:  0.32 m MV E velocity: 122.00 cm/s  Systemic Diam: 1.90 cm MV A velocity: 88.30 cm/s MV E/A ratio:  1.38 Mihai Croitoru MD Electronically signed by Sanda Klein MD Signature Date/Time: 05/30/2020/9:08:12 AM    Final    US SPLEEN (ABDOMEN LIMITED)  Result Date: 05/30/2020 CLINICAL DATA:  64 year old female with splenomegaly. EXAM: ULTRASOUND ABDOMEN LIMITED COMPARISON:  None. FINDINGS: Targeted sonographic images of the left upper abdomen performed for assessment of the spleen. The spleen measures 11.8 x 4.9 x 11.6 cm for a volume of 350 cc. Partially visualized left pleural effusion. IMPRESSION: 1. No splenomegaly. 2. Left pleural effusion. Electronically Signed   By: Anner Crete M.D.   On: 05/30/2020 18:46   VAS [place] LOWER EXTREMITY VENOUS (DVT)  Result Date: 05/30/2020  Lower Venous DVTStudy Indications: Edema, and congestive heart failure.  Limitations: Poor ultrasound/tissue interface. Comparison Study: No prior study Performing Technologist: Maudry Mayhew MHA, RDMS, RVT, RDCS  Examination Guidelines: A complete evaluation includes B-mode imaging, spectral Doppler, color Doppler, and power Doppler as needed of all accessible portions of each vessel. Bilateral testing is considered an integral part of a complete examination. Limited examinations for reoccurring indications may be performed as noted. The reflux portion of the exam is performed with the patient in reverse Trendelenburg.  +---------+---------------+---------+-----------+----------+--------------+ RIGHT    CompressibilityPhasicitySpontaneityPropertiesThrombus Aging +---------+---------------+---------+-----------+----------+--------------+ CFV      Full           No       Yes                                 +---------+---------------+---------+-----------+----------+--------------+ SFJ      Full                                                         +---------+---------------+---------+-----------+----------+--------------+ FV Prox  Full                                                        +---------+---------------+---------+-----------+----------+--------------+ FV Mid   Full                                                        +---------+---------------+---------+-----------+----------+--------------+ FV DistalFull                                                        +---------+---------------+---------+-----------+----------+--------------+  PFV      Full                                                        +---------+---------------+---------+-----------+----------+--------------+ POP      Full           No       Yes                                 +---------+---------------+---------+-----------+----------+--------------+ PTV      Full                                                        +---------+---------------+---------+-----------+----------+--------------+ PERO     Full                                                        +---------+---------------+---------+-----------+----------+--------------+   Right Technical Findings: Not visualized segments include Limited evaluation right PTV, peroneal veins.  +---------+---------------+---------+-----------+----------+--------------+ LEFT     CompressibilityPhasicitySpontaneityPropertiesThrombus Aging +---------+---------------+---------+-----------+----------+--------------+ CFV      Full           No       Yes                                 +---------+---------------+---------+-----------+----------+--------------+ SFJ      Full                                                        +---------+---------------+---------+-----------+----------+--------------+ FV Prox  Full                                                         +---------+---------------+---------+-----------+----------+--------------+ FV Mid   Full                                                        +---------+---------------+---------+-----------+----------+--------------+ FV DistalFull                                                        +---------+---------------+---------+-----------+----------+--------------+ PFV      Full                                                        +---------+---------------+---------+-----------+----------+--------------+  POP      Full           No       Yes                                 +---------+---------------+---------+-----------+----------+--------------+ PTV      Full                                                        +---------+---------------+---------+-----------+----------+--------------+ PERO     Full                                                        +---------+---------------+---------+-----------+----------+--------------+   Left Technical Findings: Not visualized segments include Limited evaluation left PTV, peroneal veins.   Summary: RIGHT: - There is no evidence of deep vein thrombosis in the lower extremity. However, portions of this examination were limited- see technologist comments above.  - No cystic structure found in the popliteal fossa.  LEFT: - There is no evidence of deep vein thrombosis in the lower extremity. However, portions of this examination were limited- see technologist comments above.  - A complex cystic structure is found in the popliteal fossa.  *See table(s) above for measurements and observations. Electronically signed by Deitra Mayo MD on 05/30/2020 at 8:09:22 PM.    Final

## 2020-06-01 ENCOUNTER — Inpatient Hospital Stay (HOSPITAL_COMMUNITY)
Admit: 2020-06-01 | Discharge: 2020-06-01 | Disposition: A | Discharge status: Home / Self Care | Payer: Medicare Other | Attending: Hematology and Oncology | Admitting: Hematology and Oncology

## 2020-06-01 ENCOUNTER — Other Ambulatory Visit: Payer: Self-pay

## 2020-06-01 ENCOUNTER — Inpatient Hospital Stay (HOSPITAL_COMMUNITY): Payer: Medicare Other

## 2020-06-01 DIAGNOSIS — D61818 Other pancytopenia: Principal | ICD-10-CM

## 2020-06-01 DIAGNOSIS — G4730 Sleep apnea, unspecified: Secondary | ICD-10-CM

## 2020-06-01 DIAGNOSIS — R569 Unspecified convulsions: Secondary | ICD-10-CM

## 2020-06-01 LAB — AMMONIA: Ammonia: 28 umol/L (ref 9–35)

## 2020-06-01 LAB — CBC WITH DIFFERENTIAL/PLATELET
Abs Immature Granulocytes: 0 10*3/uL (ref 0.00–0.07)
Basophils Absolute: 0 10*3/uL (ref 0.0–0.1)
Basophils Relative: 1 %
Eosinophils Absolute: 0.1 10*3/uL (ref 0.0–0.5)
Eosinophils Relative: 5 %
HCT: 30.5 % — ABNORMAL LOW (ref 36.0–46.0)
Hemoglobin: 10 g/dL — ABNORMAL LOW (ref 12.0–15.0)
Immature Granulocytes: 0 %
Lymphocytes Relative: 50 %
Lymphs Abs: 0.7 10*3/uL (ref 0.7–4.0)
MCH: 31.8 pg (ref 26.0–34.0)
MCHC: 32.8 g/dL (ref 30.0–36.0)
MCV: 97.1 fL (ref 80.0–100.0)
Monocytes Absolute: 0.4 10*3/uL (ref 0.1–1.0)
Monocytes Relative: 34 %
Neutro Abs: 0.1 10*3/uL — ABNORMAL LOW (ref 1.7–7.7)
Neutrophils Relative %: 10 %
Platelets: 159 10*3/uL (ref 150–400)
RBC: 3.14 MIL/uL — ABNORMAL LOW (ref 3.87–5.11)
RDW: 21.9 % — ABNORMAL HIGH (ref 11.5–15.5)
WBC: 1.3 10*3/uL — CL (ref 4.0–10.5)
nRBC: 0 % (ref 0.0–0.2)

## 2020-06-01 LAB — TYPE AND SCREEN
ABO/RH(D): A POS
Antibody Screen: NEGATIVE
Unit division: 0
Unit division: 0
Unit division: 0
Unit division: 0
Unit division: 0
Unit division: 0

## 2020-06-01 LAB — BPAM RBC
Blood Product Expiration Date: 202108192359
Blood Product Expiration Date: 202108212359
Blood Product Expiration Date: 202108212359
Blood Product Expiration Date: 202108212359
Blood Product Expiration Date: 202108222359
Blood Product Expiration Date: 202108222359
ISSUE DATE / TIME: 202108030037
ISSUE DATE / TIME: 202108030514
ISSUE DATE / TIME: 202108031654
ISSUE DATE / TIME: 202108031855
ISSUE DATE / TIME: 202108041117
ISSUE DATE / TIME: 202108041547
Unit Type and Rh: 6200
Unit Type and Rh: 6200
Unit Type and Rh: 6200
Unit Type and Rh: 6200
Unit Type and Rh: 6200
Unit Type and Rh: 6200

## 2020-06-01 LAB — COMPREHENSIVE METABOLIC PANEL
ALT: 35 U/L (ref 0–44)
AST: 23 U/L (ref 15–41)
Albumin: 3.7 g/dL (ref 3.5–5.0)
Alkaline Phosphatase: 103 U/L (ref 38–126)
Anion gap: 12 (ref 5–15)
BUN: 20 mg/dL (ref 8–23)
CO2: 28 mmol/L (ref 22–32)
Calcium: 8.3 mg/dL — ABNORMAL LOW (ref 8.9–10.3)
Chloride: 98 mmol/L (ref 98–111)
Creatinine, Ser: 0.85 mg/dL (ref 0.44–1.00)
GFR calc Af Amer: >60 mL/min (ref >60)
GFR calc non Af Amer: >60 mL/min (ref >60)
Glucose, Bld: 102 mg/dL — ABNORMAL HIGH (ref 70–99)
Potassium: 4 mmol/L (ref 3.5–5.1)
Sodium: 138 mmol/L (ref 135–145)
Total Bilirubin: 1.2 mg/dL (ref 0.3–1.2)
Total Protein: 6.1 g/dL — ABNORMAL LOW (ref 6.5–8.1)

## 2020-06-01 LAB — PROTIME-INR
INR: 1.1 (ref 0.8–1.2)
Prothrombin Time: 13.7 seconds (ref 11.4–15.2)

## 2020-06-01 MED ORDER — MIDAZOLAM HCL 2 MG/2ML IJ SOLN
INTRAMUSCULAR | Status: AC | PRN
  Administered 2020-06-01: 1 mg via INTRAVENOUS

## 2020-06-01 MED ORDER — FENTANYL CITRATE (PF) 100 MCG/2ML IJ SOLN
INTRAMUSCULAR | Status: AC
  Filled 2020-06-01: qty 2

## 2020-06-01 MED ORDER — LIDOCAINE HCL (PF) 1 % IJ SOLN
INTRAMUSCULAR | Status: AC | PRN
  Administered 2020-06-01: 10 mL

## 2020-06-01 MED ORDER — CYANOCOBALAMIN 1000 MCG/ML IJ SOLN
1000.0000 ug | Freq: Every day | INTRAMUSCULAR | Status: DC
  Administered 2020-06-01 – 2020-06-05 (×5): 1000 ug via SUBCUTANEOUS
  Filled 2020-06-01 (×5): qty 1

## 2020-06-01 MED ORDER — MIDAZOLAM HCL 2 MG/2ML IJ SOLN
INTRAMUSCULAR | Status: AC
  Filled 2020-06-01: qty 4

## 2020-06-01 MED ORDER — ARIPIPRAZOLE 10 MG PO TABS
15.0000 mg | ORAL_TABLET | Freq: Every day | ORAL | Status: DC
  Administered 2020-06-01 – 2020-06-04 (×4): 15 mg via ORAL
  Filled 2020-06-01 (×2): qty 1
  Filled 2020-06-01 (×2): qty 2

## 2020-06-01 MED ORDER — LORAZEPAM 2 MG/ML IJ SOLN
1.0000 mg | Freq: Once | INTRAMUSCULAR | Status: AC
  Administered 2020-06-01: 1 mg via INTRAVENOUS
  Filled 2020-06-01: qty 1

## 2020-06-01 NOTE — Progress Notes (Signed)
Pt refusing morning vitals at this time.

## 2020-06-01 NOTE — Progress Notes (Signed)
Lab attempted to draw patient labs.  Patient slapped labs hand multiple times and said "your not going to touch me."

## 2020-06-01 NOTE — Procedures (Signed)
Interventional Radiology Procedure Note  Procedure: CT guided aspirate and core biopsy of right iliac bone Complications: None Recommendations: - Bedrest supine x 1 hrs - Hydrocodone PRN  Pain - Follow biopsy results  Signed,  Eswin Worrell K. Keiandre Cygan, MD   

## 2020-06-01 NOTE — Progress Notes (Signed)
Went to get pt for scan around 2100 pt refused scan stating its supposed to be down tomorrow. Spoke with pt that we can get it down tonight but still refused wanting it done tomorrow.

## 2020-06-01 NOTE — Progress Notes (Signed)
Patient has had increased confusion. Writer walked into pts room and she stated she had a "meeting with the president, her husband and the doctor" Pt proceeded to swing at staff and say dont touch me. Writer asked pt to please put O2 in nose, pt refused for several minutes.  Tech went into room and pt apologized and stated she "wanted to kiss"  Writer continues to reorient patient and keep her safe.

## 2020-06-01 NOTE — Progress Notes (Signed)
PROGRESS NOTE    Andrea Crawford  SNK:[id number] DOB: [birth year] DOA: 05/29/2020 PCP: [name], MD   Chief Complaint  Patient presents with   Low Hemoglobin   Leg Swelling    Brief Narrative:  64 year old female with history of epilepsy on multiple antiepileptics (Onfi, Lamictal, Keppra, Vimpat) and schizophrenia who has been on Abilify for many years presents with complaints of dyspnea and dry cough for about 2 weeks associated with leg swellings.  Patient denies any chest pain or sputum production or dizziness or palpitations.  She reports lack of energy.  She apparently had 20 pound weight loss over the last year but started gaining weight about a month back.  She has not had any new medication changes except for Abilify dose increased from 10 mg to 15 mg (previously on 20 mg which was reduced to 10 mg and concern for confusion but again increased back to 15 mg a month back by primary psychiatrist Dr. [name] as schizophrenia was felt to be uncontrolled per daughter).  Given the symptoms patient presented to PCP and lab work revealed significant anemia with hemoglobin less than 5 and leukopenia prompting ED referral.  ED work-up revealed bilateral lower extremity edema, chest x-ray showing bilateral pleural effusion, cardiomegaly raising possibility of CHF versus pneumonia.  WBC 1.4 with neutrophils 9% and lymphocyte 50%, hemoglobin 4.7, platelets 191.  BNP 800.  EKG with NSR, QTC 491 ms, creatinine 1.2.  Patient received 2 units of PRBC transfusion overnight.  Doppler lower extremity negative for DVT.   Assessment & Plan:   Principal Problem:   Pancytopenia ([place]) Active Problems:   Anemia   Leucopenia   Peripheral edema   Epilepsy (HCC)   Sleep apnea   Schizophrenia (HCC)   Volume overload   Symptomatic anemia  1 severe anemia/neutropenia/pancytopenia ?? etiology.  Patient had presented with a neutropenia and significant anemia with hemoglobin of dropping as low as 4.5.   White count as low as 1.0.  Anemia panel with no evidence of iron deficiency, vitamin B12 levels with a low vitamin B12 however not deficient, no folate deficiency.  Patient noted to have an increase dose of Abilify approximately a month ago and currently on hold while work-up underway.  Patient status post 4 units packed red blood cells on 05/30/2020, and 2 units transfusion of packed red blood cells 05/31/2020.  Hemoglobin currently at 10.0.  WBC at 1.3.  Platelet count at 159.  Will place patient on vitamin B12 1000 MCG's IM daily x7 days, then weekly, then monthly.  Patient for bone marrow biopsy today.  Transfusion threshold hemoglobin < 7.  Hematology/oncology following and appreciate input and recommendations.  2.  Bitemporal epilepsy Currently stable.  No seizures noted during this hospitalization.  Being followed by neurology at [facility].  Has been concerned about intermittent confusion and garbled/slurred speech.  Continue home regimen of Lamictal, Keppra, Vimpat, Onfi.  Neurology consultation.  Follow.  3.  Volume overload Patient noted to be volume overloaded could be likely secondary to significant anemia.  2D echo with normal EF with no wall motion abnormalities.  Patient on IV Lasix with urine output of 3.275 L over the past 24 hours.  Patient is - 2.437 L during this hospitalization.  Monitor closely with diuresis.  Follow.  4.  Schizophrenia Patient with recent increase of Abilify from 10 mg to 15 mg.  Abilify currently on hold pending work-up for neutropenia and anemia.  Currently stable.  Discussed with neurology who feel  Abilify likely not the cause of patient's pancytopenia and okay with resuming patient's Abilify.  Will resume Abilify at 15 mg nightly.  Will need outpatient follow-up with psychiatry.    5.  Somewhat garbled speech/intermittent confusion. Per husband patient is garbled/slurred speech has worsened recently.  Husband also concern of some intermittent confusion that has  been ongoing.  Head CT which was done negative for any acute abnormalities, mild generalized parenchymal atrophy and chronic small vessel ischemic disease stable.  Has been concerned about patient's intermittent confusion and worsening speech requesting neurology evaluation.  We will check a RPR, ammonia level.  TSH within normal limits.  Vitamin B12 on the low side of normal.  Will place on vitamin B12 supplementation.  Consult with neurology for further evaluation and management.  Follow.   6.  Movement disorder Continue amantadine.  7.  OSA Continue CPAP nightly.  8.  Acute renal failure Likely secondary to prerenal azotemia.  Improved with diuresis and transfusion.  Follow.  DVT prophylaxis: SCDs. Code Status: Full Family Communication: Updated patient and husband at bedside. Disposition:   Status is: Inpatient    Dispo: The patient is from: Home              Anticipated d/c is to: Home              Anticipated d/c date is: To be determined.              Patient currently with leukopenia, anemia currently receiving packed red blood cells.  Hemoglobin at 10.0.  Patient for bone marrow biopsy today.  Husband concerned about worsening confusion.  Currently not stable for discharge.         Consultants:   Hematology/oncology: Dr. Alvy Bimler 05/30/2020  IR 05/31/2020  Neurology pending  Procedures:   2D echo 05/30/2020  Lower extremity Dopplers 05/30/2020  Ultrasound of the spleen 05/30/2020  Chest x-ray 05/29/2020  CT head 05/30/2020  4 units packed red blood cells 05/30/2020  2 units packed red blood cells 05/31/2020  Bone marrow biopsy 06/01/2020  Antimicrobials:  None   Subjective: Patient on the way to bone marrow biopsy.  Has been at bedside.  Patient denies any chest pain.  No significant shortness of breath.  Patient denies any bleeding.  Has been concerned about intermittent confusion that has been ongoing and feels patient is speech is more slurred recently and  requesting evaluation by neurology.  Husband also concerned about patient's Abilify being on hold.  Objective: Vitals:   05/31/20 2125 06/01/20 0000 06/01/20 0007 06/01/20 0014  BP: 110/65     Pulse: 71     Resp: 20 19 (!) 29 19  Temp: 97.9 F (36.6 C)     TempSrc: Oral     SpO2: 95%     Weight:      Height:        Intake/Output Summary (Last 24 hours) at 06/01/2020 1058 Last data filed at 06/01/2020 0500 Gross per 24 hour  Intake 822.33 ml  Output 2775 ml  Net -1952.67 ml   Filed Weights   05/29/20 2059 05/31/20 0500  Weight: 70.3 kg 68.8 kg    Examination:  General exam: NAD. Respiratory system: Clear to auscultation anterior lung fields.  No wheezes, no crackles, no rhonchi.  Normal respiratory effort.  Cardiovascular system: RRR no murmurs rubs or gallops.  No JVD.  Trace to 1+ bilateral lower extremity edema.  Gastrointestinal system: Abdomen is soft, nontender, nondistended, positive bowel sounds.  No  rebound.  No guarding.  Central nervous system: Alert and oriented.  Patient with some slurred/garbled speech.  Moving extremities spontaneously.  No focal neurological deficits. Extremities: Symmetric 5 x 5 power. Skin: No rashes, lesions or ulcers Psychiatry: Judgement and insight appear fair. Mood & affect appropriate.     Data Reviewed: I have personally reviewed following labs and imaging studies  CBC: Recent Labs  Lab 05/29/20 2125 05/29/20 2125 05/30/20 1400 05/30/20 2309 05/31/20 0545 05/31/20 2130 06/01/20 0950  WBC 1.4*  --  1.0*  --  1.1* 1.3* 1.3*  NEUTROABS 0.1*  --  0.2*  --  0.2* 0.2* PENDING  HGB 4.7*   < > 4.5* 6.8* 6.8* 9.4* 10.0*  HCT 15.6*   < > 14.4* 20.4* 20.8* 27.5* 30.5*  MCV 109.1*  --  106.7*  --  99.0 94.8 97.1  PLT 191  --  130*  --  138* 138* 159   < > = values in this interval not displayed.    Basic Metabolic Panel: Recent Labs  Lab 05/29/20 2125 05/31/20 0545 06/01/20 0950  NA 137 138 138  K 4.1 3.7 4.0  CL 102 101  98  CO2 '25 27 28  ' GLUCOSE 115* 102* 102*  BUN 28* 25* 20  CREATININE 1.20* 0.83 0.85  CALCIUM 8.3* 8.2* 8.3*    GFR: Estimated Creatinine Clearance: 61.5 mL/min (by C-G formula based on SCr of 0.85 mg/dL).  Liver Function Tests: Recent Labs  Lab 05/29/20 2125 06/01/20 0950  AST 23 23  ALT 37 35  ALKPHOS 92 103  BILITOT 0.6 1.2  PROT 6.1* 6.1*  ALBUMIN 3.8 3.7    CBG: No results for input(s): GLUCAP in the last 168 hours.   Recent Results (from the past 240 hour(s))  SARS Coronavirus 2 by RT PCR (hospital order, performed in [facility] hospital lab) Nasopharyngeal Nasopharyngeal Swab     Status: None   Collection Time: 05/29/20 10:24 PM   Specimen: Nasopharyngeal Swab  Result Value Ref Range Status   SARS Coronavirus 2 NEGATIVE NEGATIVE Final    Comment: (NOTE) SARS-CoV-2 target nucleic acids are NOT DETECTED.  The SARS-CoV-2 RNA is generally detectable in upper and lower respiratory specimens during the acute phase of infection. The lowest concentration of SARS-CoV-2 viral copies this assay can detect is 250 copies / mL. A negative result does not preclude SARS-CoV-2 infection and should not be used as the sole basis for treatment or other patient management decisions.  A negative result may occur with improper specimen collection / handling, submission of specimen other than nasopharyngeal swab, presence of viral mutation(s) within the areas targeted by this assay, and inadequate number of viral copies (<250 copies / mL). A negative result must be combined with clinical observations, patient history, and epidemiological information.  Fact Sheet for Patients:   StrictlyIdeas.no  Fact Sheet for Healthcare Providers: BankingDealers.co.za  This test is not yet approved or  cleared by the [place] FDA and has been authorized for detection and/or diagnosis of SARS-CoV-2 by FDA under an Emergency Use Authorization  (EUA).  This EUA will remain in effect (meaning this test can be used) for the duration of the COVID-19 declaration under Section 564(b)(1) of the Act, 21 U.S.C. section 360bbb-3(b)(1), unless the authorization is terminated or revoked sooner.  Performed at [facility], [place] [address] [place], [place] [address]          Radiology Studies: CT HEAD WO CONTRAST  Result Date: 05/30/2020 CLINICAL  DATA:  Neuro deficit, acute, stroke suspected. Additional history provided: Patient experiencing increasing peripheral edema with shortness of breath over the last 10 days, history of epilepsy and sleep apnea. EXAM: CT HEAD WITHOUT CONTRAST TECHNIQUE: Contiguous axial images were obtained from the base of the skull through the vertex without intravenous contrast. COMPARISON:  CTA head/neck 02/28/2019, head CT 02/28/2019, brain MRI 02/28/2019. FINDINGS: Brain: Mild generalized parenchymal atrophy. Mild patchy hypodensity within the cerebral white matter is nonspecific, but consistent with chronic small vessel ischemic disease. These findings appear stable as compared to the prior head CT of 02/28/2019. There is no acute intracranial hemorrhage. No demarcated cortical infarct is identified. No extra-axial fluid collection. No evidence of intracranial mass. No midline shift. Vascular: No hyperdense vessel.  Atherosclerotic calcifications Skull: Normal. Negative for fracture or focal lesion. Sinuses/Orbits: Visualized orbits show no acute finding. Mild ethmoid sinus mucosal thickening. No significant mastoid effusion IMPRESSION: No CT evidence of acute intracranial abnormality. Mild generalized parenchymal atrophy and chronic small vessel ischemic disease, stable as compared to the head CT of 02/28/2019. Mild ethmoid sinus mucosal thickening. Electronically Signed   By: Kellie Simmering DO   On: 05/30/2020 12:38   US SPLEEN (ABDOMEN LIMITED)  Result Date: 05/30/2020 CLINICAL DATA:  64 year old  female with splenomegaly. EXAM: ULTRASOUND ABDOMEN LIMITED COMPARISON:  None. FINDINGS: Targeted sonographic images of the left upper abdomen performed for assessment of the spleen. The spleen measures 11.8 x 4.9 x 11.6 cm for a volume of 350 cc. Partially visualized left pleural effusion. IMPRESSION: 1. No splenomegaly. 2. Left pleural effusion. Electronically Signed   By: Anner Crete M.D.   On: 05/30/2020 18:46   VAS [place] LOWER EXTREMITY VENOUS (DVT)  Result Date: 05/30/2020  Lower Venous DVTStudy Indications: Edema, and congestive heart failure.  Limitations: Poor ultrasound/tissue interface. Comparison Study: No prior study Performing Technologist: Maudry Mayhew MHA, RDMS, RVT, RDCS  Examination Guidelines: A complete evaluation includes B-mode imaging, spectral Doppler, color Doppler, and power Doppler as needed of all accessible portions of each vessel. Bilateral testing is considered an integral part of a complete examination. Limited examinations for reoccurring indications may be performed as noted. The reflux portion of the exam is performed with the patient in reverse Trendelenburg.  +---------+---------------+---------+-----------+----------+--------------+  RIGHT     Compressibility Phasicity Spontaneity Properties Thrombus Aging  +---------+---------------+---------+-----------+----------+--------------+  CFV       Full            No        Yes                                    +---------+---------------+---------+-----------+----------+--------------+  SFJ       Full                                                             +---------+---------------+---------+-----------+----------+--------------+  FV Prox   Full                                                             +---------+---------------+---------+-----------+----------+--------------+  FV Mid    Full                                                              +---------+---------------+---------+-----------+----------+--------------+  FV Distal Full                                                             +---------+---------------+---------+-----------+----------+--------------+  PFV       Full                                                             +---------+---------------+---------+-----------+----------+--------------+  POP       Full            No        Yes                                    +---------+---------------+---------+-----------+----------+--------------+  PTV       Full                                                             +---------+---------------+---------+-----------+----------+--------------+  PERO      Full                                                             +---------+---------------+---------+-----------+----------+--------------+   Right Technical Findings: Not visualized segments include Limited evaluation right PTV, peroneal veins.  +---------+---------------+---------+-----------+----------+--------------+  LEFT      Compressibility Phasicity Spontaneity Properties Thrombus Aging  +---------+---------------+---------+-----------+----------+--------------+  CFV       Full            No        Yes                                    +---------+---------------+---------+-----------+----------+--------------+  SFJ       Full                                                             +---------+---------------+---------+-----------+----------+--------------+  FV Prox   Full                                                             +---------+---------------+---------+-----------+----------+--------------+  FV Mid    Full                                                             +---------+---------------+---------+-----------+----------+--------------+  FV Distal Full                                                             +---------+---------------+---------+-----------+----------+--------------+  PFV       Full                                                              +---------+---------------+---------+-----------+----------+--------------+  POP       Full            No        Yes                                    +---------+---------------+---------+-----------+----------+--------------+  PTV       Full                                                             +---------+---------------+---------+-----------+----------+--------------+  PERO      Full                                                             +---------+---------------+---------+-----------+----------+--------------+   Left Technical Findings: Not visualized segments include Limited evaluation left PTV, peroneal veins.   Summary: RIGHT: - There is no evidence of deep vein thrombosis in the lower extremity. However, portions of this examination were limited- see technologist comments above.  - No cystic structure found in the popliteal fossa.  LEFT: - There is no evidence of deep vein thrombosis in the lower extremity. However, portions of this examination were limited- see technologist comments above.  - A complex cystic structure is found in the popliteal fossa.  *See table(s) above for measurements and observations. Electronically signed by Deitra Mayo MD on 05/30/2020 at 8:09:22 PM.    Final         Scheduled Meds:  sodium chloride   Intravenous Once   sodium chloride   Intravenous Once   sodium chloride   Intravenous Once   amantadine  100 mg Oral BID   ARIPiprazole  15 mg Oral QHS   vitamin C  1,000 mg Oral Daily   cloBAZam  5 mg Oral Daily   And   cloBAZam  15 mg Oral QHS  cyanocobalamin  1,000 mcg Subcutaneous Daily   furosemide  20 mg Intravenous Q12H   lacosamide  50 mg Oral BID WC   And   lacosamide  150 mg Oral QHS   lamoTRIgine  200 mg Oral BID WC   And   lamoTRIgine  400 mg Oral QHS   levETIRAcetam  500 mg Oral BID WC   And   levETIRAcetam  1,000 mg Oral QHS   pantoprazole  40 mg Oral  Daily   pyridOXINE  600 mg Oral Daily   zinc sulfate  220 mg Oral Daily   Continuous Infusions:   LOS: 3 days    Time spent: 35 minutes    Irine Seal, MD [facility]   To contact the attending provider between 7A-7P or the covering provider during after hours 7P-7A, please log into the web site [email or web address] and access using universal [place] password for that web site. If you do not have the password, please call the hospital operator.  06/01/2020, 10:58 AM

## 2020-06-01 NOTE — Progress Notes (Signed)
Patient questions answered today to husband and patient satisfaction. Call bell within reach.

## 2020-06-01 NOTE — Procedures (Signed)
Patient Name: Andrea Crawford  MRN: [id number]  Epilepsy Attending: Lora Havens  Referring Physician/Provider: Etta Quill, PA Date: 06/01/2020 Duration: 24.31 mins  Patient history: Patient was initially brought in for cough and dry mouth.  While in the hospital noted she had increased slurred speech husband wanted to discuss with aneurologist about her hallucinations to which she is seeing a psychiatrist already.  Unfortunately patient's husband is a poor historian.  Per husband she has had slurred speech for possibly 2 years but it has worsened. EEG to evaluate for seizure.   Level of alertness: Awake, drowsy  AEDs during EEG study: LCM, LTG, clobazam  Technical aspects: This EEG study was done with scalp electrodes positioned according to the 10-20 International system of electrode placement. Electrical activity was acquired at a sampling rate of 500Hz  and reviewed with a high frequency filter of 70Hz  and a low frequency filter of 1Hz . EEG data were recorded continuously and digitally stored.   Description: The posterior dominant rhythm consists of 8 Hz activity of moderate voltage (25-35 uV) seen predominantly in posterior head regions, symmetric and reactive to eye opening and eye closing. Drowsiness was characterized by attenuation of the posterior background rhythm. There is an excessive amount of 15 to 18 Hz beta activity distributed symmetrically and diffusely. Physiologic photic driving was seen during photic stimulation.  Hyperventilation was not performed.     ABNORMALITY -Excessive beta, generalized  IMPRESSION: This study is within normal limits. The excessive beta activity seen in the background is most likely due to the effect of benzodiazepine and is a benign EEG pattern. No seizures or epileptiform discharges were seen throughout the recording.  Sachit Gilman Barbra Sarks

## 2020-06-01 NOTE — Progress Notes (Signed)
Pt pulling off telemetry,  Jumping out of bed. Swinging and yelling at staff. Pt sprayed writer with saline flush. Provider made aware.

## 2020-06-01 NOTE — Progress Notes (Signed)
EEG complete - results pending 

## 2020-06-01 NOTE — Plan of Care (Signed)

## 2020-06-01 NOTE — Progress Notes (Signed)
[place]   DOB:1955-12-03   OM#:[id number]    ASSESSMENT & PLAN:  Severe pancytopenia Work-up to date has been reviewed which shows no evidence of iron deficiency, B12 deficiency, or folate deficiency CBC from 2-1/2 months ago did not show any evidence of anemia or neutropenia No recent new medications but did have a dose increase in Abilify about 1 month ago-I do not think this is the cause of her anemia and neutropenia however Her blood count has improved but still low I discussed the risk and benefits of bone marrow biopsy with her husband and he is in agreement She does not need transfusion support today  Epilepsy Reports last seizure about 1 month ago Followed closely by neurology at [facility] on [place], Vimpat, Lamictal and Keppra Monitor closely for seizures Recommend neurology consult  Somewhat garbled speech According to her daughter, this is not new According to her husband, this is new Recent CT head showed no evidence of stroke I recommend neurology consult  Schizophrenia  The patient is followed by psychiatry as an outpatient Has been on Abilify with a recent dose increase Abilify currently on hold pending further work-up of neutropenia and anemia Her mental status is improved although her speech is somewhat garbled at times  Plan of care discussion I discussed the rationale behind bone marrow aspirate and biopsy extensively with the patient and husband and they are in agreement  Discharge planning If her blood counts are stable after blood transfusion and bone marrow biopsy, she can be discharged with follow-up with me in the outpatient next However, given her significant neurological deficit, hHer husband is not willing to take care of her at home without further assessment I spoke with the hospitalist and he will arrange for inpatient neurology consult  All questions were answered. The patient knows to call the clinic with any problems,  questions or concerns.   The total time spent in the appointment was 30 minutes encounter with patients including review of chart and various tests results, discussions about plan of care and coordination of care plan  Heath Lark, MD 06/01/2020 9:15 AM  Subjective:  She is seen this morning.  Husband by the bedside Her husband had numerous questions regarding her antiseizure medications, her slurred speech, findings of her CT head and what to do with her garbled speech and her memory  I tried my best to direct his questions and focus on pancytopenia and the plan for bone marrow aspirate and biopsy No reported bleeding She has received appropriate transfusion support since admission  Objective:  Vitals:   06/01/20 0007 06/01/20 0014  BP:    Pulse:    Resp: (!) 29 19  Temp:    SpO2:       Intake/Output Summary (Last 24 hours) at 06/01/2020 0915 Last data filed at 06/01/2020 0500 Gross per 24 hour  Intake 822.33 ml  Output 2775 ml  Net -1952.67 ml    GENERAL:alert, no distress and comfortable NEURO: alert & oriented x 3 with fluent speech, no focal motor/sensory deficits   Labs:  Recent Labs    05/29/20 2125 05/31/20 0545  NA 137 138  K 4.1 3.7  CL 102 101  CO2 25 27  GLUCOSE 115* 102*  BUN 28* 25*  CREATININE 1.20* 0.83  CALCIUM 8.3* 8.2*  GFRNONAA 47* >60  GFRAA 55* >60  PROT 6.1*  --   ALBUMIN 3.8  --   AST 23  --   ALT 37  --  ALKPHOS 92  --   BILITOT 0.6  --     Studies:  DG Chest 1 View  Result Date: 05/29/2020 CLINICAL DATA:  Dyspnea EXAM: CHEST  1 VIEW COMPARISON:  05/29/2020 FINDINGS: Small bilateral pleural effusions appears slightly increased. Enlarged cardiomediastinal silhouette with vascular congestion. Increased bibasilar airspace disease. No pneumothorax. IMPRESSION: 1. Cardiomegaly with vascular congestion. 2. Increasing bilateral pleural effusions and bibasilar atelectasis or pneumonia. Electronically Signed   By: Donavan Foil M.D.   On:  05/29/2020 22:53   CT HEAD WO CONTRAST  Result Date: 05/30/2020 CLINICAL DATA:  Neuro deficit, acute, stroke suspected. Additional history provided: Patient experiencing increasing peripheral edema with shortness of breath over the last 10 days, history of epilepsy and sleep apnea. EXAM: CT HEAD WITHOUT CONTRAST TECHNIQUE: Contiguous axial images were obtained from the base of the skull through the vertex without intravenous contrast. COMPARISON:  CTA head/neck 02/28/2019, head CT 02/28/2019, brain MRI 02/28/2019. FINDINGS: Brain: Mild generalized parenchymal atrophy. Mild patchy hypodensity within the cerebral white matter is nonspecific, but consistent with chronic small vessel ischemic disease. These findings appear stable as compared to the prior head CT of 02/28/2019. There is no acute intracranial hemorrhage. No demarcated cortical infarct is identified. No extra-axial fluid collection. No evidence of intracranial mass. No midline shift. Vascular: No hyperdense vessel.  Atherosclerotic calcifications Skull: Normal. Negative for fracture or focal lesion. Sinuses/Orbits: Visualized orbits show no acute finding. Mild ethmoid sinus mucosal thickening. No significant mastoid effusion IMPRESSION: No CT evidence of acute intracranial abnormality. Mild generalized parenchymal atrophy and chronic small vessel ischemic disease, stable as compared to the head CT of 02/28/2019. Mild ethmoid sinus mucosal thickening. Electronically Signed   By: Kellie Simmering DO   On: 05/30/2020 12:38   ECHOCARDIOGRAM COMPLETE  Result Date: 05/30/2020    ECHOCARDIOGRAM REPORT   Patient Name:   Andrea Crawford [facility] Date of Exam: 05/30/2020 Medical Rec #:  [id number]        Height:       63.0 in Accession #:    [id number]       Weight:       155.0 lb Date of Birth:  04/16/1956       BSA:          1.735 m Patient Age:    64 years         BP:           114/64 mmHg Patient Gender: F                HR:           78 bpm. Exam Location:  Inpatient  Procedure: 2D Echo Indications:    dyspnea 786.09  History:        Patient has no prior history of Echocardiogram examinations. No                 prior cardiac hx on file.  Sonographer:    Jannett Celestine RDCS (AE) Referring Phys: Platter  1. Left ventricular ejection fraction, by estimation, is 60 to 65%. The left ventricle has normal function. The left ventricle has no regional wall motion abnormalities. Left ventricular diastolic parameters were normal.  2. Right ventricular systolic function is normal. The right ventricular size is normal.  3. The mitral valve is normal in structure. Mild mitral valve regurgitation. No evidence of mitral stenosis.  4. The aortic valve is normal in structure. Aortic valve regurgitation is not visualized. No  aortic stenosis is present.  5. The inferior vena cava is dilated in size with >50% respiratory variability, suggesting right atrial pressure of 8 mmHg. Comparison(s): No prior Echocardiogram. Conclusion(s)/Recommendation(s): Flow velocities are increased across all valves consistent with high cardiac output. Consider sepsis, thyrotoxicosis, anemia, etc. FINDINGS  Left Ventricle: Left ventricular ejection fraction, by estimation, is 60 to 65%. The left ventricle has normal function. The left ventricle has no regional wall motion abnormalities. The left ventricular internal cavity size was normal in size. There is  no left ventricular hypertrophy. Left ventricular diastolic parameters were normal. Indeterminate filling pressures. Right Ventricle: The right ventricular size is normal. No increase in right ventricular wall thickness. Right ventricular systolic function is normal. Left Atrium: Left atrial size was normal in size. Right Atrium: Right atrial size was normal in size. Pericardium: Trivial pericardial effusion is present. The pericardial effusion is circumferential. Mitral Valve: The mitral valve is normal in structure. Normal mobility of  the mitral valve leaflets. Mild mitral valve regurgitation, with posteriorly-directed jet. No evidence of mitral valve stenosis. Tricuspid Valve: The tricuspid valve is normal in structure. Tricuspid valve regurgitation is not demonstrated. No evidence of tricuspid stenosis. Aortic Valve: The aortic valve is normal in structure. Aortic valve regurgitation is not visualized. No aortic stenosis is present. Aortic valve mean gradient measures 10.0 mmHg. Aortic valve peak gradient measures 18.8 mmHg. Aortic valve area, by VTI measures 2.05 cm. Pulmonic Valve: The pulmonic valve was normal in structure. Pulmonic valve regurgitation is not visualized. No evidence of pulmonic stenosis. Aorta: The aortic root is normal in size and structure. Venous: The inferior vena cava is dilated in size with greater than 50% respiratory variability, suggesting right atrial pressure of 8 mmHg. IAS/Shunts: No atrial level shunt detected by color flow Doppler.  LEFT VENTRICLE PLAX 2D LVIDd:         4.80 cm  Diastology LVIDs:         3.20 cm  LV e' lateral:   9.68 cm/s LV PW:         1.10 cm  LV E/e' lateral: 12.6 LV IVS:        1.00 cm  LV e' medial:    8.00 cm/s LVOT diam:     1.90 cm  LV E/e' medial:  15.2 LV SV:         90 LV SV Index:   52 LVOT Area:     2.84 cm  RIGHT VENTRICLE RV Basal diam:  3.83 cm RV S prime:     15.30 cm/s TAPSE (M-mode): 3.3 cm LEFT ATRIUM             Index       RIGHT ATRIUM           Index LA diam:        3.50 cm 2.02 cm/m  RA Area:     18.30 cm LA Vol (A2C):   67.3 ml 38.79 ml/m RA Volume:   50.40 ml  29.05 ml/m LA Vol (A4C):   36.2 ml 20.86 ml/m LA Biplane Vol: 53.8 ml 31.01 ml/m  AORTIC VALVE AV Area (Vmax):    2.17 cm AV Area (Vmean):   1.98 cm AV Area (VTI):     2.05 cm AV Vmax:           217.00 cm/s AV Vmean:          150.000 cm/s AV VTI:            0.439 m  AV Peak Grad:      18.8 mmHg AV Mean Grad:      10.0 mmHg LVOT Vmax:         166.00 cm/s LVOT Vmean:        105.000 cm/s LVOT VTI:           0.318 m LVOT/AV VTI ratio: 0.72  AORTA Ao Root diam: 2.90 cm MITRAL VALVE MV Area (PHT): 3.77 cm     SHUNTS MV Decel Time: 201 msec     Systemic VTI:  0.32 m MV E velocity: 122.00 cm/s  Systemic Diam: 1.90 cm MV A velocity: 88.30 cm/s MV E/A ratio:  1.38 Mihai Croitoru MD Electronically signed by Sanda Klein MD Signature Date/Time: 05/30/2020/9:08:12 AM    Final    US SPLEEN (ABDOMEN LIMITED)  Result Date: 05/30/2020 CLINICAL DATA:  64 year old female with splenomegaly. EXAM: ULTRASOUND ABDOMEN LIMITED COMPARISON:  None. FINDINGS: Targeted sonographic images of the left upper abdomen performed for assessment of the spleen. The spleen measures 11.8 x 4.9 x 11.6 cm for a volume of 350 cc. Partially visualized left pleural effusion. IMPRESSION: 1. No splenomegaly. 2. Left pleural effusion. Electronically Signed   By: Anner Crete M.D.   On: 05/30/2020 18:46   VAS [place] LOWER EXTREMITY VENOUS (DVT)  Result Date: 05/30/2020  Lower Venous DVTStudy Indications: Edema, and congestive heart failure.  Limitations: Poor ultrasound/tissue interface. Comparison Study: No prior study Performing Technologist: Maudry Mayhew MHA, RDMS, RVT, RDCS  Examination Guidelines: A complete evaluation includes B-mode imaging, spectral Doppler, color Doppler, and power Doppler as needed of all accessible portions of each vessel. Bilateral testing is considered an integral part of a complete examination. Limited examinations for reoccurring indications may be performed as noted. The reflux portion of the exam is performed with the patient in reverse Trendelenburg.  +---------+---------------+---------+-----------+----------+--------------+ RIGHT    CompressibilityPhasicitySpontaneityPropertiesThrombus Aging +---------+---------------+---------+-----------+----------+--------------+ CFV      Full           No       Yes                                  +---------+---------------+---------+-----------+----------+--------------+ SFJ      Full                                                        +---------+---------------+---------+-----------+----------+--------------+ FV Prox  Full                                                        +---------+---------------+---------+-----------+----------+--------------+ FV Mid   Full                                                        +---------+---------------+---------+-----------+----------+--------------+ FV DistalFull                                                        +---------+---------------+---------+-----------+----------+--------------+  PFV      Full                                                        +---------+---------------+---------+-----------+----------+--------------+ POP      Full           No       Yes                                 +---------+---------------+---------+-----------+----------+--------------+ PTV      Full                                                        +---------+---------------+---------+-----------+----------+--------------+ PERO     Full                                                        +---------+---------------+---------+-----------+----------+--------------+   Right Technical Findings: Not visualized segments include Limited evaluation right PTV, peroneal veins.  +---------+---------------+---------+-----------+----------+--------------+ LEFT     CompressibilityPhasicitySpontaneityPropertiesThrombus Aging +---------+---------------+---------+-----------+----------+--------------+ CFV      Full           No       Yes                                 +---------+---------------+---------+-----------+----------+--------------+ SFJ      Full                                                        +---------+---------------+---------+-----------+----------+--------------+ FV Prox  Full                                                         +---------+---------------+---------+-----------+----------+--------------+ FV Mid   Full                                                        +---------+---------------+---------+-----------+----------+--------------+ FV DistalFull                                                        +---------+---------------+---------+-----------+----------+--------------+ PFV      Full                                                        +---------+---------------+---------+-----------+----------+--------------+  POP      Full           No       Yes                                 +---------+---------------+---------+-----------+----------+--------------+ PTV      Full                                                        +---------+---------------+---------+-----------+----------+--------------+ PERO     Full                                                        +---------+---------------+---------+-----------+----------+--------------+   Left Technical Findings: Not visualized segments include Limited evaluation left PTV, peroneal veins.   Summary: RIGHT: - There is no evidence of deep vein thrombosis in the lower extremity. However, portions of this examination were limited- see technologist comments above.  - No cystic structure found in the popliteal fossa.  LEFT: - There is no evidence of deep vein thrombosis in the lower extremity. However, portions of this examination were limited- see technologist comments above.  - A complex cystic structure is found in the popliteal fossa.  *See table(s) above for measurements and observations. Electronically signed by Deitra Mayo MD on 05/30/2020 at 8:09:22 PM.    Final

## 2020-06-01 NOTE — Consult Note (Addendum)
Neurology Consultation  Reason for Consult: Altered mental status and increased slurred speech Referring Physician: Dr. Grandville Silos  CC: Increased slurred speech and altered mental status  History is obtained from: Chart and husband.  Of note husband is a poor historian.  HPI: Andrea Crawford is a 64 y.o. female with history of bitemporal epilepsy, pancytopenia, schizophrenia, medication noncompliance due to forgetting to take her meds.  Patient presented to the hospital with complaints of dyspnea and dry cough for about 2 weeks associated with leg swelling.  Neurology was asked to see patient secondary to husband complaining of increased garbled speech. As far as her garbled speech.  Initially states has been going on for many years such as 2 years however later in the discussion he states that it is more acute.  It is noted while talking she does have what definitely is dysarthric and possibly bulbar speech.  However her mouth is very dry.  Husband is a very poor historian and cannot give me a timeline.   Ten years ago the patient started seeing a psychiatrist.  She was put on Abilify due to having hallucinations..  She then went to wake [facility]. Patient started 20 mg of Abilify but then was decreased to 15 due to lethargy. She was then decreased to 10 but recently increased back to 15 mg.  Husband states recently she has been having thoughts and dreams that are not real.  Apparently she thought that her husband bought a house which he did not.  He states he had to show her the bank accounts to prove this.  She is also recently had a dream that she "talked to god" who told her to study statistics and buy a "blue walker".  Of note she is seen a psychologist for this.  She is followed by Dr. Inocente Salles at [facility] for her right temporal epilepsy.  She is on a fairly complex regimen: "1. Keppra 1000 mg tablets, 1/2 tab in Am, 1/2 tab at noon and 1 tab QHS 2. Lamictal 200 mg  tablets 1 tab po TID 3. Abilify 20 mg one tablet p.o. qhs 4. Vimpat 100 mg in the AM, 100 mg at noon and 150 mg in the PM 5. Onfi 10 mg tabs, 5 mg in the AM and 15 mg in the PM"  Neurology was consulted for both issues.   Past Medical History:  Diagnosis Date  . Seizures ([place])     Family History  Problem Relation Age of Onset  . Arthritis/Rheumatoid Mother   . Melanoma Sister   . Melanoma Brother   . Lung cancer Brother   . Breast cancer Paternal Aunt   . Seizures Neg Hx    Social History:   reports that she has never smoked. She has never used smokeless tobacco. She reports that she does not drink alcohol and does not use drugs.  Medications  Current Facility-Administered Medications:  .  0.9 %  sodium chloride infusion (Manually program via Guardrails IV Fluids), , Intravenous, Once, Guilford Shi, MD, Held at 05/30/20 1813 .  0.9 %  sodium chloride infusion (Manually program via Guardrails IV Fluids), , Intravenous, Once, Wofford, Drew A, RPH .  0.9 %  sodium chloride infusion (Manually program via Guardrails IV Fluids), , Intravenous, Once, Eugenie Filler, MD .  amantadine (SYMMETREL) capsule 100 mg, 100 mg, Oral, BID, Rise Patience, MD, 100 mg at 06/01/20 0958 .  ARIPiprazole (ABILIFY) tablet 15 mg, 15 mg, Oral,  Lindon Romp, MD .  ascorbic acid (VITAMIN C) tablet 1,000 mg, 1,000 mg, Oral, Daily, Rise Patience, MD, 1,000 mg at 06/01/20 0956 .  cloBAZam (ONFI) tablet 5 mg, 5 mg, Oral, Daily, 5 mg at 06/01/20 0957 **AND** cloBAZam (ONFI) tablet 15 mg, 15 mg, Oral, QHS, Kamineni, Neelima, MD, 15 mg at 05/31/20 2222 .  cyanocobalamin ((VITAMIN B-12)) injection 1,000 mcg, 1,000 mcg, Subcutaneous, Daily, Eugenie Filler, MD, 1,000 mcg at 06/01/20 1016 .  furosemide (LASIX) injection 20 mg, 20 mg, Intravenous, Q12H, Rise Patience, MD, 20 mg at 06/01/20 1010 .  lacosamide (VIMPAT) tablet 50 mg, 50 mg, Oral, BID WC, 50 mg at 06/01/20 0958  **AND** lacosamide (VIMPAT) tablet 150 mg, 150 mg, Oral, QHS, Rise Patience, MD, 150 mg at 05/31/20 2222 .  lamoTRIgine (LAMICTAL) tablet 200 mg, 200 mg, Oral, BID WC, 200 mg at 06/01/20 0953 **AND** lamoTRIgine (LAMICTAL) tablet 400 mg, 400 mg, Oral, QHS, Rise Patience, MD, 400 mg at 05/31/20 2223 .  levETIRAcetam (KEPPRA) tablet 500 mg, 500 mg, Oral, BID WC, 500 mg at 06/01/20 0955 **AND** levETIRAcetam (KEPPRA) tablet 1,000 mg, 1,000 mg, Oral, QHS, Rise Patience, MD, 1,000 mg at 05/31/20 2224 .  ondansetron (ZOFRAN) tablet 4 mg, 4 mg, Oral, Q6H PRN **OR** ondansetron (ZOFRAN) injection 4 mg, 4 mg, Intravenous, Q6H PRN, Rise Patience, MD .  pantoprazole (PROTONIX) EC tablet 40 mg, 40 mg, Oral, Daily, Rise Patience, MD, 40 mg at 06/01/20 0958 .  pyridOXINE (VITAMIN B-6) tablet 600 mg, 600 mg, Oral, Daily, Rise Patience, MD, 600 mg at 06/01/20 0958 .  zinc sulfate capsule 220 mg, 220 mg, Oral, Daily, Rise Patience, MD, 220 mg at 06/01/20 0955  ROS:    General ROS: negative for - chills, fatigue, fever, night sweats, weight gain or weight loss Psychological ROS: Positive for -  hallucinations, confabulations Ophthalmic ROS: negative for - blurry vision, double vision, eye pain or loss of vision ENT ROS: negative for - epistaxis, nasal discharge, oral lesions, sore throat, tinnitus or vertigo Respiratory ROS: negative for - cough, hemoptysis, shortness of breath or wheezing Cardiovascular ROS: negative for - chest pain, dyspnea on exertion, edema or irregular heartbeat Gastrointestinal ROS: negative for - abdominal pain, diarrhea, hematemesis, nausea/vomiting or stool incontinence Genito-Urinary ROS: negative for - dysuria, hematuria, incontinence or urinary frequency/urgency Musculoskeletal ROS: negative for - joint swelling or muscular weakness Neurological ROS: as noted in HPI Dermatological ROS: negative for rash and skin lesion  changes  Exam: Current vital signs: BP 110/65 (BP Location: Right Arm)   Pulse 71   Temp 97.9 F (36.6 C) (Oral)   Resp 19   Ht 5\' 3"  (1.6 m)   Wt 68.8 kg   SpO2 95%   BMI 26.87 kg/m  Vital signs in last 24 hours: Temp:  [97.9 F (36.6 C)-98.7 F (37.1 C)] 97.9 F (36.6 C) (08/04 2125) Pulse Rate:  [69-73] 71 (08/04 2125) Resp:  [16-29] 19 (08/05 0014) BP: (104-115)/(60-65) 110/65 (08/04 2125) SpO2:  [93 %-96 %] 95 % (08/04 2125)   Constitutional: Appears well-developed and well-nourished.  Eyes: No scleral injection HENT: No OP obstrucion Head: Normocephalic, EEG wires in place.  Cardiovascular: Radial pulses intact with regular rate  Respiratory: Effort normal, non-labored breathing GI: Soft.  No distension. There is no tenderness.  Skin: WDI, with significant bruising most notable on the right upper extremity Psychiatric: Does not appear to be attending to internal stimuli, affect  slightly labile but overall appropriate and cooperative  Neuro: Mental Status: Patient is awake, alert, oriented to person, place, has difficulty with month and year. (On MD evaluation she had improved to being oriented to year but not month) Speech-intact naming, repeating and comprehension.  Patient however does have dysarthric/bulbar voice.  Patient is able to follow simple commands.  Entheses on MD evaluation she was able to appropriately name days of the week backwards) Cranial Nerves: II: Visual Fields are full.  III,IV, VI: EOMI without ptosis or diploplia.  Patient does have saccadic pursuits pupils equal, round and reactive to light V: Facial sensation is symmetric to temperature VII: Facial movement is symmetric, though there appears to be some bifacial weakness and patient is unable to puff out cheeks.  VIII: hearing is intact to voice X: Palate elevates symmetrically XI: Shoulder shrug is symmetric. XII: tongue is midline without atrophy or fasciculations.  Motor: Tone is  notable for some cogwheeling and intermittent paratonia. Bulk is normal. 5/5 strength was present in all four extremities.  No drift Positive essential tremor when hands are held out straight Sensory: Sensation is symmetric to light touch and temperature in the arms and legs. Deep Tendon Reflexes: 2+ and symmetric in the biceps and patellae.  Plantars: Toes are downgoing bilaterally.  Cerebellar: FNF and HKS are intact bilaterally  Labs I have reviewed labs in epic and the results pertinent to this consultation are:   CBC    Component Value Date/Time   WBC 1.3 (LL) 06/01/2020 0950   RBC 3.14 (L) 06/01/2020 0950   HGB 10.0 (L) 06/01/2020 0950   HCT 30.5 (L) 06/01/2020 0950   PLT 159 06/01/2020 0950   MCV 97.1 06/01/2020 0950   MCH 31.8 06/01/2020 0950   MCHC 32.8 06/01/2020 0950   RDW 21.9 (H) 06/01/2020 0950   LYMPHSABS PENDING 06/01/2020 0950   MONOABS PENDING 06/01/2020 0950   EOSABS PENDING 06/01/2020 0950   BASOSABS PENDING 06/01/2020 0950    CMP     Component Value Date/Time   NA 138 06/01/2020 0950   K 4.0 06/01/2020 0950   CL 98 06/01/2020 0950   CO2 28 06/01/2020 0950   GLUCOSE 102 (H) 06/01/2020 0950   BUN 20 06/01/2020 0950   CREATININE 0.85 06/01/2020 0950   CALCIUM 8.3 (L) 06/01/2020 0950   PROT 6.1 (L) 06/01/2020 0950   ALBUMIN 3.7 06/01/2020 0950   AST 23 06/01/2020 0950   ALT 35 06/01/2020 0950   ALKPHOS 103 06/01/2020 0950   BILITOT 1.2 06/01/2020 0950   GFRNONAA >60 06/01/2020 0950   GFRAA >60 06/01/2020 0950    Imaging I have reviewed the images obtained:  CT-scan of the brain-no CT evidence of acute intracranial abnormality.  Mild generalized parenchymal atrophy and chronic small vessel ischemic disease.  Stable compared to the head CT of 02/28/2019  Etta Quill PA-C [facility] [phone]  M-F  (9:00 am- 5:00 PM)  06/01/2020, 10:37 AM     Assessment: Patient was initially brought in for cough and dry mouth.  While in  the hospital noted she had increased slurred speech husband wanted to discuss with aneurologist about her hallucinations to which she is seeing a psychiatrist already. Per husband she has had slurred speech for possibly 2 years but it has worsened; he notes that it has been waxing and waning in its quality in the past few days.  Neurological exam is notable for some bifacial weakness of unclear chronicity as well as dysarthria.  Overall  this appears to be a slowly progressive process, possibly with an overlying delirium in the setting of hospitalization, and missed doses of psychiatric medications etc. To rule out nonconvulsive status epilepticus, we will obtain an EEG, as well as an MRI to evaluate for brainstem pathology  Impression: -Hallucinations -Speech abnormality of unknown etiology at this time  Recommendations: -MRI with and without contrast --routine EEG to rule out nonconvulsive status; no evidence of seizures on this recording -Patient will need to follow-up with her psychologist and also her neurologist at [facility]. --Routine level ordered as a trough prior to 8/5 evening dose to confirm it is an appropriate therapeutic range --Appreciate pancytopenia workup per oncology -Will follow MRI findings should this study be negative for acute interveneable pathology, outpatient follow-up with her movement disorders and epilepsy specialists as appropriate  MD addendum:  I personally evaluated the patient to confirm key details of history and examination, updating the note for clarification where required.  Additionally, the EEG was completed and did not show evidence of nonconvulsive status epilepticus, which I personally reviewed.  Lesleigh Noe MD-PhD [facility] [phone]   Addended for charge capture.

## 2020-06-02 ENCOUNTER — Inpatient Hospital Stay (HOSPITAL_COMMUNITY): Payer: Medicare Other

## 2020-06-02 LAB — CBC WITH DIFFERENTIAL/PLATELET
Abs Immature Granulocytes: 0 10*3/uL (ref 0.00–0.07)
Basophils Absolute: 0 10*3/uL (ref 0.0–0.1)
Basophils Relative: 1 %
Eosinophils Absolute: 0.1 10*3/uL (ref 0.0–0.5)
Eosinophils Relative: 4 %
HCT: 28.6 % — ABNORMAL LOW (ref 36.0–46.0)
Hemoglobin: 9.3 g/dL — ABNORMAL LOW (ref 12.0–15.0)
Immature Granulocytes: 0 %
Lymphocytes Relative: 48 %
Lymphs Abs: 0.9 10*3/uL (ref 0.7–4.0)
MCH: 32.1 pg (ref 26.0–34.0)
MCHC: 32.5 g/dL (ref 30.0–36.0)
MCV: 98.6 fL (ref 80.0–100.0)
Monocytes Absolute: 0.7 10*3/uL (ref 0.1–1.0)
Monocytes Relative: 35 %
Neutro Abs: 0.2 10*3/uL — ABNORMAL LOW (ref 1.7–7.7)
Neutrophils Relative %: 12 %
Platelets: 146 10*3/uL — ABNORMAL LOW (ref 150–400)
RBC: 2.9 MIL/uL — ABNORMAL LOW (ref 3.87–5.11)
RDW: 21 % — ABNORMAL HIGH (ref 11.5–15.5)
WBC: 1.9 10*3/uL — ABNORMAL LOW (ref 4.0–10.5)
nRBC: 1 % — ABNORMAL HIGH (ref 0.0–0.2)

## 2020-06-02 LAB — BASIC METABOLIC PANEL
Anion gap: 10 (ref 5–15)
BUN: 22 mg/dL (ref 8–23)
CO2: 28 mmol/L (ref 22–32)
Calcium: 8.2 mg/dL — ABNORMAL LOW (ref 8.9–10.3)
Chloride: 99 mmol/L (ref 98–111)
Creatinine, Ser: 0.83 mg/dL (ref 0.44–1.00)
GFR calc Af Amer: >60 mL/min (ref >60)
GFR calc non Af Amer: >60 mL/min (ref >60)
Glucose, Bld: 100 mg/dL — ABNORMAL HIGH (ref 70–99)
Potassium: 3.6 mmol/L (ref 3.5–5.1)
Sodium: 137 mmol/L (ref 135–145)

## 2020-06-02 LAB — RPR: RPR Ser Ql: NONREACTIVE

## 2020-06-02 MED ORDER — GADOBUTROL 1 MMOL/ML IV SOLN
7.5000 mL | Freq: Once | INTRAVENOUS | Status: AC | PRN
  Administered 2020-06-02: 7.5 mL via INTRAVENOUS

## 2020-06-02 MED ORDER — HALOPERIDOL LACTATE 5 MG/ML IJ SOLN
5.0000 mg | Freq: Once | INTRAMUSCULAR | Status: AC
  Administered 2020-06-02: 5 mg via INTRAVENOUS
  Filled 2020-06-02: qty 1

## 2020-06-02 NOTE — Progress Notes (Signed)
PT Cancellation Note  Patient Details Name: Andrea Crawford MRN: [id number] DOB: 07-Mar-1956   Cancelled Treatment:    Reason Eval/Treat Not Completed: Fatigue/lethargy limiting ability to participate. Per RN pt was up all night, sleeping now/RN unable to arouse pt. Defer eval at this time and continue efforts   [facility] 06/02/2020, 8:55 AM

## 2020-06-02 NOTE — Progress Notes (Signed)
Andrea Crawford   DOB:[birth year]   WI#:[id number]    ASSESSMENT & PLAN:  Severepancytopenia Work-up to date has been reviewed which shows no evidence of iron deficiency, B12 deficiency, or folate deficiency CBC from 2-1/2 months ago did not show any evidence of anemia or neutropenia No recent new medications but did have a dose increase in Abilify about 1 month ago-I do not think this is the cause of her anemia and neutropenia however Her blood count has improved but still low Bone marrow aspirate and biopsy was done on June 01, 2020, results pending It would likely be next week before I have the test results She does not need further transfusion support right now If she is discharged this weekend, I can set up follow-up appointment next week  Epilepsy Reports last seizure about 1 month ago Followed closely by neurology at [facility] on [place], Vimpat, Lamictal and Keppra Monitor closely for seizures Defer to neurology  Somewhat garbled speech According to her daughter, this is not new According to her husband, this is new Recent CT head showed no evidence of stroke MRI pending  Schizophrenia  The patient is followed by psychiatry as an outpatient Noted behavioral problems overnight Defer to primary service  Discharge planning Her blood count is stable for discharge from my standpoint However, given her significant neurological deficit, Her husband is not willing to take care of her at home without further assessment If she is discharged over the weekend, I will set up follow-up appointment next week However, if she is still here next week, I will return once we have results of her bone marrow biopsy I reviewed the plan of care with her husband and he is in agreement I addressed all his questions  Heath Lark, MD 06/02/2020 9:30 AM  Subjective:  Patient is sedated.  Noted overnight events Her husband is taking notes Bone marrow biopsy was done  yesterday  Objective:  Vitals:   06/01/20 1218 06/01/20 2118  BP: 103/63 (!) 96/58  Pulse: 68 77  Resp: 14 14  Temp: 98 F (36.7 C) 98.6 F (37 C)  SpO2: 93% (!) 89%     Intake/Output Summary (Last 24 hours) at 06/02/2020 0930 Last data filed at 06/02/2020 0644 Gross per 24 hour  Intake 70 ml  Output 900 ml  Net -830 ml    GENERAL: sleeping   Labs:  Recent Labs    05/29/20 2125 05/29/20 2125 05/31/20 0545 06/01/20 0950 06/02/20 0453  NA 137   < > 138 138 137  K 4.1   < > 3.7 4.0 3.6  CL 102   < > 101 98 99  CO2 25   < > '27 28 28  '$ GLUCOSE 115*   < > 102* 102* 100*  BUN 28*   < > 25* 20 22  CREATININE 1.20*   < > 0.83 0.85 0.83  CALCIUM 8.3*   < > 8.2* 8.3* 8.2*  GFRNONAA 47*   < > >60 >60 >60  GFRAA 55*   < > >60 >60 >60  PROT 6.1*  --   --  6.1*  --   ALBUMIN 3.8  --   --  3.7  --   AST 23  --   --  23  --   ALT 37  --   --  35  --   ALKPHOS 92  --   --  103  --   BILITOT 0.6  --   --  1.2  --    < > = values in this interval not displayed.    Studies:  EEG  Result Date: 06/01/2020 Lora Havens, MD     06/01/2020  5:22 PM Patient Name: Andrea Crawford MRN: [id number] Epilepsy Attending: Lora Havens Referring Physician/Provider: Etta Quill, PA Date: 06/01/2020 Duration: 24.31 mins Patient history: Patient was initially brought in for cough and dry mouth.  While in the hospital noted she had increased slurred speech husband wanted to discuss with aneurologist about her hallucinations to which she is seeing a psychiatrist already.  Unfortunately patient's husband is a poor historian.  Per husband she has had slurred speech for possibly 2 years but it has worsened. EEG to evaluate for seizure. Level of alertness: Awake, drowsy AEDs during EEG study: LCM, LTG, clobazam Technical aspects: This EEG study was done with scalp electrodes positioned according to the 10-20 International system of electrode placement. Electrical activity was acquired at a sampling rate of  '500Hz'$  and reviewed with a high frequency filter of '70Hz'$  and a low frequency filter of '1Hz'$ . EEG data were recorded continuously and digitally stored. Description: The posterior dominant rhythm consists of 8 Hz activity of moderate voltage (25-35 uV) seen predominantly in posterior head regions, symmetric and reactive to eye opening and eye closing. Drowsiness was characterized by attenuation of the posterior background rhythm. There is an excessive amount of 15 to 18 Hz beta activity distributed symmetrically and diffusely. Physiologic photic driving was seen during photic stimulation.  Hyperventilation was not performed.   ABNORMALITY -Excessive beta, generalized IMPRESSION: This study is within normal limits. The excessive beta activity seen in the background is most likely due to the effect of benzodiazepine and is a benign EEG pattern. No seizures or epileptiform discharges were seen throughout the recording. Lora Havens   DG Chest 1 View  Result Date: 05/29/2020 CLINICAL DATA:  Dyspnea EXAM: CHEST  1 VIEW COMPARISON:  05/29/2020 FINDINGS: Small bilateral pleural effusions appears slightly increased. Enlarged cardiomediastinal silhouette with vascular congestion. Increased bibasilar airspace disease. No pneumothorax. IMPRESSION: 1. Cardiomegaly with vascular congestion. 2. Increasing bilateral pleural effusions and bibasilar atelectasis or pneumonia. Electronically Signed   By: Donavan Foil M.D.   On: 05/29/2020 22:53   CT HEAD WO CONTRAST  Result Date: 05/30/2020 CLINICAL DATA:  Neuro deficit, acute, stroke suspected. Additional history provided: Patient experiencing increasing peripheral edema with shortness of breath over the last 10 days, history of epilepsy and sleep apnea. EXAM: CT HEAD WITHOUT CONTRAST TECHNIQUE: Contiguous axial images were obtained from the base of the skull through the vertex without intravenous contrast. COMPARISON:  CTA head/neck 02/28/2019, head CT 02/28/2019, brain MRI  02/28/2019. FINDINGS: Brain: Mild generalized parenchymal atrophy. Mild patchy hypodensity within the cerebral white matter is nonspecific, but consistent with chronic small vessel ischemic disease. These findings appear stable as compared to the prior head CT of 02/28/2019. There is no acute intracranial hemorrhage. No demarcated cortical infarct is identified. No extra-axial fluid collection. No evidence of intracranial mass. No midline shift. Vascular: No hyperdense vessel.  Atherosclerotic calcifications Skull: Normal. Negative for fracture or focal lesion. Sinuses/Orbits: Visualized orbits show no acute finding. Mild ethmoid sinus mucosal thickening. No significant mastoid effusion IMPRESSION: No CT evidence of acute intracranial abnormality. Mild generalized parenchymal atrophy and chronic small vessel ischemic disease, stable as compared to the head CT of 02/28/2019. Mild ethmoid sinus mucosal thickening. Electronically Signed   By: Kellie Simmering DO   On: 05/30/2020 12:38  CT BIOPSY  Result Date: 06/01/2020 INDICATION: Pancytopenia EXAM: CT GUIDED BONE MARROW ASPIRATION AND CORE BIOPSY Interventional Radiologist:  Criselda Peaches, MD MEDICATIONS: None. ANESTHESIA/SEDATION: Moderate (conscious) sedation was employed during this procedure. A total of 1 milligrams versed and 50 micrograms fentanyl were administered intravenously. The patient's level of consciousness and vital signs were monitored continuously by radiology nursing throughout the procedure under my direct supervision. Total monitored sedation time: 10 minutes FLUOROSCOPY TIME:  None. COMPLICATIONS: None immediate. Estimated blood loss: <25 mL PROCEDURE: Informed written consent was obtained from the patient after a thorough discussion of the procedural risks, benefits and alternatives. All questions were addressed. Maximal Sterile Barrier Technique was utilized including caps, mask, sterile gowns, sterile gloves, sterile drape, hand hygiene  and skin antiseptic. A timeout was performed prior to the initiation of the procedure. The patient was positioned prone and non-contrast localization CT was performed of the pelvis to demonstrate the iliac marrow spaces. Maximal barrier sterile technique utilized including caps, mask, sterile gowns, sterile gloves, large sterile drape, hand hygiene, and betadine prep. Under sterile conditions and local anesthesia, an 11 gauge coaxial bone biopsy needle was advanced into the right iliac marrow space. Needle position was confirmed with CT imaging. Initially, bone marrow aspiration was performed. Next, the 11 gauge outer cannula was utilized to obtain a right iliac bone marrow core biopsy. Needle was removed. Hemostasis was obtained with compression. The patient tolerated the procedure well. Samples were prepared with the cytotechnologist. IMPRESSION: Technically successful CT-guided bone marrow aspiration and core biopsy of the right iliac bone. Electronically Signed   By: Jacqulynn Cadet M.D.   On: 06/01/2020 12:56   CT BONE MARROW BIOPSY & ASPIRATION  Result Date: 06/01/2020 INDICATION: Pancytopenia EXAM: CT GUIDED BONE MARROW ASPIRATION AND CORE BIOPSY Interventional Radiologist:  Criselda Peaches, MD MEDICATIONS: None. ANESTHESIA/SEDATION: Moderate (conscious) sedation was employed during this procedure. A total of 1 milligrams versed and 50 micrograms fentanyl were administered intravenously. The patient's level of consciousness and vital signs were monitored continuously by radiology nursing throughout the procedure under my direct supervision. Total monitored sedation time: 10 minutes FLUOROSCOPY TIME:  None. COMPLICATIONS: None immediate. Estimated blood loss: <25 mL PROCEDURE: Informed written consent was obtained from the patient after a thorough discussion of the procedural risks, benefits and alternatives. All questions were addressed. Maximal Sterile Barrier Technique was utilized including caps,  mask, sterile gowns, sterile gloves, sterile drape, hand hygiene and skin antiseptic. A timeout was performed prior to the initiation of the procedure. The patient was positioned prone and non-contrast localization CT was performed of the pelvis to demonstrate the iliac marrow spaces. Maximal barrier sterile technique utilized including caps, mask, sterile gowns, sterile gloves, large sterile drape, hand hygiene, and betadine prep. Under sterile conditions and local anesthesia, an 11 gauge coaxial bone biopsy needle was advanced into the right iliac marrow space. Needle position was confirmed with CT imaging. Initially, bone marrow aspiration was performed. Next, the 11 gauge outer cannula was utilized to obtain a right iliac bone marrow core biopsy. Needle was removed. Hemostasis was obtained with compression. The patient tolerated the procedure well. Samples were prepared with the cytotechnologist. IMPRESSION: Technically successful CT-guided bone marrow aspiration and core biopsy of the right iliac bone. Electronically Signed   By: Jacqulynn Cadet M.D.   On: 06/01/2020 12:56   ECHOCARDIOGRAM COMPLETE  Result Date: 05/30/2020    ECHOCARDIOGRAM REPORT   Patient Name:   Andrea Crawford Date of Exam: 05/30/2020 Medical Rec #:  [id number]        Height:       63.0 in Accession #:    [id number]       Weight:       155.0 lb Date of Birth:  [birth year]       BSA:          1.735 m Patient Age:    55 years         BP:           114/64 mmHg Patient Gender: F                HR:           78 bpm. Exam Location:  Inpatient Procedure: 2D Echo Indications:    dyspnea 786.09  History:        Patient has no prior history of Echocardiogram examinations. No                 prior cardiac hx on file.  Sonographer:    Jannett Celestine RDCS (AE) Referring Phys: [place]  1. Left ventricular ejection fraction, by estimation, is 60 to 65%. The left ventricle has normal function. The left ventricle has no  regional wall motion abnormalities. Left ventricular diastolic parameters were normal.  2. Right ventricular systolic function is normal. The right ventricular size is normal.  3. The mitral valve is normal in structure. Mild mitral valve regurgitation. No evidence of mitral stenosis.  4. The aortic valve is normal in structure. Aortic valve regurgitation is not visualized. No aortic stenosis is present.  5. The inferior vena cava is dilated in size with >50% respiratory variability, suggesting right atrial pressure of 8 mmHg. Comparison(s): No prior Echocardiogram. Conclusion(s)/Recommendation(s): Flow velocities are increased across all valves consistent with high cardiac output. Consider sepsis, thyrotoxicosis, anemia, etc. FINDINGS  Left Ventricle: Left ventricular ejection fraction, by estimation, is 60 to 65%. The left ventricle has normal function. The left ventricle has no regional wall motion abnormalities. The left ventricular internal cavity size was normal in size. There is  no left ventricular hypertrophy. Left ventricular diastolic parameters were normal. Indeterminate filling pressures. Right Ventricle: The right ventricular size is normal. No increase in right ventricular wall thickness. Right ventricular systolic function is normal. Left Atrium: Left atrial size was normal in size. Right Atrium: Right atrial size was normal in size. Pericardium: Trivial pericardial effusion is present. The pericardial effusion is circumferential. Mitral Valve: The mitral valve is normal in structure. Normal mobility of the mitral valve leaflets. Mild mitral valve regurgitation, with posteriorly-directed jet. No evidence of mitral valve stenosis. Tricuspid Valve: The tricuspid valve is normal in structure. Tricuspid valve regurgitation is not demonstrated. No evidence of tricuspid stenosis. Aortic Valve: The aortic valve is normal in structure. Aortic valve regurgitation is not visualized. No aortic stenosis is  present. Aortic valve mean gradient measures 10.0 mmHg. Aortic valve peak gradient measures 18.8 mmHg. Aortic valve area, by VTI measures 2.05 cm. Pulmonic Valve: The pulmonic valve was normal in structure. Pulmonic valve regurgitation is not visualized. No evidence of pulmonic stenosis. Aorta: The aortic root is normal in size and structure. Venous: The inferior vena cava is dilated in size with greater than 50% respiratory variability, suggesting right atrial pressure of 8 mmHg. IAS/Shunts: No atrial level shunt detected by color flow Doppler.  LEFT VENTRICLE PLAX 2D LVIDd:         4.80 cm  Diastology LVIDs:  3.20 cm  LV e' lateral:   9.68 cm/s LV PW:         1.10 cm  LV E/e' lateral: 12.6 LV IVS:        1.00 cm  LV e' medial:    8.00 cm/s LVOT diam:     1.90 cm  LV E/e' medial:  15.2 LV SV:         90 LV SV Index:   52 LVOT Area:     2.84 cm  RIGHT VENTRICLE RV Basal diam:  3.83 cm RV S prime:     15.30 cm/s TAPSE (M-mode): 3.3 cm LEFT ATRIUM             Index       RIGHT ATRIUM           Index LA diam:        3.50 cm 2.02 cm/m  RA Area:     18.30 cm LA Vol (A2C):   67.3 ml 38.79 ml/m RA Volume:   50.40 ml  29.05 ml/m LA Vol (A4C):   36.2 ml 20.86 ml/m LA Biplane Vol: 53.8 ml 31.01 ml/m  AORTIC VALVE AV Area (Vmax):    2.17 cm AV Area (Vmean):   1.98 cm AV Area (VTI):     2.05 cm AV Vmax:           217.00 cm/s AV Vmean:          150.000 cm/s AV VTI:            0.439 m AV Peak Grad:      18.8 mmHg AV Mean Grad:      10.0 mmHg LVOT Vmax:         166.00 cm/s LVOT Vmean:        105.000 cm/s LVOT VTI:          0.318 m LVOT/AV VTI ratio: 0.72  AORTA Ao Root diam: 2.90 cm MITRAL VALVE MV Area (PHT): 3.77 cm     SHUNTS MV Decel Time: 201 msec     Systemic VTI:  0.32 m MV E velocity: 122.00 cm/s  Systemic Diam: 1.90 cm MV A velocity: 88.30 cm/s MV E/A ratio:  1.38 Mihai Croitoru MD Electronically signed by Sanda Klein MD Signature Date/Time: 05/30/2020/9:08:12 AM    Final    US SPLEEN (ABDOMEN  LIMITED)  Result Date: 05/30/2020 CLINICAL DATA:  64 year old female with splenomegaly. EXAM: ULTRASOUND ABDOMEN LIMITED COMPARISON:  None. FINDINGS: Targeted sonographic images of the left upper abdomen performed for assessment of the spleen. The spleen measures 11.8 x 4.9 x 11.6 cm for a volume of 350 cc. Partially visualized left pleural effusion. IMPRESSION: 1. No splenomegaly. 2. Left pleural effusion. Electronically Signed   By: Anner Crete M.D.   On: 05/30/2020 18:46   VAS [place] LOWER EXTREMITY VENOUS (DVT)  Result Date: 05/30/2020  Lower Venous DVTStudy Indications: Edema, and congestive heart failure.  Limitations: Poor ultrasound/tissue interface. Comparison Study: No prior study Performing Technologist: Maudry Mayhew MHA, RDMS, RVT, RDCS  Examination Guidelines: A complete evaluation includes B-mode imaging, spectral Doppler, color Doppler, and power Doppler as needed of all accessible portions of each vessel. Bilateral testing is considered an integral part of a complete examination. Limited examinations for reoccurring indications may be performed as noted. The reflux portion of the exam is performed with the patient in reverse Trendelenburg.  +---------+---------------+---------+-----------+----------+--------------+ RIGHT    CompressibilityPhasicitySpontaneityPropertiesThrombus Aging +---------+---------------+---------+-----------+----------+--------------+ CFV      Full  No       Yes                                 +---------+---------------+---------+-----------+----------+--------------+ SFJ      Full                                                        +---------+---------------+---------+-----------+----------+--------------+ FV Prox  Full                                                        +---------+---------------+---------+-----------+----------+--------------+ FV Mid   Full                                                         +---------+---------------+---------+-----------+----------+--------------+ FV DistalFull                                                        +---------+---------------+---------+-----------+----------+--------------+ PFV      Full                                                        +---------+---------------+---------+-----------+----------+--------------+ POP      Full           No       Yes                                 +---------+---------------+---------+-----------+----------+--------------+ PTV      Full                                                        +---------+---------------+---------+-----------+----------+--------------+ PERO     Full                                                        +---------+---------------+---------+-----------+----------+--------------+   Right Technical Findings: Not visualized segments include Limited evaluation right PTV, peroneal veins.  +---------+---------------+---------+-----------+----------+--------------+ LEFT     CompressibilityPhasicitySpontaneityPropertiesThrombus Aging +---------+---------------+---------+-----------+----------+--------------+ CFV      Full           No       Yes                                 +---------+---------------+---------+-----------+----------+--------------+  SFJ      Full                                                        +---------+---------------+---------+-----------+----------+--------------+ FV Prox  Full                                                        +---------+---------------+---------+-----------+----------+--------------+ FV Mid   Full                                                        +---------+---------------+---------+-----------+----------+--------------+ FV DistalFull                                                        +---------+---------------+---------+-----------+----------+--------------+ PFV      Full                                                         +---------+---------------+---------+-----------+----------+--------------+ POP      Full           No       Yes                                 +---------+---------------+---------+-----------+----------+--------------+ PTV      Full                                                        +---------+---------------+---------+-----------+----------+--------------+ PERO     Full                                                        +---------+---------------+---------+-----------+----------+--------------+   Left Technical Findings: Not visualized segments include Limited evaluation left PTV, peroneal veins.   Summary: RIGHT: - There is no evidence of deep vein thrombosis in the lower extremity. However, portions of this examination were limited- see technologist comments above.  - No cystic structure found in the popliteal fossa.  LEFT: - There is no evidence of deep vein thrombosis in the lower extremity. However, portions of this examination were limited- see technologist comments above.  - A complex cystic structure is found in the popliteal fossa.  *See table(s) above for measurements and observations. Electronically signed by Deitra Mayo MD on 05/30/2020 at 8:09:22 PM.  Final

## 2020-06-02 NOTE — Progress Notes (Signed)
PROGRESS NOTE    Andrea Crawford  OHY:[id number] DOB: December 25, 1955 DOA: 05/29/2020 PCP: [name], MD   Chief Complaint  Patient presents with  . Low Hemoglobin  . Leg Swelling    Brief Narrative:  64 year old female with history of epilepsy on multiple antiepileptics (Onfi, Lamictal, Keppra, Vimpat) and schizophrenia who has been on Abilify for many years presents with complaints of dyspnea and dry cough for about 2 weeks associated with leg swellings.  Patient denies any chest pain or sputum production or dizziness or palpitations.  She reports lack of energy.  She apparently had 20 pound weight loss over the last year but started gaining weight about a month back.  She has not had any new medication changes except for Abilify dose increased from 10 mg to 15 mg (previously on 20 mg which was reduced to 10 mg and concern for confusion but again increased back to 15 mg a month back by primary psychiatrist Dr. [name] as schizophrenia was felt to be uncontrolled per daughter).  Given the symptoms patient presented to PCP and lab work revealed significant anemia with hemoglobin less than 5 and leukopenia prompting ED referral.  ED work-up revealed bilateral lower extremity edema, chest x-ray showing bilateral pleural effusion, cardiomegaly raising possibility of CHF versus pneumonia.  WBC 1.4 with neutrophils 9% and lymphocyte 50%, hemoglobin 4.7, platelets 191.  BNP 800.  EKG with NSR, QTC 491 ms, creatinine 1.2.  Patient received 2 units of PRBC transfusion overnight.  Doppler lower extremity negative for DVT.   Assessment & Plan:   Principal Problem:   Pancytopenia ([place]) Active Problems:   Anemia   Leucopenia   Peripheral edema   Epilepsy (HCC)   Sleep apnea   Schizophrenia (HCC)   Volume overload   Symptomatic anemia  1 severe anemia/neutropenia/pancytopenia ?? etiology.  Patient had presented with a neutropenia and significant anemia with hemoglobin of dropping as low as 4.5.   White count as low as 1.0.  Anemia panel with no evidence of iron deficiency, vitamin B12 levels with a low vitamin B12 however not deficient, no folate deficiency.  Patient noted to have an increase dose of Abilify approximately a month ago and currently on hold while work-up underway.  Patient status post 4 units packed red blood cells on 05/30/2020, and 2 units transfusion of packed red blood cells 05/31/2020.  Hemoglobin currently at 9.3.  WBC at 1.9.  Platelet count at 146.  Continue vitamin B12 1000 MCG's IM daily x7 days, then weekly, then monthly.  Patient status post bone marrow biopsy 06/01/2020.  Transfusion threshold hemoglobin < 7.  Hematology/oncology following and appreciate input and recommendations.  2.  Bitemporal epilepsy Currently stable.  No seizures noted during this hospitalization.  Being followed by neurology at [facility].  Has been concerned about intermittent confusion and garbled/slurred speech.  EEG obtained with no epileptiform discharges noted.  Continue home regimen of Lamictal, Keppra, Vimpat, Onfi.  Neurology consulted and are following.  Follow.  3.  Volume overload Patient noted to be volume overloaded could be likely secondary to significant anemia.  2D echo with normal EF with no wall motion abnormalities.  Patient on IV Lasix with urine output of 900 cc over the past 24 hours.  Patient is -3.6 L during this hospitalization.  Monitor closely with diuresis.  Could likely transition to oral diuretics tomorrow.  Follow.  4.  Schizophrenia Patient with recent increase of Abilify from 10 mg to 15 mg.  Abilify initially held  on presentation due to concerns with neutropenia and anemia.  Currently stable.  Discussed with hematology who feel Abilify likely not cause of patient's pancytopenia and was fine with resumption of Abilify.  Abilify resumed evening of 06/01/2020.  Will need outpatient follow-up with psychiatry/psychology.    5.  Somewhat garbled speech/intermittent  confusion. Per husband patient is garbled/slurred speech has worsened recently.  Husband also concern of some intermittent confusion that has been ongoing.  Head CT which was done negative for any acute abnormalities, mild generalized parenchymal atrophy and chronic small vessel ischemic disease stable.  Has been concerned about patient's intermittent confusion and worsening speech requesting neurology evaluation.  Ammonia level within normal limits.  RPR nonreactive.  HIV nonreactive.  TSH within normal limits.  Vitamin B12 on the low side of normal.  Continue vitamin B12 supplementation.  EEG done with no epileptiform discharges noted.  MRI head ordered however patient refused overnight and currently pending.  Neurology consulted and are following.  Abilify resumed overnight.    6.  Movement disorder Amantadine.   7.  OSA Continue CPAP nightly.  8.  Acute renal failure Likely secondary to prerenal azotemia.  Improved with diuresis and transfusion.  Follow.  DVT prophylaxis: SCDs. Code Status: Full Family Communication: Updated husband at bedside. Disposition:   Status is: Inpatient    Dispo: The patient is from: Home              Anticipated d/c is to: Home              Anticipated d/c date is: To be determined.              Patient currently with leukopenia, status post bone marrow biopsy, hemoglobin nine 9.3.  Concern for confusion.  Neurology consulted.  Work-up for confusion pending.  Not stable for discharge.       Consultants:   Hematology/oncology: Dr. Alvy Bimler 05/30/2020  IR 05/31/2020  Neurology: Dr.Bhagat 06/01/2020  Procedures:   2D echo 05/30/2020  Lower extremity Dopplers 05/30/2020  Ultrasound of the spleen 05/30/2020  Chest x-ray 05/29/2020  CT head 05/30/2020  4 units packed red blood cells 05/30/2020  2 units packed red blood cells 05/31/2020  Bone marrow biopsy 06/01/2020  EEG 06/01/2020  MRI pending  Antimicrobials:  None   Subjective: Patient asleep  comfortably.  Patient noted to be agitated overnight.  Husband at bedside.  Patient noted to be pulling off telemetry leads overnight.  Patient refused MRI overnight.    Objective: Vitals:   06/01/20 1140 06/01/20 1145 06/01/20 1218 06/01/20 2118  BP: 112/64 112/64 103/63 (!) 96/58  Pulse: 69 72 68 77  Resp: (!) _0 Temp:   98 F (36.7 C) 98.6 F (37 C)  TempSrc:   Oral Oral  SpO2: 97% 90% 93% (!) 89%  Weight:      Height:        Intake/Output Summary (Last 24 hours) at 06/02/2020 1210 Last data filed at 06/02/2020 0932 Gross per 24 hour  Intake --  Output 1200 ml  Net -1200 ml   Filed Weights   05/29/20 2059 05/31/20 0500  Weight: 70.3 kg 68.8 kg    Examination:  General exam: Sleeping peacefully. Respiratory system: CTAB anterior lung fields.  No wheezes, no crackles, no rhonchi.  Normal respiratory effort.  Cardiovascular system: Regular rate rhythm no murmurs rubs or gallops.  No JVD.  Trace-1+ bilateral lower extremity edema. Gastrointestinal system: Abdomen is soft, nontender, nondistended, positive bowel  sounds.  No rebound.  No guarding.  Central nervous system: Sleeping.  Moving extremities spontaneously.  No focal neurological deficits.  Extremities: Symmetric 5 x 5 power. Skin: No rashes, lesions or ulcers Psychiatry: Judgement and insight appear fair. Mood & affect appropriate.     Data Reviewed: I have personally reviewed following labs and imaging studies  CBC: Recent Labs  Lab 05/30/20 1400 05/30/20 1400 05/30/20 2309 05/31/20 0545 05/31/20 2130 06/01/20 0950 06/02/20 0453  WBC 1.0*  --   --  1.1* 1.3* 1.3* 1.9*  NEUTROABS 0.2*  --   --  0.2* 0.2* 0.1* 0.2*  HGB 4.5*   < > 6.8* 6.8* 9.4* 10.0* 9.3*  HCT 14.4*   < > 20.4* 20.8* 27.5* 30.5* 28.6*  MCV 106.7*  --   --  99.0 94.8 97.1 98.6  PLT 130*  --   --  138* 138* 159 146*   < > = values in this interval not displayed.    Basic Metabolic Panel: Recent Labs  Lab 05/29/20 2125  05/31/20 0545 06/01/20 0950 06/02/20 0453  NA 137 138 138 137  K 4.1 3.7 4.0 3.6  CL 102 101 98 99  CO2 _0 GLUCOSE 115* 102* 102* 100*  BUN 28* 25* 20 22  CREATININE 1.20* 0.83 0.85 0.83  CALCIUM 8.3* 8.2* 8.3* 8.2*    GFR: Estimated Creatinine Clearance: 62.9 mL/min (by C-G formula based on SCr of 0.83 mg/dL).  Liver Function Tests: Recent Labs  Lab 05/29/20 2125 06/01/20 0950  AST 23 23  ALT 37 35  ALKPHOS 92 103  BILITOT 0.6 1.2  PROT 6.1* 6.1*  ALBUMIN 3.8 3.7    CBG: No results for input(s): GLUCAP in the last 168 hours.   Recent Results (from the past 240 hour(s))  SARS Coronavirus 2 by RT PCR (hospital order, performed in [facility] hospital lab) Nasopharyngeal Nasopharyngeal Swab     Status: None   Collection Time: 05/29/20 10:24 PM   Specimen: Nasopharyngeal Swab  Result Value Ref Range Status   SARS Coronavirus 2 NEGATIVE NEGATIVE Final    Comment: (NOTE) SARS-CoV-2 target nucleic acids are NOT DETECTED.  The SARS-CoV-2 RNA is generally detectable in upper and lower respiratory specimens during the acute phase of infection. The lowest concentration of SARS-CoV-2 viral copies this assay can detect is 250 copies / mL. A negative result does not preclude SARS-CoV-2 infection and should not be used as the sole basis for treatment or other patient management decisions.  A negative result may occur with improper specimen collection / handling, submission of specimen other than nasopharyngeal swab, presence of viral mutation(s) within the areas targeted by this assay, and inadequate number of viral copies (<250 copies / mL). A negative result must be combined with clinical observations, patient history, and epidemiological information.  Fact Sheet for Patients:   StrictlyIdeas.no  Fact Sheet for Healthcare Providers: BankingDealers.co.za  This test is not yet approved or  cleared by the [place] FDA and has been authorized for detection and/or diagnosis of SARS-CoV-2 by FDA under an Emergency Use Authorization (EUA).  This EUA will remain in effect (meaning this test can be used) for the duration of the COVID-19 declaration under Section 564(b)(1) of the Act, 21 U.S.C. section 360bbb-3(b)(1), unless the authorization is terminated or revoked sooner.  Performed at [facility], [place] [address], [place], [place] [address]          Radiology Studies: EEG  Result  Date: 06/01/2020 Lora Havens, MD     06/01/2020  5:22 PM Patient Name: Andrea Crawford MRN: [id number] Epilepsy Attending: Lora Havens Referring Physician/Provider: Etta Quill, PA Date: 06/01/2020 Duration: 24.31 mins Patient history: Patient was initially brought in for cough and dry mouth.  While in the hospital noted she had increased slurred speech husband wanted to discuss with aneurologist about her hallucinations to which she is seeing a psychiatrist already.  Unfortunately patient's husband is a poor historian.  Per husband she has had slurred speech for possibly 2 years but it has worsened. EEG to evaluate for seizure. Level of alertness: Awake, drowsy AEDs during EEG study: LCM, LTG, clobazam Technical aspects: This EEG study was done with scalp electrodes positioned according to the 10-20 International system of electrode placement. Electrical activity was acquired at a sampling rate of _0  and reviewed with a high frequency filter of _1  and a low frequency filter of _2 . EEG data were recorded continuously and digitally stored. Description: The posterior dominant rhythm consists of 8 Hz activity of moderate voltage (25-35 uV) seen predominantly in posterior head regions, symmetric and reactive to eye opening and eye closing. Drowsiness was characterized by attenuation of the posterior background rhythm. There is an excessive amount of 15 to 18 Hz beta activity distributed symmetrically  and diffusely. Physiologic photic driving was seen during photic stimulation.  Hyperventilation was not performed.   ABNORMALITY -Excessive beta, generalized IMPRESSION: This study is within normal limits. The excessive beta activity seen in the background is most likely due to the effect of benzodiazepine and is a benign EEG pattern. No seizures or epileptiform discharges were seen throughout the recording. Lora Havens   CT BIOPSY  Result Date: 06/01/2020 INDICATION: Pancytopenia EXAM: CT GUIDED BONE MARROW ASPIRATION AND CORE BIOPSY Interventional Radiologist:  Criselda Peaches, MD MEDICATIONS: None. ANESTHESIA/SEDATION: Moderate (conscious) sedation was employed during this procedure. A total of 1 milligrams versed and 50 micrograms fentanyl were administered intravenously. The patient's level of consciousness and vital signs were monitored continuously by radiology nursing throughout the procedure under my direct supervision. Total monitored sedation time: 10 minutes FLUOROSCOPY TIME:  None. COMPLICATIONS: None immediate. Estimated blood loss: <25 mL PROCEDURE: Informed written consent was obtained from the patient after a thorough discussion of the procedural risks, benefits and alternatives. All questions were addressed. Maximal Sterile Barrier Technique was utilized including caps, mask, sterile gowns, sterile gloves, sterile drape, hand hygiene and skin antiseptic. A timeout was performed prior to the initiation of the procedure. The patient was positioned prone and non-contrast localization CT was performed of the pelvis to demonstrate the iliac marrow spaces. Maximal barrier sterile technique utilized including caps, mask, sterile gowns, sterile gloves, large sterile drape, hand hygiene, and betadine prep. Under sterile conditions and local anesthesia, an 11 gauge coaxial bone biopsy needle was advanced into the right iliac marrow space. Needle position was confirmed with CT imaging. Initially,  bone marrow aspiration was performed. Next, the 11 gauge outer cannula was utilized to obtain a right iliac bone marrow core biopsy. Needle was removed. Hemostasis was obtained with compression. The patient tolerated the procedure well. Samples were prepared with the cytotechnologist. IMPRESSION: Technically successful CT-guided bone marrow aspiration and core biopsy of the right iliac bone. Electronically Signed   By: Jacqulynn Cadet M.D.   On: 06/01/2020 12:56   CT BONE MARROW BIOPSY & ASPIRATION  Result Date: 06/01/2020 INDICATION: Pancytopenia EXAM: CT GUIDED BONE MARROW ASPIRATION AND CORE BIOPSY Interventional Radiologist:  Criselda Peaches, MD MEDICATIONS: None. ANESTHESIA/SEDATION: Moderate (conscious) sedation was employed during this procedure. A total of 1 milligrams versed and 50 micrograms fentanyl were administered intravenously. The patient's level of consciousness and vital signs were monitored continuously by radiology nursing throughout the procedure under my direct supervision. Total monitored sedation time: 10 minutes FLUOROSCOPY TIME:  None. COMPLICATIONS: None immediate. Estimated blood loss: <25 mL PROCEDURE: Informed written consent was obtained from the patient after a thorough discussion of the procedural risks, benefits and alternatives. All questions were addressed. Maximal Sterile Barrier Technique was utilized including caps, mask, sterile gowns, sterile gloves, sterile drape, hand hygiene and skin antiseptic. A timeout was performed prior to the initiation of the procedure. The patient was positioned prone and non-contrast localization CT was performed of the pelvis to demonstrate the iliac marrow spaces. Maximal barrier sterile technique utilized including caps, mask, sterile gowns, sterile gloves, large sterile drape, hand hygiene, and betadine prep. Under sterile conditions and local anesthesia, an 11 gauge coaxial bone biopsy needle was advanced into the right iliac marrow  space. Needle position was confirmed with CT imaging. Initially, bone marrow aspiration was performed. Next, the 11 gauge outer cannula was utilized to obtain a right iliac bone marrow core biopsy. Needle was removed. Hemostasis was obtained with compression. The patient tolerated the procedure well. Samples were prepared with the cytotechnologist. IMPRESSION: Technically successful CT-guided bone marrow aspiration and core biopsy of the right iliac bone. Electronically Signed   By: Jacqulynn Cadet M.D.   On: 06/01/2020 12:56        Scheduled Meds: . sodium chloride   Intravenous Once  . sodium chloride   Intravenous Once  . sodium chloride   Intravenous Once  . amantadine  100 mg Oral BID  . ARIPiprazole  15 mg Oral QHS  . vitamin C  1,000 mg Oral Daily  . cloBAZam  5 mg Oral Daily   And  . cloBAZam  15 mg Oral QHS  . cyanocobalamin  1,000 mcg Subcutaneous Daily  . furosemide  20 mg Intravenous Q12H  . lacosamide  50 mg Oral BID WC   And  . lacosamide  150 mg Oral QHS  . lamoTRIgine  200 mg Oral BID WC   And  . lamoTRIgine  400 mg Oral QHS  . levETIRAcetam  500 mg Oral BID WC   And  . levETIRAcetam  1,000 mg Oral QHS  . pantoprazole  40 mg Oral Daily  . pyridOXINE  600 mg Oral Daily  . zinc sulfate  220 mg Oral Daily   Continuous Infusions:   LOS: 4 days    Time spent: 35 minutes    Irine Seal, MD [facility]   To contact the attending provider between 7A-7P or the covering provider during after hours 7P-7A, please log into the web site [email or web address] and access using universal [place] password for that web site. If you do not have the password, please call the hospital operator.  06/02/2020, 12:10 PM

## 2020-06-02 NOTE — Evaluation (Signed)
Occupational Therapy Evaluation Patient Details Name: Andrea Crawford MRN: [id number] DOB: [birth year] Today's Date: 06/02/2020    History of Present Illness 64 year old female with history of epilepsy on multiple antiepileptics (Onfi, Lamictal, Keppra, Vimpat) and schizophrenia who has been on Abilify for many years presents with complaints of dyspnea and dry cough for about 2 weeks associated with leg swellings.  Patient denies any chest pain or sputum production or dizziness or palpitations.  She reports lack of energy.  She apparently had 20 pound weight loss over the last year but started gaining weight about a month back.  She has not had any new medication changes except for Abilify dose increased from 10 mg to 15 mg (previously on 20 mg which was reduced to 10 mg and concern for confusion but again increased back to 15 mg a month back by primary psychiatrist Dr. [name] as schizophrenia was felt to be uncontrolled per daughter).  Given the symptoms patient presented to PCP and lab work revealed significant anemia with hemoglobin less than 5 and leukopenia prompting ED referral.  ED work-up revealed bilateral lower extremity edema, chest x-ray showing bilateral pleural effusion, cardiomegaly raising possibility of CHF versus pneumonia.   Clinical Impression   Pt admitted with the above. Pt currently with functional limitations due to the deficits listed below (see OT Problem List).  Pt will benefit from skilled OT to increase their safety and independence with ADL and functional mobility for ADL to facilitate discharge to venue listed below.   Pts husband with many questions and hard to follow.  Explained pts needs for A at home with ADL activity - husband mentioned SNF depending on pts progress     Follow Up Recommendations  Home health OT;SNF;Supervision/Assistance - 24 hour (depending on care at home and pts progress)    Equipment Recommendations  3 in 1 bedside commode    Recommendations  for Other Services       Precautions / Restrictions Precautions Precautions: Fall      Mobility Bed Mobility Overal bed mobility: Needs Assistance Bed Mobility: Supine to Sit     Supine to sit: Min assist        Transfers Overall transfer level: Needs assistance Equipment used: 1 person hand held assist Transfers: Sit to/from [facility] Sit to Stand: Mod assist Stand pivot transfers: Mod assist            Balance Overall balance assessment: Needs assistance Sitting-balance support: Single extremity supported Sitting balance-Leahy Scale: Fair     Standing balance support: Single extremity supported Standing balance-Leahy Scale: Poor                             ADL either performed or assessed with clinical judgement   ADL Overall ADL's : Needs assistance/impaired Eating/Feeding: Set up;Sitting   Grooming: Set up;Sitting   Upper Body Bathing: Minimal assistance;Sitting   Lower Body Bathing: Maximal assistance;Sit to/from stand;Cueing for safety;Cueing for sequencing   Upper Body Dressing : Minimal assistance;Sitting   Lower Body Dressing: Maximal assistance;Sit to/from stand;Cueing for safety;Cueing for sequencing   Toilet Transfer: Moderate assistance;BSC;Cueing for sequencing;Cueing for safety;Stand-pivot   Toileting- Clothing Manipulation and Hygiene: Moderate assistance;Sit to/from stand;Cueing for safety;Cueing for sequencing         General ADL Comments: Husband does care for pt at home but bedroom upstairs- he does have concerns about stairs     Vision Patient Visual Report: No change from baseline  Extremity/Trunk Assessment Upper Extremity Assessment Upper Extremity Assessment: Generalized weakness           Communication Communication Communication: Expressive difficulties   Cognition Arousal/Alertness: Awake/alert Behavior During Therapy: WFL for tasks assessed/performed Overall Cognitive  Status: Within Functional Limits for tasks assessed                                 General Comments: pt hard to understand but followed all commands as directed   General Comments               Home Living Family/patient expects to be discharged to:: Private residence Living Arrangements: Spouse/significant other Available Help at Discharge: Family Type of Home: House       Home Layout: Two level;Bed/bath upstairs     Bathroom Shower/Tub: Occupational psychologist: Standard                Prior Functioning/Environment Level of Independence: Independent with assistive device(s)                 OT Problem List: Decreased strength;Decreased safety awareness;Decreased activity tolerance;Impaired balance (sitting and/or standing)      OT Treatment/Interventions: Self-care/ADL training;Patient/family education;Therapeutic activities;Balance training    OT Goals(Current goals can be found in the care plan section) Acute Rehab OT Goals Patient Stated Goal: did not state OT Goal Formulation: With patient Time For Goal Achievement: 06/16/20 Potential to Achieve Goals: Good  OT Frequency: Min 2X/week    AM-PAC OT "6 Clicks" Daily Activity     Outcome Measure Help from another person eating meals?: A Little Help from another person taking care of personal grooming?: A Little Help from another person toileting, which includes using toliet, bedpan, or urinal?: A Lot Help from another person bathing (including washing, rinsing, drying)?: A Lot Help from another person to put on and taking off regular upper body clothing?: A Little Help from another person to put on and taking off regular lower body clothing?: A Lot 6 Click Score: 15   End of Session Equipment Utilized During Treatment: Gait belt Nurse Communication: Mobility status  Activity Tolerance: Patient limited by fatigue Patient left: in chair;with call bell/phone within reach;with  family/visitor present  OT Visit Diagnosis: Unsteadiness on feet (R26.81);Muscle weakness (generalized) (M62.81);History of falling (Z91.81);Repeated falls (R29.6)                Time: 1635-1600 OT Time Calculation (min): 1405 min Charges:  OT General Charges $OT Visit: 1 Visit OT Evaluation $OT Eval Moderate Complexity: 1 Mod OT Treatments $Self Care/Home Management : 8-22 mins  Kari Baars, OT Acute Rehabilitation Services [phone] Office- [phone], Edwena Felty D 06/02/2020, 4:28 PM

## 2020-06-03 DIAGNOSIS — G40909 Epilepsy, unspecified, not intractable, without status epilepticus: Secondary | ICD-10-CM

## 2020-06-03 DIAGNOSIS — R609 Edema, unspecified: Secondary | ICD-10-CM

## 2020-06-03 LAB — BASIC METABOLIC PANEL
Anion gap: 10 (ref 5–15)
BUN: 19 mg/dL (ref 8–23)
CO2: 29 mmol/L (ref 22–32)
Calcium: 8.2 mg/dL — ABNORMAL LOW (ref 8.9–10.3)
Chloride: 98 mmol/L (ref 98–111)
Creatinine, Ser: 0.73 mg/dL (ref 0.44–1.00)
GFR calc Af Amer: >60 mL/min (ref >60)
GFR calc non Af Amer: >60 mL/min (ref >60)
Glucose, Bld: 98 mg/dL (ref 70–99)
Potassium: 3.6 mmol/L (ref 3.5–5.1)
Sodium: 137 mmol/L (ref 135–145)

## 2020-06-03 LAB — CBC WITH DIFFERENTIAL/PLATELET
Abs Immature Granulocytes: 0 10*3/uL (ref 0.00–0.07)
Basophils Absolute: 0 10*3/uL (ref 0.0–0.1)
Basophils Relative: 1 %
Eosinophils Absolute: 0.1 10*3/uL (ref 0.0–0.5)
Eosinophils Relative: 5 %
HCT: 27 % — ABNORMAL LOW (ref 36.0–46.0)
Hemoglobin: 8.6 g/dL — ABNORMAL LOW (ref 12.0–15.0)
Immature Granulocytes: 0 %
Lymphocytes Relative: 39 %
Lymphs Abs: 0.7 10*3/uL (ref 0.7–4.0)
MCH: 31.9 pg (ref 26.0–34.0)
MCHC: 31.9 g/dL (ref 30.0–36.0)
MCV: 100 fL (ref 80.0–100.0)
Monocytes Absolute: 0.6 10*3/uL (ref 0.1–1.0)
Monocytes Relative: 37 %
Neutro Abs: 0.3 10*3/uL — ABNORMAL LOW (ref 1.7–7.7)
Neutrophils Relative %: 18 %
Platelets: 151 10*3/uL (ref 150–400)
RBC: 2.7 MIL/uL — ABNORMAL LOW (ref 3.87–5.11)
RDW: 20.3 % — ABNORMAL HIGH (ref 11.5–15.5)
WBC: 1.7 10*3/uL — ABNORMAL LOW (ref 4.0–10.5)
nRBC: 1.2 % — ABNORMAL HIGH (ref 0.0–0.2)

## 2020-06-03 MED ORDER — POTASSIUM CHLORIDE CRYS ER 20 MEQ PO TBCR
40.0000 meq | EXTENDED_RELEASE_TABLET | Freq: Once | ORAL | Status: AC
  Administered 2020-06-03: 40 meq via ORAL
  Filled 2020-06-03: qty 2

## 2020-06-03 NOTE — Progress Notes (Signed)
Subjective: Her husband reports that her speech is better, also since restarting her Abilify he feels that she is improved.  Exam: Vitals:   06/03/20 0834 06/03/20 1305  BP:  (!) 104/55  Pulse:  66  Resp:  20  Temp: 98.6 F (37 C) 98.2 F (36.8 C)  SpO2:  99%   Gen: In bed, NAD Resp: non-labored breathing, no acute distress Abd: soft, nt  Neuro: MS: Awake, alert, oriented CN: Visual fields full, right pupil less than 1 mm larger than left(WNL), both are reactive.   Pertinent Labs: Lamotrigine level - pending B12 197  Impression: 64 yo F with AMS in the setting of multiple psych comorbidities, multiple medications for epilepsy, and acute medical illness.  The dysarthria appears to be a chronic problem that waxes and wanes.  Though there is some possibility it is medication related, I am hesitant to make any changes to her longstanding antiepileptics unless her lamotrigine level were to come back abnormally elevated.  With a negative MRI, I do not think any further testing is needed.  With borderline B12, I agree with repletion.  Recommendations: 1) continue home antiepileptics  2) no further recommendations at this time, please call if further questions or concerns.   Roland Rack, MD [facility] [phone]  If 7pm- 7am, please page neurology on call as listed in [place].

## 2020-06-03 NOTE — Evaluation (Signed)
SLP Cancellation Note  Patient Details Name: RAIGEN JAGIELSKI MRN: [id number] DOB: [birth year]   Cancelled treatment:       Reason Eval/Treat Not Completed: Other (comment) (received order for cognitive evaluation, will conduct as soon as schedule allows, thank you for this consult)  Kathleen Lime, MS [facility] SLP Acute Rehab Services Office [phone]  Macario Golds 06/03/2020, 11:07 AM

## 2020-06-03 NOTE — Progress Notes (Signed)
PROGRESS NOTE    Andrea Crawford  PRF:[id number] DOB: [birth year] DOA: 05/29/2020 PCP: [name], MD   Chief Complaint  Patient presents with  . Low Hemoglobin  . Leg Swelling    Brief Narrative:  64 year old female with history of epilepsy on multiple antiepileptics (Onfi, Lamictal, Keppra, Vimpat) and schizophrenia who has been on Abilify for many years presents with complaints of dyspnea and dry cough for about 2 weeks associated with leg swellings.  Patient denies any chest pain or sputum production or dizziness or palpitations.  She reports lack of energy.  She apparently had 20 pound weight loss over the last year but started gaining weight about a month back.  She has not had any new medication changes except for Abilify dose increased from 10 mg to 15 mg (previously on 20 mg which was reduced to 10 mg and concern for confusion but again increased back to 15 mg a month back by primary psychiatrist Dr. [name] as schizophrenia was felt to be uncontrolled per daughter).  Given the symptoms patient presented to PCP and lab work revealed significant anemia with hemoglobin less than 5 and leukopenia prompting ED referral.  ED work-up revealed bilateral lower extremity edema, chest x-ray showing bilateral pleural effusion, cardiomegaly raising possibility of CHF versus pneumonia.  WBC 1.4 with neutrophils 9% and lymphocyte 50%, hemoglobin 4.7, platelets 191.  BNP 800.  EKG with NSR, QTC 491 ms, creatinine 1.2.  Patient received 2 units of PRBC transfusion overnight.  Doppler lower extremity negative for DVT.   Assessment & Plan:   Principal Problem:   Pancytopenia ([place]) Active Problems:   Anemia   Leucopenia   Peripheral edema   Epilepsy (HCC)   Sleep apnea   Schizophrenia (HCC)   Volume overload   Symptomatic anemia  1 severe anemia/neutropenia/pancytopenia ?? etiology.  Patient had presented with a neutropenia and significant anemia with hemoglobin of dropping as low as 4.5.   White count as low as 1.0.  Anemia panel with no evidence of iron deficiency, vitamin B12 levels with a low vitamin B12 however not deficient, no folate deficiency.  Patient noted to have an increase dose of Abilify approximately a month ago and initially held during the hospitalization however has subsequently been resumed after discussion with hematology who feel is very unlikely to be the etiology of patient's pancytopenia.  Patient status post 4 units packed red blood cells on 05/30/2020, and 2 units transfusion of packed red blood cells 05/31/2020.  Hemoglobin currently at 8.6.  WBC at 1.7.  Platelet count at 151.  Continue vitamin B12 1000 MCG's IM daily x7 days, then weekly, then monthly.  Patient status post bone marrow biopsy 06/01/2020.  Transfusion threshold hemoglobin < 7.  Hematology/oncology following and appreciate input and recommendations.  2.  Bitemporal epilepsy Currently stable.  No seizures noted during this hospitalization.  Being followed by neurology at [facility].  Has been concerned about intermittent confusion and garbled/slurred speech.  EEG obtained with no epileptiform discharges noted.  Continue home regimen of Lamictal, Keppra, Vimpat, Onfi.  Neurology consulted and are following.  Follow.  3.  Volume overload Patient noted to be volume overloaded could be likely secondary to significant anemia.  2D echo with normal EF with no wall motion abnormalities.  Patient on IV Lasix with urine output of 600 cc over the past 24 hours.  Patient is - 4.938 L during this hospitalization.  Monitor closely with diuresis.  Could likely discontinue diuretics tomorrow.  Follow.  4.  Schizophrenia Patient with recent increase of Abilify from 10 mg to 15 mg.  Abilify initially held on presentation due to concerns with neutropenia and anemia.  Currently stable.  Discussed with hematology who feel Abilify likely NOT cause of patient's pancytopenia and was fine with resumption of Abilify.  Abilify  resumed evening of 06/01/2020.  Some clinical improvement in mental status per patient's husband after resumption of Abilify.  Will need outpatient follow-up with psychiatry/psychology.    5.  Somewhat garbled speech/intermittent confusion. Per husband patient is garbled/slurred speech has worsened recently.  Husband also concern of some intermittent confusion that has been ongoing.  Head CT which was done negative for any acute abnormalities, mild generalized parenchymal atrophy and chronic small vessel ischemic disease stable.  Has been concerned about patient's intermittent confusion and worsening speech requesting neurology evaluation.  Ammonia level within normal limits.  RPR nonreactive.  HIV nonreactive.  TSH within normal limits.  Vitamin B12 on the low side of normal.  Continue vitamin B12 supplementation.  EEG done with no epileptiform discharges noted.  MRI head with volume loss of the right hippocampus with surrounding hyperintense T2 weighted signal, compatible with mesial temporal sclerosis.  No acute intracranial abnormality.  Per husband improvement with patient speech overnight after being started on Abilify.  Neurology consulted and are following.   6.  Movement disorder Continue amantadine.   7.  OSA Continue CPAP nightly.  8.  Acute renal failure Likely secondary to prerenal azotemia.  Improved with diuresis and transfusion.  Follow.   DVT prophylaxis: SCDs. Code Status: Full Family Communication: Updated husband and daughter at bedside. Disposition:   Status is: Inpatient    Dispo: The patient is from: Home              Anticipated d/c is to: Home              Anticipated d/c date is: To be determined.              Patient currently with leukopenia, status post bone marrow biopsy, hemoglobin nine 8.6.  Concern for confusion which is slowly improving.  Neurology consulted.  Work-up for confusion pending.  Not stable for discharge.       Consultants:    Hematology/oncology: Dr. Alvy Bimler 05/30/2020  IR 05/31/2020  Neurology: Dr.Bhagat 06/01/2020  Procedures:   2D echo 05/30/2020  Lower extremity Dopplers 05/30/2020  Ultrasound of the spleen 05/30/2020  Chest x-ray 05/29/2020  CT head 05/30/2020  4 units packed red blood cells 05/30/2020  2 units packed red blood cells 05/31/2020  Bone marrow biopsy 06/01/2020  EEG 06/01/2020  MRI 06/02/2020  Antimicrobials:  None   Subjective: Patient alert and oriented to self place and time.  Denies any chest pain.  No shortness of breath.  No bleeding.  Feeling better today.  Less agitated overnight.  Husband and daughter at bedside.  Husband feels patient speech has improved after resumption of Abilify.  Objective: Vitals:   06/02/20 1937 06/02/20 2311 06/03/20 0356 06/03/20 0834  BP: (!) 108/54 (!) 96/52 (!) 99/52   Pulse: 73 67 79   Resp: (!) '23 20 20   ' Temp: 99 F (37.2 C) 98.8 F (37.1 C) (!) 100.5 F (38.1 C) 98.6 F (37 C)  TempSrc: Oral Oral Oral Oral  SpO2: 100% 97% 93%   Weight:   66 kg   Height:        Intake/Output Summary (Last 24 hours) at 06/03/2020 1200 Last data filed  at 06/03/2020 1017 Gross per 24 hour  Intake --  Output 1301 ml  Net -1301 ml   Filed Weights   05/29/20 2059 05/31/20 0500 06/03/20 0356  Weight: 70.3 kg 68.8 kg 66 kg    Examination:  General exam: NAD Respiratory system: Lungs clear to auscultation bilaterally.  No wheezes, no crackles, no rhonchi.  Normal respiratory effort. Cardiovascular system: RRR no murmurs rubs or gallops.  No JVD.  Trace lower extremity edema.  Gastrointestinal system: Abdomen is nontender, nondistended, soft, positive bowel sounds.  No rebound.  No guarding.   Central nervous system: Alert and oriented.  Moving extremities spontaneously.  No focal neurological deficits.  Extremities: Symmetric 5 x 5 power. Skin: No rashes, lesions or ulcers Psychiatry: Judgement and insight appear fair. Mood & affect appropriate.      Data Reviewed: I have personally reviewed following labs and imaging studies  CBC: Recent Labs  Lab 05/31/20 0545 05/31/20 2130 06/01/20 0950 06/02/20 0453 06/03/20 0525  WBC 1.1* 1.3* 1.3* 1.9* 1.7*  NEUTROABS 0.2* 0.2* 0.1* 0.2* 0.3*  HGB 6.8* 9.4* 10.0* 9.3* 8.6*  HCT 20.8* 27.5* 30.5* 28.6* 27.0*  MCV 99.0 94.8 97.1 98.6 100.0  PLT 138* 138* 159 146* 681    Basic Metabolic Panel: Recent Labs  Lab 05/29/20 2125 05/31/20 0545 06/01/20 0950 06/02/20 0453 06/03/20 0525  NA 137 138 138 137 137  K 4.1 3.7 4.0 3.6 3.6  CL 102 101 98 99 98  CO2 '25 27 28 28 29  ' GLUCOSE 115* 102* 102* 100* 98  BUN 28* 25* '20 22 19  ' CREATININE 1.20* 0.83 0.85 0.83 0.73  CALCIUM 8.3* 8.2* 8.3* 8.2* 8.2*    GFR: Estimated Creatinine Clearance: 64 mL/min (by C-G formula based on SCr of 0.73 mg/dL).  Liver Function Tests: Recent Labs  Lab 05/29/20 2125 06/01/20 0950  AST 23 23  ALT 37 35  ALKPHOS 92 103  BILITOT 0.6 1.2  PROT 6.1* 6.1*  ALBUMIN 3.8 3.7    CBG: No results for input(s): GLUCAP in the last 168 hours.   Recent Results (from the past 240 hour(s))  SARS Coronavirus 2 by RT PCR (hospital order, performed in [facility] hospital lab) Nasopharyngeal Nasopharyngeal Swab     Status: None   Collection Time: 05/29/20 10:24 PM   Specimen: Nasopharyngeal Swab  Result Value Ref Range Status   SARS Coronavirus 2 NEGATIVE NEGATIVE Final    Comment: (NOTE) SARS-CoV-2 target nucleic acids are NOT DETECTED.  The SARS-CoV-2 RNA is generally detectable in upper and lower respiratory specimens during the acute phase of infection. The lowest concentration of SARS-CoV-2 viral copies this assay can detect is 250 copies / mL. A negative result does not preclude SARS-CoV-2 infection and should not be used as the sole basis for treatment or other patient management decisions.  A negative result may occur with improper specimen collection / handling, submission of specimen  other than nasopharyngeal swab, presence of viral mutation(s) within the areas targeted by this assay, and inadequate number of viral copies (<250 copies / mL). A negative result must be combined with clinical observations, patient history, and epidemiological information.  Fact Sheet for Patients:   StrictlyIdeas.no  Fact Sheet for Healthcare Providers: BankingDealers.co.za  This test is not yet approved or  cleared by the [place] FDA and has been authorized for detection and/or diagnosis of SARS-CoV-2 by FDA under an Emergency Use Authorization (EUA).  This EUA will remain in effect (meaning this test can be  used) for the duration of the COVID-19 declaration under Section 564(b)(1) of the Act, 21 U.S.C. section 360bbb-3(b)(1), unless the authorization is terminated or revoked sooner.  Performed at [facility], [place] [address] [place], [place] [address]          Radiology Studies: EEG  Result Date: 06/01/2020 Lora Havens, MD     06/01/2020  5:22 PM Patient Name: Andrea Crawford MRN: [id number] Epilepsy Attending: Lora Havens Referring Physician/Provider: Etta Quill, PA Date: 06/01/2020 Duration: 24.31 mins Patient history: Patient was initially brought in for cough and dry mouth.  While in the hospital noted she had increased slurred speech husband wanted to discuss with aneurologist about her hallucinations to which she is seeing a psychiatrist already.  Unfortunately patient's husband is a poor historian.  Per husband she has had slurred speech for possibly 2 years but it has worsened. EEG to evaluate for seizure. Level of alertness: Awake, drowsy AEDs during EEG study: LCM, LTG, clobazam Technical aspects: This EEG study was done with scalp electrodes positioned according to the 10-20 International system of electrode placement. Electrical activity was acquired at a sampling rate of '500Hz'  and reviewed  with a high frequency filter of '70Hz'  and a low frequency filter of '1Hz' . EEG data were recorded continuously and digitally stored. Description: The posterior dominant rhythm consists of 8 Hz activity of moderate voltage (25-35 uV) seen predominantly in posterior head regions, symmetric and reactive to eye opening and eye closing. Drowsiness was characterized by attenuation of the posterior background rhythm. There is an excessive amount of 15 to 18 Hz beta activity distributed symmetrically and diffusely. Physiologic photic driving was seen during photic stimulation.  Hyperventilation was not performed.   ABNORMALITY -Excessive beta, generalized IMPRESSION: This study is within normal limits. The excessive beta activity seen in the background is most likely due to the effect of benzodiazepine and is a benign EEG pattern. No seizures or epileptiform discharges were seen throughout the recording. Lora Havens   MR BRAIN W WO CONTRAST  Result Date: 06/02/2020 CLINICAL DATA:  Epilepsy and schizophrenia. Facial weakness and dysarthria. EXAM: MRI HEAD WITHOUT AND WITH CONTRAST TECHNIQUE: Multiplanar, multiecho pulse sequences of the brain and surrounding structures were obtained without and with intravenous contrast. CONTRAST:  7.58m GADAVIST GADOBUTROL 1 MMOL/ML IV SOLN COMPARISON:  Brain MRI 02/28/2019 FINDINGS: Brain: No acute infarct, acute hemorrhage or extra-axial collection. There is an area of encephalomalacia with surrounding gliosis in the medial right temporal lobe. There is generalized atrophy without lobar predilection. No chronic microhemorrhage. Normal midline structures. Vascular: Normal flow voids. Skull and upper cervical spine: Normal marrow signal. Sinuses/Orbits: Negative. Other: None. IMPRESSION: 1. Volume loss of the right hippocampus with surrounding hyperintense T2-weighted signal, compatible with mesial temporal sclerosis. 2. No acute intracranial abnormality. Electronically Signed   By:  KUlyses JarredM.D.   On: 06/02/2020 23:06   CT BIOPSY  Result Date: 06/01/2020 INDICATION: Pancytopenia EXAM: CT GUIDED BONE MARROW ASPIRATION AND CORE BIOPSY Interventional Radiologist:  HCriselda Peaches MD MEDICATIONS: None. ANESTHESIA/SEDATION: Moderate (conscious) sedation was employed during this procedure. A total of 1 milligrams versed and 50 micrograms fentanyl were administered intravenously. The patient's level of consciousness and vital signs were monitored continuously by radiology nursing throughout the procedure under my direct supervision. Total monitored sedation time: 10 minutes FLUOROSCOPY TIME:  None. COMPLICATIONS: None immediate. Estimated blood loss: <25 mL PROCEDURE: Informed written consent was obtained from the patient after a thorough discussion of the  procedural risks, benefits and alternatives. All questions were addressed. Maximal Sterile Barrier Technique was utilized including caps, mask, sterile gowns, sterile gloves, sterile drape, hand hygiene and skin antiseptic. A timeout was performed prior to the initiation of the procedure. The patient was positioned prone and non-contrast localization CT was performed of the pelvis to demonstrate the iliac marrow spaces. Maximal barrier sterile technique utilized including caps, mask, sterile gowns, sterile gloves, large sterile drape, hand hygiene, and betadine prep. Under sterile conditions and local anesthesia, an 11 gauge coaxial bone biopsy needle was advanced into the right iliac marrow space. Needle position was confirmed with CT imaging. Initially, bone marrow aspiration was performed. Next, the 11 gauge outer cannula was utilized to obtain a right iliac bone marrow core biopsy. Needle was removed. Hemostasis was obtained with compression. The patient tolerated the procedure well. Samples were prepared with the cytotechnologist. IMPRESSION: Technically successful CT-guided bone marrow aspiration and core biopsy of the right  iliac bone. Electronically Signed   By: Jacqulynn Cadet M.D.   On: 06/01/2020 12:56   CT BONE MARROW BIOPSY & ASPIRATION  Result Date: 06/01/2020 INDICATION: Pancytopenia EXAM: CT GUIDED BONE MARROW ASPIRATION AND CORE BIOPSY Interventional Radiologist:  Criselda Peaches, MD MEDICATIONS: None. ANESTHESIA/SEDATION: Moderate (conscious) sedation was employed during this procedure. A total of 1 milligrams versed and 50 micrograms fentanyl were administered intravenously. The patient's level of consciousness and vital signs were monitored continuously by radiology nursing throughout the procedure under my direct supervision. Total monitored sedation time: 10 minutes FLUOROSCOPY TIME:  None. COMPLICATIONS: None immediate. Estimated blood loss: <25 mL PROCEDURE: Informed written consent was obtained from the patient after a thorough discussion of the procedural risks, benefits and alternatives. All questions were addressed. Maximal Sterile Barrier Technique was utilized including caps, mask, sterile gowns, sterile gloves, sterile drape, hand hygiene and skin antiseptic. A timeout was performed prior to the initiation of the procedure. The patient was positioned prone and non-contrast localization CT was performed of the pelvis to demonstrate the iliac marrow spaces. Maximal barrier sterile technique utilized including caps, mask, sterile gowns, sterile gloves, large sterile drape, hand hygiene, and betadine prep. Under sterile conditions and local anesthesia, an 11 gauge coaxial bone biopsy needle was advanced into the right iliac marrow space. Needle position was confirmed with CT imaging. Initially, bone marrow aspiration was performed. Next, the 11 gauge outer cannula was utilized to obtain a right iliac bone marrow core biopsy. Needle was removed. Hemostasis was obtained with compression. The patient tolerated the procedure well. Samples were prepared with the cytotechnologist. IMPRESSION: Technically  successful CT-guided bone marrow aspiration and core biopsy of the right iliac bone. Electronically Signed   By: Jacqulynn Cadet M.D.   On: 06/01/2020 12:56        Scheduled Meds: . amantadine  100 mg Oral BID  . ARIPiprazole  15 mg Oral QHS  . vitamin C  1,000 mg Oral Daily  . cloBAZam  5 mg Oral Daily   And  . cloBAZam  15 mg Oral QHS  . cyanocobalamin  1,000 mcg Subcutaneous Daily  . furosemide  20 mg Intravenous Q12H  . lacosamide  50 mg Oral BID WC   And  . lacosamide  150 mg Oral QHS  . lamoTRIgine  200 mg Oral BID WC   And  . lamoTRIgine  400 mg Oral QHS  . levETIRAcetam  500 mg Oral BID WC   And  . levETIRAcetam  1,000 mg Oral QHS  .  pantoprazole  40 mg Oral Daily  . pyridOXINE  600 mg Oral Daily  . zinc sulfate  220 mg Oral Daily   Continuous Infusions:   LOS: 5 days    Time spent: 35 minutes    Irine Seal, MD [facility]   To contact the attending provider between 7A-7P or the covering provider during after hours 7P-7A, please log into the web site [email or web address] and access using universal [place] password for that web site. If you do not have the password, please call the hospital operator.  06/03/2020, 12:00 PM

## 2020-06-03 NOTE — Evaluation (Signed)
Physical Therapy Evaluation Patient Details Name: Andrea Crawford MRN: [id number] DOB: Apr 16, 1956 Today's Date: 06/03/2020   History of Present Illness  64 year old female with history of epilepsy/Szs, on multiple antiepileptics. ED work-up revealed bilateral lower extremity edema, chest x-ray showing bilateral pleural effusion, cardiomegaly raising possibility of CHF versus pneumonia. also work up in progress for severe anemia/neutropenia/pancytopenia of unknown etiology  Clinical Impression  Pt admitted with above diagnosis.  Pt very agreeable to PT, supportive family present for PT. Pt with extremely unsteady gait, hx of falls at home, fatigues easily and currently requiring 3L O2 to maintain O saturations in 90s. Trial of RA at rest with sats down to 85% Recommend SNF vs home with family and 24hr assist, HHPT. Will continue to follow in acute setting.  Pt currently with functional limitations due to the deficits listed below (see PT Problem List). Pt will benefit from skilled PT to increase their independence and safety with mobility to allow discharge to the venue listed below.       Follow Up Recommendations SNF (vs HHPT/24hr assist)    Equipment Recommendations  Rolling walker with 5" wheels    Recommendations for Other Services       Precautions / Restrictions Precautions Precautions: Fall Restrictions Weight Bearing Restrictions: No      Mobility  Bed Mobility Overal bed mobility: Needs Assistance Bed Mobility: Supine to Sit     Supine to sit: Min guard;HOB elevated     General bed mobility comments: incr time, cues for task completion  Transfers Overall transfer level: Needs assistance Equipment used: None Transfers: Sit to/from Stand Sit to Stand: Min assist         General transfer comment: assist to rise and stabilize once in standing, min assist to prevent anterior LOB. pt on 3L of O2 during gait,  SpO2=90-97%. trial of RA at rest and sats decr to  85%  Ambulation/Gait Ambulation/Gait assistance: Min assist;Mod assist Gait Distance (Feet): 20 Feet Assistive device: 1 person hand held assist Gait Pattern/deviations: Step-through pattern;Decreased stride length;Festinating;Trunk flexed;Narrow base of support     General Gait Details: assist to balance and prevent anterior LOB, extremely unsteady gait  Stairs            Wheelchair Mobility    Modified Rankin (Stroke Patients Only)       Balance Overall balance assessment: History of Falls (endorses strong hx of falls)                                           Pertinent Vitals/Pain      Home Living Family/patient expects to be discharged to:: Private residence Living Arrangements: Spouse/significant other Available Help at Discharge: Family Type of Home: House       Home Layout: Two level;Bed/bath upstairs Home Equipment: None      Prior Function Level of Independence: Independent               Hand Dominance        Extremity/Trunk Assessment   Upper Extremity Assessment Upper Extremity Assessment: Defer to OT evaluation;Generalized weakness    Lower Extremity Assessment Lower Extremity Assessment: Generalized weakness    Cervical / Trunk Assessment Cervical / Trunk Assessment: Kyphotic  Communication   Communication: Expressive difficulties  Cognition  General Comments: speech difficult to understand at times, follows commands and answers questions consistently      General Comments      Exercises     Assessment/Plan    PT Assessment Patient needs continued PT services  PT Problem List Decreased strength;Decreased range of motion;Decreased activity tolerance;Decreased mobility;Pain;Decreased knowledge of use of DME;Decreased balance       PT Treatment Interventions DME instruction;Therapeutic exercise;Gait training;Functional mobility  training;Therapeutic activities;Patient/family education;Balance training    PT Goals (Current goals can be found in the Care Plan section)  Acute Rehab PT Goals Patient Stated Goal: get back to working in yard PT Goal Formulation: With patient Time For Goal Achievement: 06/17/20 Potential to Achieve Goals: Fair    Frequency Min 3X/week   Barriers to discharge        Co-evaluation               AM-PAC PT "6 Clicks" Mobility  Outcome Measure Help needed turning from your back to your side while in a flat bed without using bedrails?: A Little Help needed moving from lying on your back to sitting on the side of a flat bed without using bedrails?: A Little Help needed moving to and from a bed to a chair (including a wheelchair)?: A Little Help needed standing up from a chair using your arms (e.g., wheelchair or bedside chair)?: A Little Help needed to walk in hospital room?: A Little Help needed climbing 3-5 steps with a railing? : A Lot 6 Click Score: 17    End of Session Equipment Utilized During Treatment: Gait belt;Oxygen Activity Tolerance: Patient tolerated treatment well Patient left: in chair;with call bell/phone within reach;with chair alarm set;with family/visitor present Nurse Communication: Mobility status PT Visit Diagnosis: Other abnormalities of gait and mobility (R26.89);Unsteadiness on feet (R26.81);History of falling (Z91.81)    Time: 1206-1239 PT Time Calculation (min) (ACUTE ONLY): 33 min   Charges:   PT Evaluation $PT Eval Moderate Complexity: 1 Mod PT Treatments $Gait Training: 8-22 mins        Baxter Flattery, PT  Acute Rehab Dept ([place]) [phone] Pager [phone]  06/03/2020   [facility] 06/03/2020, 1:08 PM

## 2020-06-03 NOTE — Progress Notes (Signed)
Called report to Vill[place], RN on 6E.

## 2020-06-04 LAB — CBC WITH DIFFERENTIAL/PLATELET
Abs Immature Granulocytes: 0 10*3/uL (ref 0.00–0.07)
Basophils Absolute: 0 10*3/uL (ref 0.0–0.1)
Basophils Relative: 1 %
Eosinophils Absolute: 0.1 10*3/uL (ref 0.0–0.5)
Eosinophils Relative: 5 %
HCT: 27.4 % — ABNORMAL LOW (ref 36.0–46.0)
Hemoglobin: 8.6 g/dL — ABNORMAL LOW (ref 12.0–15.0)
Immature Granulocytes: 0 %
Lymphocytes Relative: 46 %
Lymphs Abs: 0.9 10*3/uL (ref 0.7–4.0)
MCH: 32.1 pg (ref 26.0–34.0)
MCHC: 31.4 g/dL (ref 30.0–36.0)
MCV: 102.2 fL — ABNORMAL HIGH (ref 80.0–100.0)
Monocytes Absolute: 0.7 10*3/uL (ref 0.1–1.0)
Monocytes Relative: 34 %
Neutro Abs: 0.3 10*3/uL — ABNORMAL LOW (ref 1.7–7.7)
Neutrophils Relative %: 14 %
Platelets: 155 10*3/uL (ref 150–400)
RBC: 2.68 MIL/uL — ABNORMAL LOW (ref 3.87–5.11)
RDW: 19.9 % — ABNORMAL HIGH (ref 11.5–15.5)
WBC: 1.9 10*3/uL — ABNORMAL LOW (ref 4.0–10.5)
nRBC: 1.1 % — ABNORMAL HIGH (ref 0.0–0.2)

## 2020-06-04 LAB — BASIC METABOLIC PANEL
Anion gap: 9 (ref 5–15)
BUN: 17 mg/dL (ref 8–23)
CO2: 27 mmol/L (ref 22–32)
Calcium: 8.2 mg/dL — ABNORMAL LOW (ref 8.9–10.3)
Chloride: 99 mmol/L (ref 98–111)
Creatinine, Ser: 0.71 mg/dL (ref 0.44–1.00)
GFR calc Af Amer: >60 mL/min (ref >60)
GFR calc non Af Amer: >60 mL/min (ref >60)
Glucose, Bld: 101 mg/dL — ABNORMAL HIGH (ref 70–99)
Potassium: 3.7 mmol/L (ref 3.5–5.1)
Sodium: 135 mmol/L (ref 135–145)

## 2020-06-04 LAB — MAGNESIUM: Magnesium: 1.9 mg/dL (ref 1.7–2.4)

## 2020-06-04 MED ORDER — LIP MEDEX EX OINT
TOPICAL_OINTMENT | CUTANEOUS | Status: AC
  Filled 2020-06-04: qty 7

## 2020-06-04 NOTE — TOC Initial Note (Signed)
Transition of Care [facility]) - Initial/Assessment Note    Patient Details  Name: Andrea Crawford MRN: [id number] Date of Birth: Aug 31, 1956  Transition of Care [facility]) CM/SW Contact:    Servando Snare, LCSW Phone Number: 06/04/2020, 2:30 PM  Clinical Narrative:         LCSW met with patient, spouse and daughter at bedside. Patient from home with spouse. Patient has two daughters that live locally. Family and patient had questions regarding dc plan SNF vs HH.  LCSW explained SNF vs HH and the process for each. Family are hesitant to make a decision without knowing the results of the biopsy. Patient and family to discuss options and have a decision tomorrow.        Expected Discharge Plan: Skilled Nursing Facility Barriers to Discharge: Continued Medical Work up   Patient Goals and CMS Choice     Choice offered to / list presented to : Patient, Adult Children, Spouse  Expected Discharge Plan and Services Expected Discharge Plan: [place] In-house Referral: NA Discharge Planning Services: NA Post Acute Care Choice: Home Health, [place] (Unkown at the time of assessment) Living arrangements for the past 2 months: Single Family Home                                      Prior Living Arrangements/Services Living arrangements for the past 2 months: Single Family Home Lives with:: Spouse Patient language and need for interpreter reviewed:: Yes Do you feel safe going back to the place where you live?: Yes      Need for Family Participation in Patient Care: Yes (Comment) Care giver support system in place?: Yes (comment)   Criminal Activity/Legal Involvement Pertinent to Current Situation/Hospitalization: No - Comment as needed  Activities of Daily Living Home Assistive Devices/Equipment: Eyeglasses ADL Screening (condition at time of admission) Patient's cognitive ability adequate to safely complete daily activities?: No Is the patient deaf or  have difficulty hearing?: No Does the patient have difficulty seeing, even when wearing glasses/contacts?: No Does the patient have difficulty concentrating, remembering, or making decisions?: Yes (hx schizophrenia per chart - see chart notes) Patient able to express need for assistance with ADLs?: Yes Does the patient have difficulty dressing or bathing?: No Independently performs ADLs?: Yes (appropriate for developmental age) Communication: Independent Dressing (OT): Independent Grooming: Independent Feeding: Independent Bathing: Independent Toileting: Independent In/Out Bed: Independent Walks in Home: Independent Does the patient have difficulty walking or climbing stairs?: No (has to hold the railing but does ok) Weakness of Legs: None Weakness of Arms/Hands: None  Permission Sought/Granted   Permission granted to share information with : Yes, Verbal Permission Granted  Share Information with NAME: Ofilia Neas     Permission granted to share info w Relationship: Spouse and two daughters     Emotional Assessment Appearance:: Appears stated age Attitude/Demeanor/Rapport: Engaged Affect (typically observed): Pleasant Orientation: : Oriented to Self, Oriented to Place, Oriented to  Time, Oriented to Situation Alcohol / Substance Use: Not Applicable Psych Involvement: No (comment)  Admission diagnosis:  CHF (congestive heart failure) ([place]) [I50.9] Splenomegaly, neutropenic [D73.81] Peripheral edema [R60.9] Anemia [D64.9] Symptomatic anemia [D64.9] Neutropenia, unspecified type ([place]) [D70.9] Patient Active Problem List   Diagnosis Date Noted  . Pancytopenia ([place]) 05/31/2020  . Schizophrenia ([place]) 05/31/2020  . Volume overload 05/31/2020  . Symptomatic anemia   . Leucopenia 05/30/2020  . Peripheral edema 05/30/2020  .  Epilepsy ([place]) 05/30/2020  . Sleep apnea 05/30/2020  . CHF (congestive heart failure) ([place]) 05/30/2020  . Anemia 05/29/2020   PCP:  [name],  MD Pharmacy:   [id number]: [phone]: [phone] [facility] [place] [facility] [id number]: [phone]: [phone]    Social Determinants of Health (SDOH) Interventions    Readmission Risk Interventions Readmission Risk Prevention Plan 06/04/2020  Transportation Screening Complete  Some recent data might be hidden

## 2020-06-04 NOTE — Progress Notes (Signed)
PROGRESS NOTE    Andrea Crawford  VBT:[id number] DOB: 10-16-1956 DOA: 05/29/2020 PCP: [name], MD   Chief Complaint  Patient presents with  . Low Hemoglobin  . Leg Swelling    Brief Narrative:  64 year old female with history of epilepsy on multiple antiepileptics (Onfi, Lamictal, Keppra, Vimpat) and schizophrenia who has been on Abilify for many years presents with complaints of dyspnea and dry cough for about 2 weeks associated with leg swellings.  Patient denies any chest pain or sputum production or dizziness or palpitations.  She reports lack of energy.  She apparently had 20 pound weight loss over the last year but started gaining weight about a month back.  She has not had any new medication changes except for Abilify dose increased from 10 mg to 15 mg (previously on 20 mg which was reduced to 10 mg and concern for confusion but again increased back to 15 mg a month back by primary psychiatrist Dr. [name] as schizophrenia was felt to be uncontrolled per daughter).  Given the symptoms patient presented to PCP and lab work revealed significant anemia with hemoglobin less than 5 and leukopenia prompting ED referral.  ED work-up revealed bilateral lower extremity edema, chest x-ray showing bilateral pleural effusion, cardiomegaly raising possibility of CHF versus pneumonia.  WBC 1.4 with neutrophils 9% and lymphocyte 50%, hemoglobin 4.7, platelets 191.  BNP 800.  EKG with NSR, QTC 491 ms, creatinine 1.2.  Patient received 2 units of PRBC transfusion overnight.  Doppler lower extremity negative for DVT.   Assessment & Plan:   Principal Problem:   Pancytopenia ([place]) Active Problems:   Anemia   Leucopenia   Peripheral edema   Epilepsy (HCC)   Sleep apnea   Schizophrenia (HCC)   Volume overload   Symptomatic anemia  1 severe anemia/neutropenia/pancytopenia ?? etiology.  Patient had presented with a neutropenia and significant anemia with hemoglobin of dropping as low as 4.5.   White count as low as 1.0.  Anemia panel with no evidence of iron deficiency, vitamin B12 levels with a low vitamin B12 however not deficient, no folate deficiency.  Patient noted to have an increase dose of Abilify approximately a month ago and initially held during the hospitalization however has subsequently been resumed after discussion with hematology who feel is very unlikely to be the etiology of patient's pancytopenia.  Patient status post 4 units packed red blood cells on 05/30/2020, and 2 units transfusion of packed red blood cells 05/31/2020.  Hemoglobin currently at 8.6.  WBC at 1.9.  Platelet count at 155.  Continue vitamin B12 1000 MCG's IM daily x7 days, then weekly, then monthly.  Patient status post bone marrow biopsy 06/01/2020.  Transfusion threshold hemoglobin < 7.  Hematology/oncology following and appreciate input and recommendations.  2.  Bitemporal epilepsy Currently stable.  No seizures noted during this hospitalization.  Being followed by neurology at [facility].  Has been concerned about intermittent confusion and garbled/slurred speech.  EEG obtained with no epileptiform discharges noted.  Continue home regimen of Lamictal, Keppra, Vimpat, Onfi.  Neurology consulted and have signed off.  Outpatient follow-up with neurologist.   3.  Volume overload Patient noted to be volume overloaded could be likely secondary to significant anemia.  2D echo with normal EF with no wall motion abnormalities.  Patient on IV Lasix with urine output of 2700 cc over the past 24 hours.  Patient is -7.218 L during this hospitalization.  DC IV Lasix.  Follow.  4.  Schizophrenia  Patient with recent increase of Abilify from 10 mg to 15 mg.  Abilify initially held on presentation due to concerns with neutropenia and anemia.  Currently stable.  Discussed with hematology who feel Abilify likely NOT cause of patient's pancytopenia and was fine with resumption of Abilify.  Abilify resumed evening of 06/01/2020.   Patient confusion improved clinically after resumption of Abilify.  Outpatient follow-up with psychology/psychiatrist.   5.  Somewhat garbled speech/intermittent confusion. Per husband patient is garbled/slurred speech has worsened recently.  Husband also concern of some intermittent confusion that has been ongoing.  Head CT which was done negative for any acute abnormalities, mild generalized parenchymal atrophy and chronic small vessel ischemic disease stable.  Has been concerned about patient's intermittent confusion and worsening speech requesting neurology evaluation.  Ammonia level within normal limits.  RPR nonreactive.  HIV nonreactive.  TSH within normal limits.  Vitamin B12 on the low side of normal.  Continue vitamin B12 supplementation.  EEG done with no epileptiform discharges noted.  MRI head with volume loss of the right hippocampus with surrounding hyperintense T2 weighted signal, compatible with mesial temporal sclerosis.  No acute intracranial abnormality.  Per husband improvement with patient speech overnight after being started on Abilify.  Neurology consulted and feel no further work-up needed at this time and have signed off.  Outpatient follow-up.    6.  Movement disorder Continue amantadine.   7.  OSA Continue CPAP nightly.  8.  Acute renal failure Likely secondary to prerenal azotemia.  Improved with diuresis and transfusion.  Discontinue IV Lasix.  Follow.   DVT prophylaxis: SCDs. Code Status: Full Family Communication: Updated husband and daughter at bedside. Disposition:   Status is: Inpatient    Dispo: The patient is from: Home              Anticipated d/c is to: Home              Anticipated d/c date is: Tomorrow              Patient currently with leukopenia, status post bone marrow biopsy, hemoglobin 8.6.  Concern for confusion which is slowly improving.  Neurology consulted.  Not stable for discharge.       Consultants:   Hematology/oncology: Dr.  Alvy Bimler 05/30/2020  IR 05/31/2020  Neurology: Dr.Bhagat 06/01/2020  Procedures:   2D echo 05/30/2020  Lower extremity Dopplers 05/30/2020  Ultrasound of the spleen 05/30/2020  Chest x-ray 05/29/2020  CT head 05/30/2020  4 units packed red blood cells 05/30/2020  2 units packed red blood cells 05/31/2020  Bone marrow biopsy 06/01/2020  EEG 06/01/2020  MRI 06/02/2020  Antimicrobials:  None   Subjective: Patient sitting up in bed.  Alert and oriented.  Daughter and husband at bedside.  Denies any chest pain or shortness of breath.  No bleeding.  Patient stated she slept well last night.    Objective: Vitals:   06/03/20 2109 06/03/20 2203 06/04/20 0115 06/04/20 0603  BP: (!) 103/59  105/61 113/62  Pulse: 64  62 72  Resp: '19 20 19 16  ' Temp: 97.9 F (36.6 C)  97.9 F (36.6 C) (!) 97.5 F (36.4 C)  TempSrc: Oral  Oral Oral  SpO2: 98%  99% 93%  Weight:    66.9 kg  Height:        Intake/Output Summary (Last 24 hours) at 06/04/2020 1022 Last data filed at 06/04/2020 0808 Gross per 24 hour  Intake 120 ml  Output 2100 ml  Net -  1980 ml   Filed Weights   06/03/20 0356 06/03/20 1311 06/04/20 0603  Weight: 66 kg 65.3 kg 66.9 kg    Examination:  General exam: NAD Respiratory system: CTA B.  No wheezes, no crackles, no rhonchi.  Normal respiratory effort.  Cardiovascular system: Regular rate rhythm no murmurs rubs or gallops.  No JVD.  No lower extremity edema.  Gastrointestinal system: Abdomen is soft, nontender, nondistended, positive bowel sounds.  No rebound.  No guarding.  Central nervous system: Alert and oriented.  Moving extremities spontaneously.  No focal neurological deficits.  Extremities: Symmetric 5 x 5 power. Skin: No rashes, lesions or ulcers Psychiatry: Judgement and insight appear fair. Mood & affect appropriate.     Data Reviewed: I have personally reviewed following labs and imaging studies  CBC: Recent Labs  Lab 05/31/20 2130 06/01/20 0950 06/02/20 0453  06/03/20 0525 06/04/20 0537  WBC 1.3* 1.3* 1.9* 1.7* 1.9*  NEUTROABS 0.2* 0.1* 0.2* 0.3* 0.3*  HGB 9.4* 10.0* 9.3* 8.6* 8.6*  HCT 27.5* 30.5* 28.6* 27.0* 27.4*  MCV 94.8 97.1 98.6 100.0 102.2*  PLT 138* 159 146* 151 383    Basic Metabolic Panel: Recent Labs  Lab 05/31/20 0545 06/01/20 0950 06/02/20 0453 06/03/20 0525 06/04/20 0537  NA 138 138 137 137 135  K 3.7 4.0 3.6 3.6 3.7  CL 101 98 99 98 99  CO2 '27 28 28 29 27  ' GLUCOSE 102* 102* 100* 98 101*  BUN 25* '20 22 19 17  ' CREATININE 0.83 0.85 0.83 0.73 0.71  CALCIUM 8.2* 8.3* 8.2* 8.2* 8.2*  MG  --   --   --   --  1.9    GFR: Estimated Creatinine Clearance: 64.4 mL/min (by C-G formula based on SCr of 0.71 mg/dL).  Liver Function Tests: Recent Labs  Lab 05/29/20 2125 06/01/20 0950  AST 23 23  ALT 37 35  ALKPHOS 92 103  BILITOT 0.6 1.2  PROT 6.1* 6.1*  ALBUMIN 3.8 3.7    CBG: No results for input(s): GLUCAP in the last 168 hours.   Recent Results (from the past 240 hour(s))  SARS Coronavirus 2 by RT PCR (hospital order, performed in [facility] hospital lab) Nasopharyngeal Nasopharyngeal Swab     Status: None   Collection Time: 05/29/20 10:24 PM   Specimen: Nasopharyngeal Swab  Result Value Ref Range Status   SARS Coronavirus 2 NEGATIVE NEGATIVE Final    Comment: (NOTE) SARS-CoV-2 target nucleic acids are NOT DETECTED.  The SARS-CoV-2 RNA is generally detectable in upper and lower respiratory specimens during the acute phase of infection. The lowest concentration of SARS-CoV-2 viral copies this assay can detect is 250 copies / mL. A negative result does not preclude SARS-CoV-2 infection and should not be used as the sole basis for treatment or other patient management decisions.  A negative result may occur with improper specimen collection / handling, submission of specimen other than nasopharyngeal swab, presence of viral mutation(s) within the areas targeted by this assay, and inadequate number of  viral copies (<250 copies / mL). A negative result must be combined with clinical observations, patient history, and epidemiological information.  Fact Sheet for Patients:   StrictlyIdeas.no  Fact Sheet for Healthcare Providers: BankingDealers.co.za  This test is not yet approved or  cleared by the [place] FDA and has been authorized for detection and/or diagnosis of SARS-CoV-2 by FDA under an Emergency Use Authorization (EUA).  This EUA will remain in effect (meaning this test can be used) for  the duration of the COVID-19 declaration under Section 564(b)(1) of the Act, 21 U.S.C. section 360bbb-3(b)(1), unless the authorization is terminated or revoked sooner.  Performed at [facility], [place] [address], [place], [place] [address]          Radiology Studies: MR BRAIN W WO CONTRAST  Result Date: 06/02/2020 CLINICAL DATA:  Epilepsy and schizophrenia. Facial weakness and dysarthria. EXAM: MRI HEAD WITHOUT AND WITH CONTRAST TECHNIQUE: Multiplanar, multiecho pulse sequences of the brain and surrounding structures were obtained without and with intravenous contrast. CONTRAST:  7.70m GADAVIST GADOBUTROL 1 MMOL/ML IV SOLN COMPARISON:  Brain MRI 02/28/2019 FINDINGS: Brain: No acute infarct, acute hemorrhage or extra-axial collection. There is an area of encephalomalacia with surrounding gliosis in the medial right temporal lobe. There is generalized atrophy without lobar predilection. No chronic microhemorrhage. Normal midline structures. Vascular: Normal flow voids. Skull and upper cervical spine: Normal marrow signal. Sinuses/Orbits: Negative. Other: None. IMPRESSION: 1. Volume loss of the right hippocampus with surrounding hyperintense T2-weighted signal, compatible with mesial temporal sclerosis. 2. No acute intracranial abnormality. Electronically Signed   By: KUlyses JarredM.D.   On: 06/02/2020 23:06         Scheduled Meds: . amantadine  100 mg Oral BID  . ARIPiprazole  15 mg Oral QHS  . vitamin C  1,000 mg Oral Daily  . cloBAZam  5 mg Oral Daily   And  . cloBAZam  15 mg Oral QHS  . cyanocobalamin  1,000 mcg Subcutaneous Daily  . furosemide  20 mg Intravenous Q12H  . lacosamide  50 mg Oral BID WC   And  . lacosamide  150 mg Oral QHS  . lamoTRIgine  200 mg Oral BID WC   And  . lamoTRIgine  400 mg Oral QHS  . levETIRAcetam  500 mg Oral BID WC   And  . levETIRAcetam  1,000 mg Oral QHS  . pantoprazole  40 mg Oral Daily  . pyridOXINE  600 mg Oral Daily  . zinc sulfate  220 mg Oral Daily   Continuous Infusions:   LOS: 6 days    Time spent: 35 minutes    DIrine Seal MD [facility]   To contact the attending provider between 7A-7P or the covering provider during after hours 7P-7A, please log into the web site [email or web address] and access using universal [place] password for that web site. If you do not have the password, please call the hospital operator.  06/04/2020, 10:22 AM

## 2020-06-05 ENCOUNTER — Encounter (HOSPITAL_COMMUNITY): Payer: Self-pay | Admitting: Family Medicine

## 2020-06-05 ENCOUNTER — Telehealth: Payer: Self-pay | Admitting: Hematology and Oncology

## 2020-06-05 DIAGNOSIS — G4733 Obstructive sleep apnea (adult) (pediatric): Secondary | ICD-10-CM

## 2020-06-05 DIAGNOSIS — D61818 Other pancytopenia: Secondary | ICD-10-CM

## 2020-06-05 LAB — CBC WITH DIFFERENTIAL/PLATELET
Abs Immature Granulocytes: 0 10*3/uL (ref 0.00–0.07)
Basophils Absolute: 0 10*3/uL (ref 0.0–0.1)
Basophils Relative: 1 %
Eosinophils Absolute: 0.1 10*3/uL (ref 0.0–0.5)
Eosinophils Relative: 6 %
HCT: 28.2 % — ABNORMAL LOW (ref 36.0–46.0)
Hemoglobin: 9.2 g/dL — ABNORMAL LOW (ref 12.0–15.0)
Immature Granulocytes: 0 %
Lymphocytes Relative: 36 %
Lymphs Abs: 0.7 10*3/uL (ref 0.7–4.0)
MCH: 32.6 pg (ref 26.0–34.0)
MCHC: 32.6 g/dL (ref 30.0–36.0)
MCV: 100 fL (ref 80.0–100.0)
Monocytes Absolute: 0.6 10*3/uL (ref 0.1–1.0)
Monocytes Relative: 35 %
Neutro Abs: 0.4 10*3/uL — ABNORMAL LOW (ref 1.7–7.7)
Neutrophils Relative %: 22 %
Platelets: 158 10*3/uL (ref 150–400)
RBC: 2.82 MIL/uL — ABNORMAL LOW (ref 3.87–5.11)
RDW: 19.7 % — ABNORMAL HIGH (ref 11.5–15.5)
WBC: 1.8 10*3/uL — ABNORMAL LOW (ref 4.0–10.5)
nRBC: 0 % (ref 0.0–0.2)

## 2020-06-05 LAB — BASIC METABOLIC PANEL
Anion gap: 11 (ref 5–15)
BUN: 18 mg/dL (ref 8–23)
CO2: 28 mmol/L (ref 22–32)
Calcium: 8.2 mg/dL — ABNORMAL LOW (ref 8.9–10.3)
Chloride: 91 mmol/L — ABNORMAL LOW (ref 98–111)
Creatinine, Ser: 0.69 mg/dL (ref 0.44–1.00)
GFR calc Af Amer: >60 mL/min (ref >60)
GFR calc non Af Amer: >60 mL/min (ref >60)
Glucose, Bld: 107 mg/dL — ABNORMAL HIGH (ref 70–99)
Potassium: 3.4 mmol/L — ABNORMAL LOW (ref 3.5–5.1)
Sodium: 130 mmol/L — ABNORMAL LOW (ref 135–145)

## 2020-06-05 LAB — LAMOTRIGINE LEVEL: Lamotrigine Lvl: 16.9 ug/mL (ref 2.0–20.0)

## 2020-06-05 LAB — SURGICAL PATHOLOGY

## 2020-06-05 LAB — METHYLMALONIC ACID, SERUM: Methylmalonic Acid, Quantitative: 232 nmol/L (ref 0–378)

## 2020-06-05 MED ORDER — VITAMIN B-12 1000 MCG PO TABS
1000.0000 ug | ORAL_TABLET | Freq: Every day | ORAL | Status: DC
Start: 2020-06-05 — End: 2022-01-15

## 2020-06-05 MED ORDER — POTASSIUM CHLORIDE CRYS ER 20 MEQ PO TBCR
40.0000 meq | EXTENDED_RELEASE_TABLET | Freq: Once | ORAL | Status: AC
  Administered 2020-06-05: 40 meq via ORAL
  Filled 2020-06-05: qty 2

## 2020-06-05 MED ORDER — ARIPIPRAZOLE 15 MG PO TABS: 15.0000 mg | ORAL_TABLET | Freq: Every day | ORAL | Status: DC

## 2020-06-05 NOTE — Evaluation (Signed)
SLP Cancellation Note  Patient Details Name: Andrea Crawford MRN: [id number] DOB: 08-21-1956   Cancelled treatment:       Reason Eval/Treat Not Completed: Other (comment) (pt to dc today, note she will follow up with neurology, [facility] SLP eval can be ordered if MD desires.)   Macario Golds 06/05/2020, 4:03 PM   Kathleen Lime, [facility] SLP [place] Office [phone]

## 2020-06-05 NOTE — Telephone Encounter (Signed)
Andrea Crawford has been cld and scheduled to see Dr. Alvy Bimler on 8/13 at 11:20am w/labs at 1045am. Appt date and time has been given to the pt's spouse.

## 2020-06-05 NOTE — Discharge Summary (Signed)
Physician Discharge Summary  [name] ASN:[id number] DOB: 24-Jun-1956 DOA: 05/29/2020  PCP: [name], MD  Admit date: 05/29/2020 Discharge date: 06/05/2020  Time spent: 55 minutes  Recommendations for Outpatient Follow-up:  1. Follow-up with [name], MD in 2 weeks.  On follow-up patient will need a basic metabolic profile done to follow-up on electrolytes and renal function. 2. Follow-up with Dr. Alvy Bimler, hematology/oncology in 1 week. 3. Follow-up with Dr. Yvonne Kendall, neurology in 2 weeks or as previously scheduled.   Discharge Diagnoses:  Principal Problem:   Pancytopenia ([place]) Active Problems:   Anemia   Leucopenia   Peripheral edema   Epilepsy ([facility])   Sleep apnea   Schizophrenia ([place])   Volume overload   Symptomatic anemia   Pancytopenia, acquired [facility])   Discharge Condition: Stable and improved  Diet recommendation: Regular  Filed Weights   06/03/20 0356 06/03/20 1311 06/04/20 0603  Weight: 66 kg 65.3 kg 66.9 kg    History of present illness:  HPI per Dr. [name] is a 64 y.o. female with history of epilepsy and sleep apnea was experiencing increasing peripheral edema with shortness of breath over the last 10 days.  Denied any fever chills chest pain or productive cough.  Has not had any new medications except for that patient's Abilify dose was recently increased from 10 mg to 15 mg.  Given the symptoms patient presented to her primary care physician who had done some blood work and was found to have low hemoglobin and WBC count and was referred to the ER.  ED Course: In the ER patient was not hypoxic temperature was around 99 F.  On exam patient has elevated JVD and bilateral lower extremity edema extending up to the knees.  Chest x-ray shows bilateral pleural effusion cardiomegaly with CHF versus pneumonia.  Patient does not have any productive cough or fever to suggest pneumonia.  BNP was 101.3 EKG showed normal sinus rhythm QTC of  491 ms creatinine is 1.2 which is increased from baseline of normal last year and WBC count is 1.4 with neutrophils 9% and lymphocyte 50% hemoglobin 4.7 and platelets 191.  Covid test was negative.  Patient had 2 units of PRBC transfusion ordered and admitted for further management of severe anemia with leukopenia and possible new onset CHF.  Hospital Course:  1 severe anemia/neutropenia/pancytopenia ?? etiology.  Patient had presented with a neutropenia and significant anemia with hemoglobin, dropping as low as 4.5.  White count as low as 1.0.  Anemia panel with no evidence of iron deficiency, vitamin B12 levels with a low vitamin B12 however not deficient, no folate deficiency.  Patient noted to have an increase dose of Abilify approximately a month ago and initially held during the hospitalization however has subsequently been resumed after discussion with hematology who feel is very unlikely to be the etiology of patient's pancytopenia.  Patient status post 4 units packed red blood cells on 05/30/2020, and 2 units transfusion of packed red blood cells 05/31/2020.  Hemoglobin stabilized and was 9.2 by day of discharge.  WBC was 1.8.  Platelet count of 158 on day of discharge.  Patient was placed on vitamin B12 supplementation 1000 MCG's IM daily during the hospitalization and will be transition to oral vitamin B12 supplementation on discharge.  Patient status post bone marrow biopsy 06/01/2020 with results pending by day of discharge.  Patient was seen and followed by hematology/oncology throughout the hospitalization and will follow up in the outpatient setting  for further evaluation, management and results from bone marrow biopsy.  2.  Bitemporal epilepsy Remained stable throughout the hospitalization.  No seizures noted during this hospitalization.  Being [facility] by [facility]y at [facility].  Husband concerned about intermittent confusion and garbled/slurred speech.  EEG obtained with no epileptiform  discharges noted.    Patient maintained on home regimen of Lamictal, Keppra, Vimpat, Onfi.  Neurology consulted and have signed off.  Outpatient follow-up with neurologist.   3.  Volume overload Patient noted to be volume overloaded could be likely secondary to significant anemia.  2D echo with normal EF with no wall motion abnormalities.  Patient was placed on IV Lasix during the hospitalization with good diuresis.  Patient was -8.998 L during this hospitalization.  Patient improved clinically.  Volume overload had resolved by day of discharge.  Diuretics were discontinued.  Outpatient follow-up.   4.  Schizophrenia Patient with recent increase of Abilify from 10 mg to 15 mg.  Abilify initially held on presentation due to concerns with neutropenia and anemia. Discussed with hematology who feel Abilify likely NOT cause of patient's pancytopenia and was fine with resumption of Abilify.  Abilify resumed evening of 06/01/2020.  Patient confusion improved clinically after resumption of Abilify.  Outpatient follow-up with psychology/psychiatrist.   5.  Somewhat garbled speech/intermittent confusion. Per husband patient's garbled/slurred speech had worsened recently.  Husband also concerned of some intermittent confusion that has been ongoing.  Head CT which was done negative for any acute abnormalities, mild generalized parenchymal atrophy and chronic small vessel ischemic disease stable.  Husband concerned about patient's intermittent confusion and worsening speech requesting neurology evaluation.  Ammonia level within normal limits.  RPR nonreactive.  HIV nonreactive.  TSH within normal limits.  Vitamin B12 on the low side of normal.    Patient started on vitamin B12 supplementation.  EEG done with no epileptiform discharges noted.  MRI head with volume loss of the right hippocampus with surrounding hyperintense T2 weighted signal, compatible with mesial temporal sclerosis.  No acute intracranial abnormality.   Per husband improvement with patient speech after being started on Abilify.  Neurology consulted and feel no further work-up needed at this time and have signed off.  Outpatient follow-up with neurologist.  Patient was discharged in stable and improved condition.  6.  Movement disorder Patient maintained on home regimen amantadine.   7.  OSA Patient was maintained on CPAP nightly during the hospitalization.  Outpatient follow-up.    8.  Acute renal failure Likely secondary to prerenal azotemia.  Improved with diuresis and transfusion.  Diuretics were discontinued.  Outpatient follow-up.    Procedures:  2D echo 05/30/2020  Lower extremity Dopplers 05/30/2020  Ultrasound of the spleen 05/30/2020  Chest x-ray 05/29/2020  CT head 05/30/2020  4 units packed red blood cells 05/30/2020  2 units packed red blood cells 05/31/2020  Bone marrow biopsy 06/01/2020  EEG 06/01/2020  MRI 06/02/2020  Consultations:  Hematology/oncology: Dr. Alvy Bimler 05/30/2020  IR 05/31/2020  Neurology: Dr.Bhagat 06/01/2020   Discharge Exam: Vitals:   06/04/20 2007 06/05/20 0545  BP: 126/68 (!) 100/51  Pulse: 80 75  Resp: 20 18  Temp: 98.3 F (36.8 C) 98.1 F (36.7 C)  SpO2: 93% 91%    General: NAD Cardiovascular: RRR Respiratory: CTAB  Discharge Instructions   Discharge Instructions    Diet general   Complete by: As directed    Increase activity slowly   Complete by: As directed    No wound care  Complete by: As directed      Allergies as of 06/05/2020   No Known Allergies     Medication List    STOP taking these medications   meloxicam 15 MG tablet Commonly known as: MOBIC     TAKE these medications   acetaminophen 500 MG tablet Commonly known as: TYLENOL Take 1,000 mg by mouth every 6 (six) hours as needed for headache (pain).   amantadine 100 MG capsule Commonly known as: SYMMETREL Take 100 mg by mouth 2 (two) times daily.   ARIPiprazole 15 MG tablet Commonly known as:  ABILIFY Take 1 tablet (15 mg total) by mouth at bedtime. What changed:   medication strength  how much to take   cholecalciferol 25 MCG (1000 UNIT) tablet Commonly known as: VITAMIN D3 Take 1,000 Units by mouth daily as needed (sunshine replacement).   lacosamide 50 MG Tabs tablet Commonly known as: VIMPAT Take 50-150 mg by mouth See admin instructions. Take 1 tablet (50 mg) by mouth with breakfast and lunch, take 3 tablets (150 mg) at bedtime   lamoTRIgine 200 MG tablet Commonly known as: LAMICTAL Take 200-400 mg by mouth See admin instructions. Take one tablet (200 mg) by mouth with breakfast and lunch, take two tablets (400 mg) at bedtime   levETIRAcetam 1000 MG tablet Commonly known as: KEPPRA Take 500-1,000 mg by mouth See admin instructions. Take 1/2 tablet (500 mg) by mouth with breakfast and lunch, take 1 tablet (1000 mg) at bedtime   omeprazole 40 MG capsule Commonly known as: PRILOSEC Take 40 mg by mouth at bedtime.   Onfi 10 MG tablet Generic drug: cloBAZam Take 5-15 mg by mouth See admin instructions. 0.5 tablet (5 MG) in the morning and 1.5 tablets (15 MG) in the evening   PRESCRIPTION MEDICATION Inhale into the lungs at bedtime. CPAP   pyridOXINE 100 MG tablet Commonly known as: VITAMIN B-6 Take 600 mg by mouth daily.   vitamin B-12 1000 MCG tablet Commonly known as: CYANOCOBALAMIN Take 1 tablet (1,000 mcg total) by mouth daily.   vitamin C 1000 MG tablet Take 1,000 mg by mouth 2 (two) times a day.   zinc gluconate 50 MG tablet Take 200 mg by mouth in the morning, at noon, in the evening, and at bedtime.            Durable Medical Equipment  (From admission, onward)         Start     Ordered   06/03/20 1742  For home use only DME Walker rolling  Once       Question Answer Comment  Walker: With 5 Inch Wheels   Patient needs a walker to treat with the following condition Debility      06/03/20 1742   06/02/20 1748  For home use only DME 3  n 1  Once        06/02/20 1747         No Known Allergies  Follow-up Information    Heath Lark, MD. Schedule an appointment as soon as possible for a visit in 1 week(s).   Specialty: Hematology and Oncology Why: f/u as scheduled. Contact information: [place] [address] [phone]        [name], MD. Schedule an appointment as soon as possible for a visit in 2 week(s).   Specialty: Family Medicine Contact information: [place] [address] [phone]        Merlene Morse, MD.  Schedule an appointment as soon as possible for a visit in 2 week(s).   Specialty: Psychiatry Why: Follow-up in 2 weeks as previously scheduled. Contact information: [place] [address] [phone]                The results of significant diagnostics from this hospitalization (including imaging, microbiology, ancillary and laboratory) are listed below for reference.    Significant Diagnostic Studies: EEG  Result Date: 06/01/2020 Lora Havens, MD     06/01/2020  5:22 PM Patient Name: Andrea Crawford MRN: [id number] Epilepsy Attending: Lora Havens Referring Physician/Provider: Etta Quill, PA Date: 06/01/2020 Duration: 24.31 mins Patient history: Patient was initially brought in for cough and dry mouth.  While in the hospital noted she had increased slurred speech husband wanted to discuss with aneurologist about her hallucinations to which she is seeing a psychiatrist already.  Unfortunately patient's husband is a poor historian.  Per husband she has had slurred speech for possibly 2 years but it has worsened. EEG to evaluate for seizure. Level of alertness: Awake, drowsy AEDs during EEG study: LCM, LTG, clobazam Technical aspects: This EEG study was done with scalp electrodes positioned according to the 10-20 International system of electrode placement. Electrical activity was acquired at a sampling rate  of '500Hz'  and reviewed with a high frequency filter of '70Hz'  and a low frequency filter of '1Hz' . EEG data were recorded continuously and digitally stored. Description: The posterior dominant rhythm consists of 8 Hz activity of moderate voltage (25-35 uV) seen predominantly in posterior head regions, symmetric and reactive to eye opening and eye closing. Drowsiness was characterized by attenuation of the posterior background rhythm. There is an excessive amount of 15 to 18 Hz beta activity distributed symmetrically and diffusely. Physiologic photic driving was seen during photic stimulation.  Hyperventilation was not performed.   ABNORMALITY -Excessive beta, generalized IMPRESSION: This study is within normal limits. The excessive beta activity seen in the background is most likely due to the effect of benzodiazepine and is a benign EEG pattern. No seizures or epileptiform discharges were seen throughout the recording. Lora Havens   DG Chest 1 View  Result Date: 05/29/2020 CLINICAL DATA:  Dyspnea EXAM: CHEST  1 VIEW COMPARISON:  05/29/2020 FINDINGS: Small bilateral pleural effusions appears slightly increased. Enlarged cardiomediastinal silhouette with vascular congestion. Increased bibasilar airspace disease. No pneumothorax. IMPRESSION: 1. Cardiomegaly with vascular congestion. 2. Increasing bilateral pleural effusions and bibasilar atelectasis or pneumonia. Electronically Signed   By: Donavan Foil M.D.   On: 05/29/2020 22:53   CT HEAD WO CONTRAST  Result Date: 05/30/2020 CLINICAL DATA:  Neuro deficit, acute, stroke suspected. Additional history provided: Patient experiencing increasing peripheral edema with shortness of breath over the last 10 days, history of epilepsy and sleep apnea. EXAM: CT HEAD WITHOUT CONTRAST TECHNIQUE: Contiguous axial images were obtained from the base of the skull through the vertex without intravenous contrast. COMPARISON:  CTA head/neck 02/28/2019, head CT 02/28/2019, brain  MRI 02/28/2019. FINDINGS: Brain: Mild generalized parenchymal atrophy. Mild patchy hypodensity within the cerebral white matter is nonspecific, but consistent with chronic small vessel ischemic disease. These findings appear stable as compared to the prior head CT of 02/28/2019. There is no acute intracranial hemorrhage. No demarcated cortical infarct is identified. No extra-axial fluid collection. No evidence of intracranial mass. No midline shift. Vascular: No hyperdense vessel.  Atherosclerotic calcifications Skull: Normal. Negative for fracture or focal lesion. Sinuses/Orbits: Visualized orbits show no acute finding. Mild  ethmoid sinus mucosal thickening. No significant mastoid effusion IMPRESSION: No CT evidence of acute intracranial abnormality. Mild generalized parenchymal atrophy and chronic small vessel ischemic disease, stable as compared to the head CT of 02/28/2019. Mild ethmoid sinus mucosal thickening. Electronically Signed   By: Kellie Simmering DO   On: 05/30/2020 12:38   MR BRAIN W WO CONTRAST  Result Date: 06/02/2020 CLINICAL DATA:  Epilepsy and schizophrenia. Facial weakness and dysarthria. EXAM: MRI HEAD WITHOUT AND WITH CONTRAST TECHNIQUE: Multiplanar, multiecho pulse sequences of the brain and surrounding structures were obtained without and with intravenous contrast. CONTRAST:  7.45m GADAVIST GADOBUTROL 1 MMOL/ML IV SOLN COMPARISON:  Brain MRI 02/28/2019 FINDINGS: Brain: No acute infarct, acute hemorrhage or extra-axial collection. There is an area of encephalomalacia with surrounding gliosis in the medial right temporal lobe. There is generalized atrophy without lobar predilection. No chronic microhemorrhage. Normal midline structures. Vascular: Normal flow voids. Skull and upper cervical spine: Normal marrow signal. Sinuses/Orbits: Negative. Other: None. IMPRESSION: 1. Volume loss of the right hippocampus with surrounding hyperintense T2-weighted signal, compatible with mesial temporal  sclerosis. 2. No acute intracranial abnormality. Electronically Signed   By: KUlyses JarredM.D.   On: 06/02/2020 23:06   CT BIOPSY  Result Date: 06/01/2020 INDICATION: Pancytopenia EXAM: CT GUIDED BONE MARROW ASPIRATION AND CORE BIOPSY Interventional Radiologist:  HCriselda Peaches MD MEDICATIONS: None. ANESTHESIA/SEDATION: Moderate (conscious) sedation was employed during this procedure. A total of 1 milligrams versed and 50 micrograms fentanyl were administered intravenously. The patient's level of consciousness and vital signs were monitored continuously by radiology nursing throughout the procedure under my direct supervision. Total monitored sedation time: 10 minutes FLUOROSCOPY TIME:  None. COMPLICATIONS: None immediate. Estimated blood loss: <25 mL PROCEDURE: Informed written consent was obtained from the patient after a thorough discussion of the procedural risks, benefits and alternatives. All questions were addressed. Maximal Sterile Barrier Technique was utilized including caps, mask, sterile gowns, sterile gloves, sterile drape, hand hygiene and skin antiseptic. A timeout was performed prior to the initiation of the procedure. The patient was positioned prone and non-contrast localization CT was performed of the pelvis to demonstrate the iliac marrow spaces. Maximal barrier sterile technique utilized including caps, mask, sterile gowns, sterile gloves, large sterile drape, hand hygiene, and betadine prep. Under sterile conditions and local anesthesia, an 11 gauge coaxial bone biopsy needle was advanced into the right iliac marrow space. Needle position was confirmed with CT imaging. Initially, bone marrow aspiration was performed. Next, the 11 gauge outer cannula was utilized to obtain a right iliac bone marrow core biopsy. Needle was removed. Hemostasis was obtained with compression. The patient tolerated the procedure well. Samples were prepared with the cytotechnologist. IMPRESSION: Technically  successful CT-guided bone marrow aspiration and core biopsy of the right iliac bone. Electronically Signed   By: HJacqulynn CadetM.D.   On: 06/01/2020 12:56   CT BONE MARROW BIOPSY & ASPIRATION  Result Date: 06/01/2020 INDICATION: Pancytopenia EXAM: CT GUIDED BONE MARROW ASPIRATION AND CORE BIOPSY Interventional Radiologist:  HCriselda Peaches MD MEDICATIONS: None. ANESTHESIA/SEDATION: Moderate (conscious) sedation was employed during this procedure. A total of 1 milligrams versed and 50 micrograms fentanyl were administered intravenously. The patient's level of consciousness and vital signs were monitored continuously by radiology nursing throughout the procedure under my direct supervision. Total monitored sedation time: 10 minutes FLUOROSCOPY TIME:  None. COMPLICATIONS: None immediate. Estimated blood loss: <25 mL PROCEDURE: Informed written consent was obtained from the patient after a thorough discussion of the procedural  risks, benefits and alternatives. All questions were addressed. Maximal Sterile Barrier Technique was utilized including caps, mask, sterile gowns, sterile gloves, sterile drape, hand hygiene and skin antiseptic. A timeout was performed prior to the initiation of the procedure. The patient was positioned prone and non-contrast localization CT was performed of the pelvis to demonstrate the iliac marrow spaces. Maximal barrier sterile technique utilized including caps, mask, sterile gowns, sterile gloves, large sterile drape, hand hygiene, and betadine prep. Under sterile conditions and local anesthesia, an 11 gauge coaxial bone biopsy needle was advanced into the right iliac marrow space. Needle position was confirmed with CT imaging. Initially, bone marrow aspiration was performed. Next, the 11 gauge outer cannula was utilized to obtain a right iliac bone marrow core biopsy. Needle was removed. Hemostasis was obtained with compression. The patient tolerated the procedure well. Samples  were prepared with the cytotechnologist. IMPRESSION: Technically successful CT-guided bone marrow aspiration and core biopsy of the right iliac bone. Electronically Signed   By: Jacqulynn Cadet M.D.   On: 06/01/2020 12:56   ECHOCARDIOGRAM COMPLETE  Result Date: 05/30/2020    ECHOCARDIOGRAM REPORT   Patient Name:   TANEISHA FUSON [facility] Date of Exam: 05/30/2020 Medical Rec #:  [id number]        Height:       63.0 in Accession #:    [id number]       Weight:       155.0 lb Date of Birth:  Jul 21, 1956       BSA:          1.735 m Patient Age:    64 years         BP:           114/64 mmHg Patient Gender: F                HR:           78 bpm. Exam Location:  Inpatient Procedure: 2D Echo Indications:    dyspnea 786.09  History:        Patient has no prior history of Echocardiogram examinations. No                 prior cardiac hx on file.  Sonographer:    Jannett Celestine RDCS (AE) Referring Phys: [place]  1. Left ventricular ejection fraction, by estimation, is 60 to 65%. The left ventricle has normal function. The left ventricle has no regional wall motion abnormalities. Left ventricular diastolic parameters were normal.  2. Right ventricular systolic function is normal. The right ventricular size is normal.  3. The mitral valve is normal in structure. Mild mitral valve regurgitation. No evidence of mitral stenosis.  4. The aortic valve is normal in structure. Aortic valve regurgitation is not visualized. No aortic stenosis is present.  5. The inferior vena cava is dilated in size with >50% respiratory variability, suggesting right atrial pressure of 8 mmHg. Comparison(s): No prior Echocardiogram. Conclusion(s)/Recommendation(s): Flow velocities are increased across all valves consistent with high cardiac output. Consider sepsis, thyrotoxicosis, anemia, etc. FINDINGS  Left Ventricle: Left ventricular ejection fraction, by estimation, is 60 to 65%. The left ventricle has normal function. The left  ventricle has no regional wall motion abnormalities. The left ventricular internal cavity size was normal in size. There is  no left ventricular hypertrophy. Left ventricular diastolic parameters were normal. Indeterminate filling pressures. Right Ventricle: The right ventricular size is normal. No increase in right ventricular wall thickness. Right ventricular systolic  function is normal. Left Atrium: Left atrial size was normal in size. Right Atrium: Right atrial size was normal in size. Pericardium: Trivial pericardial effusion is present. The pericardial effusion is circumferential. Mitral Valve: The mitral valve is normal in structure. Normal mobility of the mitral valve leaflets. Mild mitral valve regurgitation, with posteriorly-directed jet. No evidence of mitral valve stenosis. Tricuspid Valve: The tricuspid valve is normal in structure. Tricuspid valve regurgitation is not demonstrated. No evidence of tricuspid stenosis. Aortic Valve: The aortic valve is normal in structure. Aortic valve regurgitation is not visualized. No aortic stenosis is present. Aortic valve mean gradient measures 10.0 mmHg. Aortic valve peak gradient measures 18.8 mmHg. Aortic valve area, by VTI measures 2.05 cm. Pulmonic Valve: The pulmonic valve was normal in structure. Pulmonic valve regurgitation is not visualized. No evidence of pulmonic stenosis. Aorta: The aortic root is normal in size and structure. Venous: The inferior vena cava is dilated in size with greater than 50% respiratory variability, suggesting right atrial pressure of 8 mmHg. IAS/Shunts: No atrial level shunt detected by color flow Doppler.  LEFT VENTRICLE PLAX 2D LVIDd:         4.80 cm  Diastology LVIDs:         3.20 cm  LV e' lateral:   9.68 cm/s LV PW:         1.10 cm  LV E/e' lateral: 12.6 LV IVS:        1.00 cm  LV e' medial:    8.00 cm/s LVOT diam:     1.90 cm  LV E/e' medial:  15.2 LV SV:         90 LV SV Index:   52 LVOT Area:     2.84 cm  RIGHT VENTRICLE  RV Basal diam:  3.83 cm RV S prime:     15.30 cm/s TAPSE (M-mode): 3.3 cm LEFT ATRIUM             Index       RIGHT ATRIUM           Index LA diam:        3.50 cm 2.02 cm/m  RA Area:     18.30 cm LA Vol (A2C):   67.3 ml 38.79 ml/m RA Volume:   50.40 ml  29.05 ml/m LA Vol (A4C):   36.2 ml 20.86 ml/m LA Biplane Vol: 53.8 ml 31.01 ml/m  AORTIC VALVE AV Area (Vmax):    2.17 cm AV Area (Vmean):   1.98 cm AV Area (VTI):     2.05 cm AV Vmax:           217.00 cm/s AV Vmean:          150.000 cm/s AV VTI:            0.439 m AV Peak Grad:      18.8 mmHg AV Mean Grad:      10.0 mmHg LVOT Vmax:         166.00 cm/s LVOT Vmean:        105.000 cm/s LVOT VTI:          0.318 m LVOT/AV VTI ratio: 0.72  AORTA Ao Root diam: 2.90 cm MITRAL VALVE MV Area (PHT): 3.77 cm     SHUNTS MV Decel Time: 201 msec     Systemic VTI:  0.32 m MV E velocity: 122.00 cm/s  Systemic Diam: 1.90 cm MV A velocity: 88.30 cm/s MV E/A ratio:  1.38 Mihai Croitoru MD Electronically signed by Dani Gobble  Croitoru MD Signature Date/Time: 05/30/2020/9:08:12 AM    Final    US SPLEEN (ABDOMEN LIMITED)  Result Date: 05/30/2020 CLINICAL DATA:  64 year old female with splenomegaly. EXAM: ULTRASOUND ABDOMEN LIMITED COMPARISON:  None. FINDINGS: Targeted sonographic images of the left upper abdomen performed for assessment of the spleen. The spleen measures 11.8 x 4.9 x 11.6 cm for a volume of 350 cc. Partially visualized left pleural effusion. IMPRESSION: 1. No splenomegaly. 2. Left pleural effusion. Electronically Signed   By: Anner Crete M.D.   On: 05/30/2020 18:46   VAS [place] LOWER EXTREMITY VENOUS (DVT)  Result Date: 05/30/2020  Lower Venous DVTStudy Indications: Edema, and congestive heart failure.  Limitations: Poor ultrasound/tissue interface. Comparison Study: No prior study Performing Technologist: Maudry Mayhew MHA, RDMS, RVT, RDCS  Examination Guidelines: A complete evaluation includes B-mode imaging, spectral Doppler, color Doppler, and power  Doppler as needed of all accessible portions of each vessel. Bilateral testing is considered an integral part of a complete examination. Limited examinations for reoccurring indications may be performed as noted. The reflux portion of the exam is performed with the patient in reverse Trendelenburg.  +---------+---------------+---------+-----------+----------+--------------+ RIGHT    CompressibilityPhasicitySpontaneityPropertiesThrombus Aging +---------+---------------+---------+-----------+----------+--------------+ CFV      Full           No       Yes                                 +---------+---------------+---------+-----------+----------+--------------+ SFJ      Full                                                        +---------+---------------+---------+-----------+----------+--------------+ FV Prox  Full                                                        +---------+---------------+---------+-----------+----------+--------------+ FV Mid   Full                                                        +---------+---------------+---------+-----------+----------+--------------+ FV DistalFull                                                        +---------+---------------+---------+-----------+----------+--------------+ PFV      Full                                                        +---------+---------------+---------+-----------+----------+--------------+ POP      Full           No       Yes                                 +---------+---------------+---------+-----------+----------+--------------+  PTV      Full                                                        +---------+---------------+---------+-----------+----------+--------------+ PERO     Full                                                        +---------+---------------+---------+-----------+----------+--------------+   Right Technical Findings: Not visualized segments  include Limited evaluation right PTV, peroneal veins.  +---------+---------------+---------+-----------+----------+--------------+ LEFT     CompressibilityPhasicitySpontaneityPropertiesThrombus Aging +---------+---------------+---------+-----------+----------+--------------+ CFV      Full           No       Yes                                 +---------+---------------+---------+-----------+----------+--------------+ SFJ      Full                                                        +---------+---------------+---------+-----------+----------+--------------+ FV Prox  Full                                                        +---------+---------------+---------+-----------+----------+--------------+ FV Mid   Full                                                        +---------+---------------+---------+-----------+----------+--------------+ FV DistalFull                                                        +---------+---------------+---------+-----------+----------+--------------+ PFV      Full                                                        +---------+---------------+---------+-----------+----------+--------------+ POP      Full           No       Yes                                 +---------+---------------+---------+-----------+----------+--------------+ PTV      Full                                                        +---------+---------------+---------+-----------+----------+--------------+  PERO     Full                                                        +---------+---------------+---------+-----------+----------+--------------+   Left Technical Findings: Not visualized segments include Limited evaluation left PTV, peroneal veins.   Summary: RIGHT: - There is no evidence of deep vein thrombosis in the lower extremity. However, portions of this examination were limited- see technologist comments above.  - No cystic  structure found in the popliteal fossa.  LEFT: - There is no evidence of deep vein thrombosis in the lower extremity. However, portions of this examination were limited- see technologist comments above.  - A complex cystic structure is found in the popliteal fossa.  *See table(s) above for measurements and observations. Electronically signed by Deitra Mayo MD on 05/30/2020 at 8:09:22 PM.    Final     Microbiology: Recent Results (from the past 240 hour(s))  SARS Coronavirus 2 by RT PCR (hospital order, performed in [facility] hospital lab) Nasopharyngeal Nasopharyngeal Swab     Status: None   Collection Time: 05/29/20 10:24 PM   Specimen: Nasopharyngeal Swab  Result Value Ref Range Status   SARS Coronavirus 2 NEGATIVE NEGATIVE Final    Comment: (NOTE) SARS-CoV-2 target nucleic acids are NOT DETECTED.  The SARS-CoV-2 RNA is generally detectable in upper and lower respiratory specimens during the acute phase of infection. The lowest concentration of SARS-CoV-2 viral copies this assay can detect is 250 copies / mL. A negative result does not preclude SARS-CoV-2 infection and should not be used as the sole basis for treatment or other patient management decisions.  A negative result may occur with improper specimen collection / handling, submission of specimen other than nasopharyngeal swab, presence of viral mutation(s) within the areas targeted by this assay, and inadequate number of viral copies (<250 copies / mL). A negative result must be combined with clinical observations, patient history, and epidemiological information.  Fact Sheet for Patients:   StrictlyIdeas.no  Fact Sheet for Healthcare Providers: BankingDealers.co.za  This test is not yet approved or  cleared by the [place] FDA and has been authorized for detection and/or diagnosis of SARS-CoV-2 by FDA under an Emergency Use Authorization (EUA).  This EUA will  remain in effect (meaning this test can be used) for the duration of the COVID-19 declaration under Section 564(b)(1) of the Act, 21 U.S.C. section 360bbb-3(b)(1), unless the authorization is terminated or revoked sooner.  Performed at [facility], [place] [address], [place], [place] [address]      Labs: Basic Metabolic Panel: Recent Labs  Lab 06/01/20 0950 06/02/20 0453 06/03/20 0525 06/04/20 0537 06/05/20 0822  NA 138 137 137 135 130*  K 4.0 3.6 3.6 3.7 3.4*  CL 98 99 98 99 91*  CO2 '28 28 29 27 28  ' GLUCOSE 102* 100* 98 101* 107*  BUN '20 22 19 17 18  ' CREATININE 0.85 0.83 0.73 0.71 0.69  CALCIUM 8.3* 8.2* 8.2* 8.2* 8.2*  MG  --   --   --  1.9  --    Liver Function Tests: Recent Labs  Lab 05/29/20 2125 06/01/20 0950  AST 23 23  ALT 37 35  ALKPHOS 92 103  BILITOT 0.6 1.2  PROT 6.1* 6.1*  ALBUMIN 3.8 3.7   No results  for input(s): LIPASE, AMYLASE in the last 168 hours. Recent Labs  Lab 06/01/20 0950  AMMONIA 28   CBC: Recent Labs  Lab 06/01/20 0950 06/02/20 0453 06/03/20 0525 06/04/20 0537 06/05/20 0822  WBC 1.3* 1.9* 1.7* 1.9* 1.8*  NEUTROABS 0.1* 0.2* 0.3* 0.3* 0.4*  HGB 10.0* 9.3* 8.6* 8.6* 9.2*  HCT 30.5* 28.6* 27.0* 27.4* 28.2*  MCV 97.1 98.6 100.0 102.2* 100.0  PLT 159 146* 151 155 158   Cardiac Enzymes: No results for input(s): CKTOTAL, CKMB, CKMBINDEX, TROPONINI in the last 168 hours. BNP: BNP (last 3 results) Recent Labs    05/29/20 2125  BNP 101.3*    ProBNP (last 3 results) No results for input(s): PROBNP in the last 8760 hours.  CBG: No results for input(s): GLUCAP in the last 168 hours.     Signed:  Irine Seal MD.  [facility] 06/05/2020, 12:19 PM

## 2020-06-05 NOTE — Progress Notes (Signed)
Occupational Therapy Treatment Patient Details Name: Andrea Crawford MRN: [id number] DOB: 03-08-1956 Today's Date: 06/05/2020    History of present illness 64 year old female with history of epilepsy/Szs and schizophrenia, on multiple antiepileptics. ED work-up revealed bilateral lower extremity edema, chest x-ray showing bilateral pleural effusion, cardiomegaly raising possibility of CHF versus pneumonia. also work up in progress for severe anemia/neutropenia/pancytopenia of unknown etiology.   OT comments  Patient progressing and showed improved 6-clicks AM-PAC score of occupational performance, from a 15/24 on previous session to an 18/24 today.  Patient limited by slow, unsteady movements, decreased safety awareness and decreased activity tolerance, along with deficits noted below. Pt continues to demonstrate good rehab potential and would benefit from continued skilled OT to increase safety and independence with ADLs and functional transfers to allow pt to return home safely and reduce caregiver burden and fall risk.    Follow Up Recommendations  Home health OT;SNF;Supervision/Assistance - 24 hour    Equipment Recommendations  3 in 1 bedside commode (RW)    Recommendations for Other Services      Precautions / Restrictions Precautions Precautions: Fall Restrictions Weight Bearing Restrictions: No       Mobility Bed Mobility Overal bed mobility: Needs Assistance Bed Mobility: Supine to Sit     Supine to sit: HOB elevated;Supervision     General bed mobility comments: Verbal cues for motor planning OOB.  Transfers Overall transfer level: Needs assistance Equipment used: Rolling walker (2 wheeled) Transfers: Sit to/from Stand Sit to Stand: Min assist Stand pivot transfers: Min assist       General transfer comment: Initially moderate assist for stand to sit to recliner as pt impulsively attempted to sit too far from recliner. Increased time spent educating pt/husband  on safety with Sit<>stands with use of RW, with step by step instructions to maximize safety. Pt performed subsequent transitions from stand to sit with supervision to contact guard assist.    Balance Overall balance assessment: History of Falls Sitting-balance support: Single extremity supported Sitting balance-Leahy Scale: Fair     Standing balance support: Bilateral upper extremity supported;Single extremity supported Standing balance-Leahy Scale: Poor                             ADL either performed or assessed with clinical judgement   ADL       Grooming: Standing;Wash/dry face;Oral care;Wash/dry hands Grooming Details (indicate cue type and reason): Pt stood at sink once and performed oral hygiene and face wash.  Stood at sink a second time and washed hands post-toileting. Pt educated on position of RW anterior to sink for safety.         Upper Body Dressing : Set up;Sitting   Lower Body Dressing: Sit to/from stand;Min guard Lower Body Dressing Details (indicate cue type and reason): Pt required increased time and contact guard assist, but able to negotiate clothing over hips to don/doff before and after toileting. Toilet Transfer: Systems analyst Details (indicate cue type and reason): Pt required verbal cues for use of wall mounted grab bar which pt has at home. Pt able to  descend and stand from toilet with contact guard assist. Toileting- Clothing Manipulation and Hygiene: Min guard;Sit to/from stand;Sitting/lateral lean;Cueing for safety Toileting - Clothing Manipulation Details (indicate cue type and reason): Please see LE dressing above.     Functional mobility during ADLs: Minimal assistance;Min guard;Rolling walker General ADL Comments: Pt performed ~20' in-room ambulation  without AD, and using Minimal assist/ HHA.  Very slow, shuffling gait.  Used RW to go to bathroom with contact guard assist, very slow pace  with kyphotic posture and difficulty raising head.     Vision Patient Visual Report: No change from baseline     Perception     Praxis      Cognition Arousal/Alertness: Awake/alert Behavior During Therapy: WFL for tasks assessed/performed Overall Cognitive Status: Within Functional Limits for tasks assessed                                 General Comments: speech difficult to understand at times, follows commands and answers questions consistently, Ox4        Exercises General Exercises - Lower Extremity Hip Flexion/Marching: AROM;Both;5 reps Other Exercises Other Exercises: repeated sit<>stand x4 for strengthening and instruction   Shoulder Instructions       General Comments      Pertinent Vitals/ Pain       Pain Assessment: No/denies pain  Home Living                                          Prior Functioning/Environment              Frequency  Min 2X/week        Progress Toward Goals  OT Goals(current goals can now be found in the care plan section)  Progress towards OT goals: Progressing toward goals  Acute Rehab OT Goals Patient Stated Goal: Return to Gardening OT Goal Formulation: With patient/family Time For Goal Achievement: 06/16/20 Potential to Achieve Goals: Good  Plan Discharge plan remains appropriate    Co-evaluation                 AM-PAC OT "6 Clicks" Daily Activity     Outcome Measure   Help from another person eating meals?: None Help from another person taking care of personal grooming?: A Little (standing) Help from another person toileting, which includes using toliet, bedpan, or urinal?: A Little Help from another person bathing (including washing, rinsing, drying)?: A Little Help from another person to put on and taking off regular upper body clothing?: A Little Help from another person to put on and taking off regular lower body clothing?: A Lot 6 Click Score: 18    End of  Session Equipment Utilized During Treatment: Gait belt;Rolling walker  OT Visit Diagnosis: Unsteadiness on feet (R26.81);Muscle weakness (generalized) (M62.81);History of falling (Z91.81);Repeated falls (R29.6)   Activity Tolerance Patient limited by fatigue   Patient Left in chair;with call bell/phone within reach;with family/visitor present   Nurse Communication Mobility status        Time: 9292-4462 OT Time Calculation (min): 50 min  Charges: OT General Charges $OT Visit: 1 Visit OT Treatments $Self Care/Home Management : 23-37 mins $Therapeutic Activity: 8-22 mins  Anderson Malta, OT Acute Rehab Services Office: [phone] 06/05/2020    Julien Girt 06/05/2020, 1:52 PM

## 2020-06-05 NOTE — TOC Progression Note (Signed)
Transition of Care [facility]) - Progression Note    Patient Details  Name: Andrea Crawford MRN: [id number] Date of Birth: Apr 22, 1956  [facility] CM/SW Contact  Ixchel Duck, Marjie Skiff, RN Phone Number: 06/05/2020, 10:48 AM  Clinical Narrative:    This CM met with pt and husband at bedside for DC planning. They have decided that pt will go home at dc instead of SNF. Choice offered for [facility] and [facility] chosen. [employer] contacted for 3in1 and RW.   Expected Discharge Plan: [place] Barriers to Discharge: No Barriers Identified  Expected Discharge Plan and Services Expected Discharge Plan: [place] In-house Referral: NA Discharge Planning Services: CM Consult Post Acute Care Choice: [place] arrangements for the past 2 months: Single Family Home                 DME Arranged: 3-N-1, Walker rolling DME Agency: [employer] Date DME Agency Contacted: 06/05/20 Time DME Agency Contacted: 1000 Representative spoke with at DME Agency: Thedore Mins HH Arranged: PT, OT, Social Work [employer] Agency: [place] Date [place]: 06/05/20 Time [place]: 1000 Representative spoke with at [place]: [place] ([place]) Interventions    Readmission Risk Interventions Readmission Risk Prevention Plan 06/04/2020  Transportation Screening Complete  Some recent data might be hidden

## 2020-06-05 NOTE — Progress Notes (Signed)
Physical Therapy Treatment Patient Details Name: Andrea Crawford MRN: [id number] DOB: [birth year] Today's Date: 06/05/2020    History of Present Illness 64 year old female with history of epilepsy/Szs, on multiple antiepileptics. ED work-up revealed bilateral lower extremity edema, chest x-ray showing bilateral pleural effusion, cardiomegaly raising possibility of CHF versus pneumonia. also work up in progress for severe anemia/neutropenia/pancytopenia of unknown etiology    PT Comments    Pt progressing toward goals; supportive spouse present for PT session. Pt amb hallway distance with RW and min assist. Verbally reviewed/simulated up/down  Stairs with HHA.  Will benefit from HHPT.  Provided pt/husband with gait belt for home use  Follow Up Recommendations  Home health PT;Supervision for mobility/OOB     Equipment Recommendations  Rolling walker with 5" wheels (youth ht )    Recommendations for Other Services       Precautions / Restrictions Precautions Precautions: Fall Restrictions Weight Bearing Restrictions: No    Mobility  Bed Mobility               General bed mobility comments: pt up in chair on arrival   Transfers Overall transfer level: Needs assistance Equipment used: Rolling walker (2 wheeled) Transfers: Sit to/from Stand Sit to Stand: Min assist         General transfer comment: cues for hand placement, assist to rise and stabilize   Ambulation/Gait Ambulation/Gait assistance: Min assist Gait Distance (Feet): 85 Feet Assistive device: Rolling walker (2 wheeled) Gait Pattern/deviations: Step-through pattern;Decreased stride length;Trunk flexed;Narrow base of support;Drifts right/left Gait velocity: decr   General Gait Details: assist to balance and maneuver RW, cues for RW position    Stairs             Wheelchair Mobility    Modified Rankin (Stroke Patients Only)       Balance             Standing balance-Leahy Scale:  Poor                              Cognition Arousal/Alertness: Awake/alert Behavior During Therapy: WFL for tasks assessed/performed Overall Cognitive Status: Within Functional Limits for tasks assessed                                 General Comments: speech difficult to understand at times, follows commands and answers questions consistently      Exercises General Exercises - Lower Extremity Hip Flexion/Marching: AROM;Both;5 reps Other Exercises Other Exercises: repeated sit<>stand x4 for strengthening and instruction    General Comments        Pertinent Vitals/Pain Pain Assessment: No/denies pain    Home Living                      Prior Function            PT Goals (current goals can now be found in the care plan section) Acute Rehab PT Goals Patient Stated Goal: get back to working in yard PT Goal Formulation: With patient Time For Goal Achievement: 06/17/20 Potential to Achieve Goals: Good Progress towards PT goals: Progressing toward goals    Frequency    Min 3X/week      PT Plan Current plan remains appropriate    Co-evaluation              AM-PAC PT "6 Clicks" Mobility  Outcome Measure  Help needed turning from your back to your side while in a flat bed without using bedrails?: A Little Help needed moving from lying on your back to sitting on the side of a flat bed without using bedrails?: A Little Help needed moving to and from a bed to a chair (including a wheelchair)?: A Little Help needed standing up from a chair using your arms (e.g., wheelchair or bedside chair)?: A Little Help needed to walk in hospital room?: A Little Help needed climbing 3-5 steps with a railing? : A Little 6 Click Score: 18    End of Session Equipment Utilized During Treatment: Gait belt Activity Tolerance: Patient tolerated treatment well Patient left: in chair;with call bell/phone within reach;with family/visitor present    PT Visit Diagnosis: Other abnormalities of gait and mobility (R26.89);Unsteadiness on feet (R26.81);History of falling (Z91.81)     Time: 0375-4360 PT Time Calculation (min) (ACUTE ONLY): 26 min  Charges:  $Gait Training: 23-37 mins                     Baxter Flattery, PT  Acute Rehab Dept ([place]) [phone] Pager [phone]  06/05/2020    [facility] 06/05/2020, 11:29 AM

## 2020-06-06 ENCOUNTER — Telehealth: Payer: Self-pay

## 2020-06-06 NOTE — Telephone Encounter (Signed)
-----  Message from Heath Lark, MD sent at 06/06/2020 11:42 AM EDT ----- Regarding: bone marrow biopsy Can you call Dr. Melina Copa and ask if she has ordered MDS panel on her bone marrow biopsy from last week?

## 2020-06-06 NOTE — Telephone Encounter (Signed)
Called and spoke with pathology at [facility]. They will add the test.

## 2020-06-08 ENCOUNTER — Other Ambulatory Visit: Payer: Self-pay | Admitting: Hematology and Oncology

## 2020-06-08 DIAGNOSIS — N179 Acute kidney failure, unspecified: Secondary | ICD-10-CM | POA: Diagnosis not present

## 2020-06-08 DIAGNOSIS — D709 Neutropenia, unspecified: Secondary | ICD-10-CM | POA: Diagnosis not present

## 2020-06-08 DIAGNOSIS — D72819 Decreased white blood cell count, unspecified: Secondary | ICD-10-CM | POA: Diagnosis not present

## 2020-06-08 DIAGNOSIS — J90 Pleural effusion, not elsewhere classified: Secondary | ICD-10-CM | POA: Diagnosis not present

## 2020-06-08 DIAGNOSIS — Z9181 History of falling: Secondary | ICD-10-CM | POA: Diagnosis not present

## 2020-06-08 DIAGNOSIS — R410 Disorientation, unspecified: Secondary | ICD-10-CM | POA: Diagnosis not present

## 2020-06-08 DIAGNOSIS — I119 Hypertensive heart disease without heart failure: Secondary | ICD-10-CM | POA: Diagnosis not present

## 2020-06-08 DIAGNOSIS — R4781 Slurred speech: Secondary | ICD-10-CM | POA: Diagnosis not present

## 2020-06-08 DIAGNOSIS — D61818 Other pancytopenia: Secondary | ICD-10-CM | POA: Diagnosis not present

## 2020-06-08 DIAGNOSIS — F329 Major depressive disorder, single episode, unspecified: Secondary | ICD-10-CM | POA: Diagnosis not present

## 2020-06-08 DIAGNOSIS — I313 Pericardial effusion (noninflammatory): Secondary | ICD-10-CM | POA: Diagnosis not present

## 2020-06-08 DIAGNOSIS — D649 Anemia, unspecified: Secondary | ICD-10-CM | POA: Diagnosis not present

## 2020-06-08 DIAGNOSIS — G40909 Epilepsy, unspecified, not intractable, without status epilepticus: Secondary | ICD-10-CM | POA: Diagnosis not present

## 2020-06-08 DIAGNOSIS — F209 Schizophrenia, unspecified: Secondary | ICD-10-CM | POA: Diagnosis not present

## 2020-06-08 DIAGNOSIS — M1712 Unilateral primary osteoarthritis, left knee: Secondary | ICD-10-CM | POA: Diagnosis not present

## 2020-06-08 DIAGNOSIS — I051 Rheumatic mitral insufficiency: Secondary | ICD-10-CM | POA: Diagnosis not present

## 2020-06-08 DIAGNOSIS — G259 Extrapyramidal and movement disorder, unspecified: Secondary | ICD-10-CM | POA: Diagnosis not present

## 2020-06-08 DIAGNOSIS — G4733 Obstructive sleep apnea (adult) (pediatric): Secondary | ICD-10-CM | POA: Diagnosis not present

## 2020-06-08 DIAGNOSIS — R600 Localized edema: Secondary | ICD-10-CM | POA: Diagnosis not present

## 2020-06-09 ENCOUNTER — Encounter: Payer: Self-pay | Admitting: Hematology and Oncology

## 2020-06-09 ENCOUNTER — Inpatient Hospital Stay: Payer: Medicare Other | Attending: Hematology and Oncology

## 2020-06-09 ENCOUNTER — Inpatient Hospital Stay (HOSPITAL_BASED_OUTPATIENT_CLINIC_OR_DEPARTMENT_OTHER): Payer: Medicare Other | Admitting: Hematology and Oncology

## 2020-06-09 ENCOUNTER — Encounter (HOSPITAL_COMMUNITY): Payer: Self-pay | Admitting: Hematology and Oncology

## 2020-06-09 ENCOUNTER — Other Ambulatory Visit: Payer: Self-pay

## 2020-06-09 VITALS — BP 120/81 | HR 73 | Temp 98.1°F | Resp 20 | Ht 63.0 in | Wt 145.4 lb

## 2020-06-09 DIAGNOSIS — Z79899 Other long term (current) drug therapy: Secondary | ICD-10-CM | POA: Insufficient documentation

## 2020-06-09 DIAGNOSIS — R531 Weakness: Secondary | ICD-10-CM | POA: Diagnosis not present

## 2020-06-09 DIAGNOSIS — R161 Splenomegaly, not elsewhere classified: Secondary | ICD-10-CM | POA: Diagnosis not present

## 2020-06-09 DIAGNOSIS — D469 Myelodysplastic syndrome, unspecified: Secondary | ICD-10-CM

## 2020-06-09 DIAGNOSIS — G40909 Epilepsy, unspecified, not intractable, without status epilepticus: Secondary | ICD-10-CM | POA: Diagnosis not present

## 2020-06-09 DIAGNOSIS — R5381 Other malaise: Secondary | ICD-10-CM | POA: Insufficient documentation

## 2020-06-09 DIAGNOSIS — I6782 Cerebral ischemia: Secondary | ICD-10-CM | POA: Diagnosis not present

## 2020-06-09 DIAGNOSIS — D61818 Other pancytopenia: Secondary | ICD-10-CM

## 2020-06-09 DIAGNOSIS — D649 Anemia, unspecified: Secondary | ICD-10-CM | POA: Diagnosis not present

## 2020-06-09 DIAGNOSIS — F209 Schizophrenia, unspecified: Secondary | ICD-10-CM | POA: Insufficient documentation

## 2020-06-09 DIAGNOSIS — I509 Heart failure, unspecified: Secondary | ICD-10-CM | POA: Insufficient documentation

## 2020-06-09 DIAGNOSIS — J90 Pleural effusion, not elsewhere classified: Secondary | ICD-10-CM | POA: Diagnosis not present

## 2020-06-09 LAB — CBC WITH DIFFERENTIAL/PLATELET
Abs Immature Granulocytes: 0.01 10*3/uL (ref 0.00–0.07)
Basophils Absolute: 0 10*3/uL (ref 0.0–0.1)
Basophils Relative: 1 %
Eosinophils Absolute: 0.1 10*3/uL (ref 0.0–0.5)
Eosinophils Relative: 4 %
HCT: 30.7 % — ABNORMAL LOW (ref 36.0–46.0)
Hemoglobin: 9.5 g/dL — ABNORMAL LOW (ref 12.0–15.0)
Immature Granulocytes: 1 %
Lymphocytes Relative: 34 %
Lymphs Abs: 0.7 10*3/uL (ref 0.7–4.0)
MCH: 31.8 pg (ref 26.0–34.0)
MCHC: 30.9 g/dL (ref 30.0–36.0)
MCV: 102.7 fL — ABNORMAL HIGH (ref 80.0–100.0)
Monocytes Absolute: 0.8 10*3/uL (ref 0.1–1.0)
Monocytes Relative: 35 %
Neutro Abs: 0.5 10*3/uL — ABNORMAL LOW (ref 1.7–7.7)
Neutrophils Relative %: 25 %
Platelets: 204 10*3/uL (ref 150–400)
RBC: 2.99 MIL/uL — ABNORMAL LOW (ref 3.87–5.11)
RDW: 19.3 % — ABNORMAL HIGH (ref 11.5–15.5)
WBC: 2.2 10*3/uL — ABNORMAL LOW (ref 4.0–10.5)
nRBC: 0 % (ref 0.0–0.2)

## 2020-06-09 LAB — SAMPLE TO BLOOD BANK

## 2020-06-09 NOTE — Assessment & Plan Note (Signed)
She will continue close follow-up with her neurologist According to her husband, she has no recent seizures I do not believe her antiseizure medications are the cause of her severe pancytopenia

## 2020-06-09 NOTE — Assessment & Plan Note (Signed)
She has severe pancytopenia but does not need blood transfusion today I will establish transfusion threshold to give her a unit of blood if her hemoglobin is less than 8 We discussed neutropenic precaution She does not need G-CSF support or prophylactic antibiotics

## 2020-06-09 NOTE — Patient Instructions (Signed)
1) If she develops fever of 101 F or higher, she needs to go to the ER

## 2020-06-09 NOTE — Progress Notes (Signed)
[place] OFFICE PROGRESS NOTE  Patient Care Team: [name], MD as PCP - General (Family Medicine)  ASSESSMENT & PLAN:  MDS (myelodysplastic syndrome) ([place]) Preliminary bone marrow results suggest myelodysplastic syndrome We have ordered extensive panel of blood work that excluded nutritional deficiency in the hospital I will add copper level and serum erythropoietin level to her next blood draw Hopefully, next generation sequencing panel/FISH results will be available by next week If confirmed the diagnosis of myelodysplastic syndrome, her calculated revised IPSS score right now is at the low risk category She will likely need intermittent blood transfusions in the future Depending on future EPO level, she might be a candidate for darbepoetin injections We discussed the general approach to myelodysplastic syndrome At this point in time, she does not need chemotherapy I will see her next week again and follow her blood counts closely in case she need transfusion support again in the future  Pancytopenia, acquired ([place]) She has severe pancytopenia but does not need blood transfusion today I will establish transfusion threshold to give her a unit of blood if her hemoglobin is less than 8 We discussed neutropenic precaution She does not need G-CSF support or prophylactic antibiotics  Epilepsy ([place]) She will continue close follow-up with her neurologist According to her husband, she has no recent seizures I do not believe her antiseizure medications are the cause of her severe pancytopenia  Physical debility She is very debilitated and weak She is undergoing home physical therapy She had one recent fall but no injury   Orders Placed This Encounter  Procedures  . Erythropoietin    Standing Status:   Future    Standing Expiration Date:   06/09/2021  . Copper, serum    Standing Status:   Future    Standing Expiration Date:   06/09/2021    All questions were  answered. The patient knows to call the clinic with any problems, questions or concerns. The total time spent in the appointment was 55 minutes encounter with patients including review of chart and various tests results, discussions about plan of care and coordination of care plan   Heath Lark, MD 06/09/2020 1:52 PM  INTERVAL HISTORY: Please see below for problem oriented charting. She returns with her husband for further follow-up Her husband provided most of the history Since discharge from the hospital, she had one episode of fall She had no major injuries The patient denies any recent signs or symptoms of bleeding such as spontaneous epistaxis, hematuria or hematochezia. No recent seizures or confusion episodes Her husband is monitoring her medication intake No recent fever or chills  SUMMARY OF ONCOLOGIC HISTORY: Oncology History Overview Note  Cytogenetics from bone marrow biopsy from 06/01/2020 is normal karyotype Calculated R-IPSS score is low risk   MDS (myelodysplastic syndrome) ([place])  05/29/2020 - 06/05/2020 Hospital Admission   She was hospitalized for severe pancytopenia requiring multiple units of blood transfusions along with bone marrow aspirate and biopsy   05/30/2020 Initial Diagnosis   HPI: Ms. Hulick is a 64 year old female with a past medical history significant for epilepsy, schizophrenia, and sleep apnea.  The patient presented to the emergency room due to abnormal lab work performed by her primary care provider.  CBC on admission showed a WBC of 1.4, hemoglobin 4.7, hematocrit 15.6, and platelet count of 191,000.  Chemistry significant for a BUN of 28, creatinine 1.2, calcium 8.3, and total protein of 6.1.  Absolute reticulocytes were decreased at 17.0, vitamin B12 level normal  at 197, folate normal at 12.6, ferritin normal at 169 with a normal iron, TIBC, percent saturation, and UIBC.  The patient received multiple units of blood transfusion and was subsequently admitted  to the hospital for further management   06/01/2020 Pathology Results   BONE MARROW, ASPIRATE, CLOT, CORE:  -  Hypercellular marrow with dyspoieisis and ringed sideroblasts  -  See comment and microscopic description below   PERIPHERAL BLOOD:  -  Pancytopenia  -  See complete blood cell count below   COMMENT:   Overall, the findings are concerning for a myelodysplastic syndrome especially myelodysplasia with ringed sideroblasts (MDS-RS).  However, non-clonal causes of dysplasia must be excluded before the diagnosis of myelodysplasia is established. These include, but are not limited to, drug and toxin exposure, growth factor therapy, viral infections, immunologic disorders, and nutritional deficiencies (e.g., B12, copper, etc.).  Correlation with cytogenetics is recommended.  NGS panel (myeloid neoplasm) may be of interest.      REVIEW OF SYSTEMS:   Constitutional: Denies fevers, chills or abnormal weight loss Eyes: Denies blurriness of vision Ears, nose, mouth, throat, and face: Denies mucositis or sore throat Respiratory: Denies cough, dyspnea or wheezes Cardiovascular: Denies palpitation, chest discomfort or lower extremity swelling Gastrointestinal:  Denies nausea, heartburn or change in bowel habits Skin: Denies abnormal skin rashes Lymphatics: Denies new lymphadenopathy or easy bruising Behavioral/Psych: Mood is stable, no new changes  All other systems were reviewed with the patient and are negative.  I have reviewed the past medical history, past surgical history, social history and family history with the patient and they are unchanged from previous note.  ALLERGIES:  has No Known Allergies.  MEDICATIONS:  Current Outpatient Medications  Medication Sig Dispense Refill  . acetaminophen (TYLENOL) 500 MG tablet Take 1,000 mg by mouth every 6 (six) hours as needed for headache (pain).    Marland Kitchen amantadine (SYMMETREL) 100 MG capsule Take 100 mg by mouth 2 (two) times daily.     .  ARIPiprazole (ABILIFY) 15 MG tablet Take 1 tablet (15 mg total) by mouth at bedtime.    . Ascorbic Acid (VITAMIN C) 1000 MG tablet Take 1,000 mg by mouth 2 (two) times a day.    . cholecalciferol (VITAMIN D3) 25 MCG (1000 UT) tablet Take 1,000 Units by mouth daily as needed (sunshine replacement).    . cloBAZam (ONFI) 10 MG tablet Take 5-15 mg by mouth See admin instructions. 0.5 tablet (5 MG) in the morning and 1.5 tablets (15 MG) in the evening    . lacosamide (VIMPAT) 50 MG TABS tablet Take 50-150 mg by mouth See admin instructions. Take 1 tablet (50 mg) by mouth with breakfast and lunch, take 3 tablets (150 mg) at bedtime    . lamoTRIgine (LAMICTAL) 200 MG tablet Take 200-400 mg by mouth See admin instructions. Take one tablet (200 mg) by mouth with breakfast and lunch, take two tablets (400 mg) at bedtime    . levETIRAcetam (KEPPRA) 1000 MG tablet Take 500-1,000 mg by mouth See admin instructions. Take 1/2 tablet (500 mg) by mouth with breakfast and lunch, take 1 tablet (1000 mg) at bedtime    . omeprazole (PRILOSEC) 40 MG capsule Take 40 mg by mouth at bedtime.    Marland Kitchen PRESCRIPTION MEDICATION Inhale into the lungs at bedtime. CPAP    . pyridOXINE (VITAMIN B-6) 100 MG tablet Take 600 mg by mouth daily.    . vitamin B-12 (CYANOCOBALAMIN) 1000 MCG tablet Take 1 tablet (1,000 mcg total) by  mouth daily.    Marland Kitchen zinc gluconate 50 MG tablet Take 200 mg by mouth in the morning, at noon, in the evening, and at bedtime.     No current facility-administered medications for this visit.    PHYSICAL EXAMINATION: ECOG PERFORMANCE STATUS: 2 - Symptomatic, <50% confined to bed  Vitals:   06/09/20 1108  BP: 120/81  Pulse: 73  Resp: 20  Temp: 98.1 F (36.7 C)  SpO2: 100%   Filed Weights   06/09/20 1108  Weight: 145 lb 6.4 oz (66 kg)    GENERAL:alert, no distress and comfortable NEURO: alert & oriented x 3 with fluent speech, no focal motor/sensory deficits  LABORATORY DATA:  I have reviewed the  data as listed    Component Value Date/Time   NA 130 (L) 06/05/2020 0822   K 3.4 (L) 06/05/2020 0822   CL 91 (L) 06/05/2020 0822   CO2 28 06/05/2020 0822   GLUCOSE 107 (H) 06/05/2020 0822   BUN 18 06/05/2020 0822   CREATININE 0.69 06/05/2020 0822   CALCIUM 8.2 (L) 06/05/2020 0822   PROT 6.1 (L) 06/01/2020 0950   ALBUMIN 3.7 06/01/2020 0950   AST 23 06/01/2020 0950   ALT 35 06/01/2020 0950   ALKPHOS 103 06/01/2020 0950   BILITOT 1.2 06/01/2020 0950   GFRNONAA >60 06/05/2020 0822   GFRAA >60 06/05/2020 0822    No results found for: SPEP, UPEP  Lab Results  Component Value Date   WBC 2.2 (L) 06/09/2020   NEUTROABS 0.5 (L) 06/09/2020   HGB 9.5 (L) 06/09/2020   HCT 30.7 (L) 06/09/2020   MCV 102.7 (H) 06/09/2020   PLT 204 06/09/2020      Chemistry      Component Value Date/Time   NA 130 (L) 06/05/2020 0822   K 3.4 (L) 06/05/2020 0822   CL 91 (L) 06/05/2020 0822   CO2 28 06/05/2020 0822   BUN 18 06/05/2020 0822   CREATININE 0.69 06/05/2020 0822      Component Value Date/Time   CALCIUM 8.2 (L) 06/05/2020 0822   ALKPHOS 103 06/01/2020 0950   AST 23 06/01/2020 0950   ALT 35 06/01/2020 0950   BILITOT 1.2 06/01/2020 0950       RADIOGRAPHIC STUDIES: I have personally reviewed the radiological images as listed and agreed with the findings in the report. EEG  Result Date: 06/01/2020 Lora Havens, MD     06/01/2020  5:22 PM Patient Name: Andrea Crawford MRN: [id number] Epilepsy Attending: Lora Havens Referring Physician/Provider: Etta Quill, PA Date: 06/01/2020 Duration: 24.31 mins Patient history: Patient was initially brought in for cough and dry mouth.  While in the hospital noted she had increased slurred speech husband wanted to discuss with aneurologist about her hallucinations to which she is seeing a psychiatrist already.  Unfortunately patient's husband is a poor historian.  Per husband she has had slurred speech for possibly 2 years but it has worsened. EEG to  evaluate for seizure. Level of alertness: Awake, drowsy AEDs during EEG study: LCM, LTG, clobazam Technical aspects: This EEG study was done with scalp electrodes positioned according to the 10-20 International system of electrode placement. Electrical activity was acquired at a sampling rate of '500Hz'  and reviewed with a high frequency filter of '70Hz'  and a low frequency filter of '1Hz' . EEG data were recorded continuously and digitally stored. Description: The posterior dominant rhythm consists of 8 Hz activity of moderate voltage (25-35 uV) seen predominantly in posterior head regions, symmetric and  reactive to eye opening and eye closing. Drowsiness was characterized by attenuation of the posterior background rhythm. There is an excessive amount of 15 to 18 Hz beta activity distributed symmetrically and diffusely. Physiologic photic driving was seen during photic stimulation.  Hyperventilation was not performed.   ABNORMALITY -Excessive beta, generalized IMPRESSION: This study is within normal limits. The excessive beta activity seen in the background is most likely due to the effect of benzodiazepine and is a benign EEG pattern. No seizures or epileptiform discharges were seen throughout the recording. Lora Havens   DG Chest 1 View  Result Date: 05/29/2020 CLINICAL DATA:  Dyspnea EXAM: CHEST  1 VIEW COMPARISON:  05/29/2020 FINDINGS: Small bilateral pleural effusions appears slightly increased. Enlarged cardiomediastinal silhouette with vascular congestion. Increased bibasilar airspace disease. No pneumothorax. IMPRESSION: 1. Cardiomegaly with vascular congestion. 2. Increasing bilateral pleural effusions and bibasilar atelectasis or pneumonia. Electronically Signed   By: Donavan Foil M.D.   On: 05/29/2020 22:53   CT HEAD WO CONTRAST  Result Date: 05/30/2020 CLINICAL DATA:  Neuro deficit, acute, stroke suspected. Additional history provided: Patient experiencing increasing peripheral edema with shortness  of breath over the last 10 days, history of epilepsy and sleep apnea. EXAM: CT HEAD WITHOUT CONTRAST TECHNIQUE: Contiguous axial images were obtained from the base of the skull through the vertex without intravenous contrast. COMPARISON:  CTA head/neck 02/28/2019, head CT 02/28/2019, brain MRI 02/28/2019. FINDINGS: Brain: Mild generalized parenchymal atrophy. Mild patchy hypodensity within the cerebral white matter is nonspecific, but consistent with chronic small vessel ischemic disease. These findings appear stable as compared to the prior head CT of 02/28/2019. There is no acute intracranial hemorrhage. No demarcated cortical infarct is identified. No extra-axial fluid collection. No evidence of intracranial mass. No midline shift. Vascular: No hyperdense vessel.  Atherosclerotic calcifications Skull: Normal. Negative for fracture or focal lesion. Sinuses/Orbits: Visualized orbits show no acute finding. Mild ethmoid sinus mucosal thickening. No significant mastoid effusion IMPRESSION: No CT evidence of acute intracranial abnormality. Mild generalized parenchymal atrophy and chronic small vessel ischemic disease, stable as compared to the head CT of 02/28/2019. Mild ethmoid sinus mucosal thickening. Electronically Signed   By: Kellie Simmering DO   On: 05/30/2020 12:38   MR BRAIN W WO CONTRAST  Result Date: 06/02/2020 CLINICAL DATA:  Epilepsy and schizophrenia. Facial weakness and dysarthria. EXAM: MRI HEAD WITHOUT AND WITH CONTRAST TECHNIQUE: Multiplanar, multiecho pulse sequences of the brain and surrounding structures were obtained without and with intravenous contrast. CONTRAST:  7.72m GADAVIST GADOBUTROL 1 MMOL/ML IV SOLN COMPARISON:  Brain MRI 02/28/2019 FINDINGS: Brain: No acute infarct, acute hemorrhage or extra-axial collection. There is an area of encephalomalacia with surrounding gliosis in the medial right temporal lobe. There is generalized atrophy without lobar predilection. No chronic  microhemorrhage. Normal midline structures. Vascular: Normal flow voids. Skull and upper cervical spine: Normal marrow signal. Sinuses/Orbits: Negative. Other: None. IMPRESSION: 1. Volume loss of the right hippocampus with surrounding hyperintense T2-weighted signal, compatible with mesial temporal sclerosis. 2. No acute intracranial abnormality. Electronically Signed   By: KUlyses JarredM.D.   On: 06/02/2020 23:06   CT BIOPSY  Result Date: 06/01/2020 INDICATION: Pancytopenia EXAM: CT GUIDED BONE MARROW ASPIRATION AND CORE BIOPSY Interventional Radiologist:  HCriselda Peaches MD MEDICATIONS: None. ANESTHESIA/SEDATION: Moderate (conscious) sedation was employed during this procedure. A total of 1 milligrams versed and 50 micrograms fentanyl were administered intravenously. The patient's level of consciousness and vital signs were monitored continuously by radiology nursing throughout the procedure  under my direct supervision. Total monitored sedation time: 10 minutes FLUOROSCOPY TIME:  None. COMPLICATIONS: None immediate. Estimated blood loss: <25 mL PROCEDURE: Informed written consent was obtained from the patient after a thorough discussion of the procedural risks, benefits and alternatives. All questions were addressed. Maximal Sterile Barrier Technique was utilized including caps, mask, sterile gowns, sterile gloves, sterile drape, hand hygiene and skin antiseptic. A timeout was performed prior to the initiation of the procedure. The patient was positioned prone and non-contrast localization CT was performed of the pelvis to demonstrate the iliac marrow spaces. Maximal barrier sterile technique utilized including caps, mask, sterile gowns, sterile gloves, large sterile drape, hand hygiene, and betadine prep. Under sterile conditions and local anesthesia, an 11 gauge coaxial bone biopsy needle was advanced into the right iliac marrow space. Needle position was confirmed with CT imaging. Initially, bone  marrow aspiration was performed. Next, the 11 gauge outer cannula was utilized to obtain a right iliac bone marrow core biopsy. Needle was removed. Hemostasis was obtained with compression. The patient tolerated the procedure well. Samples were prepared with the cytotechnologist. IMPRESSION: Technically successful CT-guided bone marrow aspiration and core biopsy of the right iliac bone. Electronically Signed   By: Jacqulynn Cadet M.D.   On: 06/01/2020 12:56   CT BONE MARROW BIOPSY & ASPIRATION  Result Date: 06/01/2020 INDICATION: Pancytopenia EXAM: CT GUIDED BONE MARROW ASPIRATION AND CORE BIOPSY Interventional Radiologist:  Criselda Peaches, MD MEDICATIONS: None. ANESTHESIA/SEDATION: Moderate (conscious) sedation was employed during this procedure. A total of 1 milligrams versed and 50 micrograms fentanyl were administered intravenously. The patient's level of consciousness and vital signs were monitored continuously by radiology nursing throughout the procedure under my direct supervision. Total monitored sedation time: 10 minutes FLUOROSCOPY TIME:  None. COMPLICATIONS: None immediate. Estimated blood loss: <25 mL PROCEDURE: Informed written consent was obtained from the patient after a thorough discussion of the procedural risks, benefits and alternatives. All questions were addressed. Maximal Sterile Barrier Technique was utilized including caps, mask, sterile gowns, sterile gloves, sterile drape, hand hygiene and skin antiseptic. A timeout was performed prior to the initiation of the procedure. The patient was positioned prone and non-contrast localization CT was performed of the pelvis to demonstrate the iliac marrow spaces. Maximal barrier sterile technique utilized including caps, mask, sterile gowns, sterile gloves, large sterile drape, hand hygiene, and betadine prep. Under sterile conditions and local anesthesia, an 11 gauge coaxial bone biopsy needle was advanced into the right iliac marrow  space. Needle position was confirmed with CT imaging. Initially, bone marrow aspiration was performed. Next, the 11 gauge outer cannula was utilized to obtain a right iliac bone marrow core biopsy. Needle was removed. Hemostasis was obtained with compression. The patient tolerated the procedure well. Samples were prepared with the cytotechnologist. IMPRESSION: Technically successful CT-guided bone marrow aspiration and core biopsy of the right iliac bone. Electronically Signed   By: Jacqulynn Cadet M.D.   On: 06/01/2020 12:56   ECHOCARDIOGRAM COMPLETE  Result Date: 05/30/2020    ECHOCARDIOGRAM REPORT   Patient Name:   ARRIYANNA MERSCH [facility] Date of Exam: 05/30/2020 Medical Rec #:  [id number]        Height:       63.0 in Accession #:    [id number]       Weight:       155.0 lb Date of Birth:  1956/05/27       BSA:          1.735 m Patient Age:  63 years         BP:           114/64 mmHg Patient Gender: F                HR:           78 bpm. Exam Location:  Inpatient Procedure: 2D Echo Indications:    dyspnea 786.09  History:        Patient has no prior history of Echocardiogram examinations. No                 prior cardiac hx on file.  Sonographer:    Jannett Celestine RDCS (AE) Referring Phys: [place]  1. Left ventricular ejection fraction, by estimation, is 60 to 65%. The left ventricle has normal function. The left ventricle has no regional wall motion abnormalities. Left ventricular diastolic parameters were normal.  2. Right ventricular systolic function is normal. The right ventricular size is normal.  3. The mitral valve is normal in structure. Mild mitral valve regurgitation. No evidence of mitral stenosis.  4. The aortic valve is normal in structure. Aortic valve regurgitation is not visualized. No aortic stenosis is present.  5. The inferior vena cava is dilated in size with >50% respiratory variability, suggesting right atrial pressure of 8 mmHg. Comparison(s): No prior  Echocardiogram. Conclusion(s)/Recommendation(s): Flow velocities are increased across all valves consistent with high cardiac output. Consider sepsis, thyrotoxicosis, anemia, etc. FINDINGS  Left Ventricle: Left ventricular ejection fraction, by estimation, is 60 to 65%. The left ventricle has normal function. The left ventricle has no regional wall motion abnormalities. The left ventricular internal cavity size was normal in size. There is  no left ventricular hypertrophy. Left ventricular diastolic parameters were normal. Indeterminate filling pressures. Right Ventricle: The right ventricular size is normal. No increase in right ventricular wall thickness. Right ventricular systolic function is normal. Left Atrium: Left atrial size was normal in size. Right Atrium: Right atrial size was normal in size. Pericardium: Trivial pericardial effusion is present. The pericardial effusion is circumferential. Mitral Valve: The mitral valve is normal in structure. Normal mobility of the mitral valve leaflets. Mild mitral valve regurgitation, with posteriorly-directed jet. No evidence of mitral valve stenosis. Tricuspid Valve: The tricuspid valve is normal in structure. Tricuspid valve regurgitation is not demonstrated. No evidence of tricuspid stenosis. Aortic Valve: The aortic valve is normal in structure. Aortic valve regurgitation is not visualized. No aortic stenosis is present. Aortic valve mean gradient measures 10.0 mmHg. Aortic valve peak gradient measures 18.8 mmHg. Aortic valve area, by VTI measures 2.05 cm. Pulmonic Valve: The pulmonic valve was normal in structure. Pulmonic valve regurgitation is not visualized. No evidence of pulmonic stenosis. Aorta: The aortic root is normal in size and structure. Venous: The inferior vena cava is dilated in size with greater than 50% respiratory variability, suggesting right atrial pressure of 8 mmHg. IAS/Shunts: No atrial level shunt detected by color flow Doppler.  LEFT  VENTRICLE PLAX 2D LVIDd:         4.80 cm  Diastology LVIDs:         3.20 cm  LV e' lateral:   9.68 cm/s LV PW:         1.10 cm  LV E/e' lateral: 12.6 LV IVS:        1.00 cm  LV e' medial:    8.00 cm/s LVOT diam:     1.90 cm  LV E/e' medial:  15.2 LV SV:  90 LV SV Index:   52 LVOT Area:     2.84 cm  RIGHT VENTRICLE RV Basal diam:  3.83 cm RV S prime:     15.30 cm/s TAPSE (M-mode): 3.3 cm LEFT ATRIUM             Index       RIGHT ATRIUM           Index LA diam:        3.50 cm 2.02 cm/m  RA Area:     18.30 cm LA Vol (A2C):   67.3 ml 38.79 ml/m RA Volume:   50.40 ml  29.05 ml/m LA Vol (A4C):   36.2 ml 20.86 ml/m LA Biplane Vol: 53.8 ml 31.01 ml/m  AORTIC VALVE AV Area (Vmax):    2.17 cm AV Area (Vmean):   1.98 cm AV Area (VTI):     2.05 cm AV Vmax:           217.00 cm/s AV Vmean:          150.000 cm/s AV VTI:            0.439 m AV Peak Grad:      18.8 mmHg AV Mean Grad:      10.0 mmHg LVOT Vmax:         166.00 cm/s LVOT Vmean:        105.000 cm/s LVOT VTI:          0.318 m LVOT/AV VTI ratio: 0.72  AORTA Ao Root diam: 2.90 cm MITRAL VALVE MV Area (PHT): 3.77 cm     SHUNTS MV Decel Time: 201 msec     Systemic VTI:  0.32 m MV E velocity: 122.00 cm/s  Systemic Diam: 1.90 cm MV A velocity: 88.30 cm/s MV E/A ratio:  1.38 Mihai Croitoru MD Electronically signed by Sanda Klein MD Signature Date/Time: 05/30/2020/9:08:12 AM    Final    US SPLEEN (ABDOMEN LIMITED)  Result Date: 05/30/2020 CLINICAL DATA:  64 year old female with splenomegaly. EXAM: ULTRASOUND ABDOMEN LIMITED COMPARISON:  None. FINDINGS: Targeted sonographic images of the left upper abdomen performed for assessment of the spleen. The spleen measures 11.8 x 4.9 x 11.6 cm for a volume of 350 cc. Partially visualized left pleural effusion. IMPRESSION: 1. No splenomegaly. 2. Left pleural effusion. Electronically Signed   By: Anner Crete M.D.   On: 05/30/2020 18:46   VAS [place] LOWER EXTREMITY VENOUS (DVT)  Result Date: 05/30/2020  Lower Venous  DVTStudy Indications: Edema, and congestive heart failure.  Limitations: Poor ultrasound/tissue interface. Comparison Study: No prior study Performing Technologist: Maudry Mayhew MHA, RDMS, RVT, RDCS  Examination Guidelines: A complete evaluation includes B-mode imaging, spectral Doppler, color Doppler, and power Doppler as needed of all accessible portions of each vessel. Bilateral testing is considered an integral part of a complete examination. Limited examinations for reoccurring indications may be performed as noted. The reflux portion of the exam is performed with the patient in reverse Trendelenburg.  +---------+---------------+---------+-----------+----------+--------------+ RIGHT    CompressibilityPhasicitySpontaneityPropertiesThrombus Aging +---------+---------------+---------+-----------+----------+--------------+ CFV      Full           No       Yes                                 +---------+---------------+---------+-----------+----------+--------------+ SFJ      Full                                                        +---------+---------------+---------+-----------+----------+--------------+  FV Prox  Full                                                        +---------+---------------+---------+-----------+----------+--------------+ FV Mid   Full                                                        +---------+---------------+---------+-----------+----------+--------------+ FV DistalFull                                                        +---------+---------------+---------+-----------+----------+--------------+ PFV      Full                                                        +---------+---------------+---------+-----------+----------+--------------+ POP      Full           No       Yes                                 +---------+---------------+---------+-----------+----------+--------------+ PTV      Full                                                         +---------+---------------+---------+-----------+----------+--------------+ PERO     Full                                                        +---------+---------------+---------+-----------+----------+--------------+   Right Technical Findings: Not visualized segments include Limited evaluation right PTV, peroneal veins.  +---------+---------------+---------+-----------+----------+--------------+ LEFT     CompressibilityPhasicitySpontaneityPropertiesThrombus Aging +---------+---------------+---------+-----------+----------+--------------+ CFV      Full           No       Yes                                 +---------+---------------+---------+-----------+----------+--------------+ SFJ      Full                                                        +---------+---------------+---------+-----------+----------+--------------+ FV Prox  Full                                                        +---------+---------------+---------+-----------+----------+--------------+  FV Mid   Full                                                        +---------+---------------+---------+-----------+----------+--------------+ FV DistalFull                                                        +---------+---------------+---------+-----------+----------+--------------+ PFV      Full                                                        +---------+---------------+---------+-----------+----------+--------------+ POP      Full           No       Yes                                 +---------+---------------+---------+-----------+----------+--------------+ PTV      Full                                                        +---------+---------------+---------+-----------+----------+--------------+ PERO     Full                                                         +---------+---------------+---------+-----------+----------+--------------+   Left Technical Findings: Not visualized segments include Limited evaluation left PTV, peroneal veins.   Summary: RIGHT: - There is no evidence of deep vein thrombosis in the lower extremity. However, portions of this examination were limited- see technologist comments above.  - No cystic structure found in the popliteal fossa.  LEFT: - There is no evidence of deep vein thrombosis in the lower extremity. However, portions of this examination were limited- see technologist comments above.  - A complex cystic structure is found in the popliteal fossa.  *See table(s) above for measurements and observations. Electronically signed by Deitra Mayo MD on 05/30/2020 at 8:09:22 PM.    Final

## 2020-06-09 NOTE — Assessment & Plan Note (Addendum)
Preliminary bone marrow results suggest myelodysplastic syndrome We have ordered extensive panel of blood work that excluded nutritional deficiency in the hospital I will add copper level and serum erythropoietin level to her next blood draw Hopefully, next generation sequencing panel/FISH results will be available by next week If confirmed the diagnosis of myelodysplastic syndrome, her calculated revised IPSS score right now is at the low risk category She will likely need intermittent blood transfusions in the future Depending on future EPO level, she might be a candidate for darbepoetin injections We discussed the general approach to myelodysplastic syndrome At this point in time, she does not need chemotherapy I will see her next week again and follow her blood counts closely in case she need transfusion support again in the future

## 2020-06-09 NOTE — Assessment & Plan Note (Signed)
She is very debilitated and weak She is undergoing home physical therapy She had one recent fall but no injury

## 2020-06-10 DIAGNOSIS — D61818 Other pancytopenia: Secondary | ICD-10-CM | POA: Diagnosis not present

## 2020-06-10 DIAGNOSIS — D709 Neutropenia, unspecified: Secondary | ICD-10-CM | POA: Diagnosis not present

## 2020-06-10 DIAGNOSIS — D72819 Decreased white blood cell count, unspecified: Secondary | ICD-10-CM | POA: Diagnosis not present

## 2020-06-10 DIAGNOSIS — I313 Pericardial effusion (noninflammatory): Secondary | ICD-10-CM | POA: Diagnosis not present

## 2020-06-10 DIAGNOSIS — I119 Hypertensive heart disease without heart failure: Secondary | ICD-10-CM | POA: Diagnosis not present

## 2020-06-10 DIAGNOSIS — R600 Localized edema: Secondary | ICD-10-CM | POA: Diagnosis not present

## 2020-06-12 DIAGNOSIS — R600 Localized edema: Secondary | ICD-10-CM | POA: Diagnosis not present

## 2020-06-12 DIAGNOSIS — I119 Hypertensive heart disease without heart failure: Secondary | ICD-10-CM | POA: Diagnosis not present

## 2020-06-12 DIAGNOSIS — I313 Pericardial effusion (noninflammatory): Secondary | ICD-10-CM | POA: Diagnosis not present

## 2020-06-12 DIAGNOSIS — D72819 Decreased white blood cell count, unspecified: Secondary | ICD-10-CM | POA: Diagnosis not present

## 2020-06-12 DIAGNOSIS — D61818 Other pancytopenia: Secondary | ICD-10-CM | POA: Diagnosis not present

## 2020-06-12 DIAGNOSIS — D709 Neutropenia, unspecified: Secondary | ICD-10-CM | POA: Diagnosis not present

## 2020-06-13 ENCOUNTER — Encounter (HOSPITAL_COMMUNITY): Payer: Self-pay | Admitting: Hematology and Oncology

## 2020-06-13 DIAGNOSIS — G40909 Epilepsy, unspecified, not intractable, without status epilepticus: Secondary | ICD-10-CM | POA: Diagnosis not present

## 2020-06-13 DIAGNOSIS — G40109 Localization-related (focal) (partial) symptomatic epilepsy and epileptic syndromes with simple partial seizures, not intractable, without status epilepticus: Secondary | ICD-10-CM | POA: Diagnosis not present

## 2020-06-13 DIAGNOSIS — F28 Other psychotic disorder not due to a substance or known physiological condition: Secondary | ICD-10-CM | POA: Diagnosis not present

## 2020-06-13 DIAGNOSIS — D61818 Other pancytopenia: Secondary | ICD-10-CM | POA: Diagnosis not present

## 2020-06-13 DIAGNOSIS — R351 Nocturia: Secondary | ICD-10-CM | POA: Diagnosis not present

## 2020-06-13 DIAGNOSIS — Z9989 Dependence on other enabling machines and devices: Secondary | ICD-10-CM | POA: Diagnosis not present

## 2020-06-13 DIAGNOSIS — R609 Edema, unspecified: Secondary | ICD-10-CM | POA: Diagnosis not present

## 2020-06-13 DIAGNOSIS — G4733 Obstructive sleep apnea (adult) (pediatric): Secondary | ICD-10-CM | POA: Diagnosis not present

## 2020-06-14 ENCOUNTER — Other Ambulatory Visit (HOSPITAL_COMMUNITY): Payer: Medicare Other

## 2020-06-14 DIAGNOSIS — D709 Neutropenia, unspecified: Secondary | ICD-10-CM | POA: Diagnosis not present

## 2020-06-14 DIAGNOSIS — D72819 Decreased white blood cell count, unspecified: Secondary | ICD-10-CM | POA: Diagnosis not present

## 2020-06-14 DIAGNOSIS — D61818 Other pancytopenia: Secondary | ICD-10-CM | POA: Diagnosis not present

## 2020-06-14 DIAGNOSIS — R600 Localized edema: Secondary | ICD-10-CM | POA: Diagnosis not present

## 2020-06-14 DIAGNOSIS — I313 Pericardial effusion (noninflammatory): Secondary | ICD-10-CM | POA: Diagnosis not present

## 2020-06-14 DIAGNOSIS — I119 Hypertensive heart disease without heart failure: Secondary | ICD-10-CM | POA: Diagnosis not present

## 2020-06-15 ENCOUNTER — Inpatient Hospital Stay: Payer: Medicare Other

## 2020-06-15 ENCOUNTER — Other Ambulatory Visit: Payer: Self-pay

## 2020-06-15 ENCOUNTER — Ambulatory Visit (HOSPITAL_COMMUNITY)
Admission: RE | Admit: 2020-06-15 | Discharge: 2020-06-15 | Disposition: A | Discharge status: Home / Self Care | Payer: Medicare Other | Source: Ambulatory Visit | Attending: Internal Medicine | Admitting: Internal Medicine

## 2020-06-15 ENCOUNTER — Inpatient Hospital Stay (HOSPITAL_BASED_OUTPATIENT_CLINIC_OR_DEPARTMENT_OTHER): Payer: Medicare Other | Admitting: Hematology and Oncology

## 2020-06-15 ENCOUNTER — Encounter: Payer: Self-pay | Admitting: Hematology and Oncology

## 2020-06-15 DIAGNOSIS — R350 Frequency of micturition: Secondary | ICD-10-CM | POA: Diagnosis not present

## 2020-06-15 DIAGNOSIS — R531 Weakness: Secondary | ICD-10-CM | POA: Diagnosis not present

## 2020-06-15 DIAGNOSIS — D61818 Other pancytopenia: Secondary | ICD-10-CM

## 2020-06-15 DIAGNOSIS — Z79899 Other long term (current) drug therapy: Secondary | ICD-10-CM | POA: Diagnosis not present

## 2020-06-15 DIAGNOSIS — G40909 Epilepsy, unspecified, not intractable, without status epilepticus: Secondary | ICD-10-CM | POA: Diagnosis not present

## 2020-06-15 DIAGNOSIS — D469 Myelodysplastic syndrome, unspecified: Secondary | ICD-10-CM

## 2020-06-15 DIAGNOSIS — R4182 Altered mental status, unspecified: Secondary | ICD-10-CM | POA: Diagnosis not present

## 2020-06-15 DIAGNOSIS — R5381 Other malaise: Secondary | ICD-10-CM | POA: Diagnosis not present

## 2020-06-15 DIAGNOSIS — R0989 Other specified symptoms and signs involving the circulatory and respiratory systems: Secondary | ICD-10-CM

## 2020-06-15 DIAGNOSIS — D649 Anemia, unspecified: Secondary | ICD-10-CM

## 2020-06-15 DIAGNOSIS — E538 Deficiency of other specified B group vitamins: Secondary | ICD-10-CM | POA: Diagnosis not present

## 2020-06-15 LAB — CBC WITH DIFFERENTIAL/PLATELET
Abs Immature Granulocytes: 0 10*3/uL (ref 0.00–0.07)
Basophils Absolute: 0 10*3/uL (ref 0.0–0.1)
Basophils Relative: 1 %
Eosinophils Absolute: 0.1 10*3/uL (ref 0.0–0.5)
Eosinophils Relative: 4 %
HCT: 34.8 % — ABNORMAL LOW (ref 36.0–46.0)
Hemoglobin: 10.9 g/dL — ABNORMAL LOW (ref 12.0–15.0)
Immature Granulocytes: 0 %
Lymphocytes Relative: 26 %
Lymphs Abs: 0.6 10*3/uL — ABNORMAL LOW (ref 0.7–4.0)
MCH: 31.9 pg (ref 26.0–34.0)
MCHC: 31.3 g/dL (ref 30.0–36.0)
MCV: 101.8 fL — ABNORMAL HIGH (ref 80.0–100.0)
Monocytes Absolute: 0.7 10*3/uL (ref 0.1–1.0)
Monocytes Relative: 32 %
Neutro Abs: 0.9 10*3/uL — ABNORMAL LOW (ref 1.7–7.7)
Neutrophils Relative %: 37 %
Platelets: 230 10*3/uL (ref 150–400)
RBC: 3.42 MIL/uL — ABNORMAL LOW (ref 3.87–5.11)
RDW: 18.3 % — ABNORMAL HIGH (ref 11.5–15.5)
WBC: 2.3 10*3/uL — ABNORMAL LOW (ref 4.0–10.5)
nRBC: 0 % (ref 0.0–0.2)

## 2020-06-15 LAB — SAMPLE TO BLOOD BANK

## 2020-06-15 NOTE — Assessment & Plan Note (Addendum)
She does not need transfusion support I do not recommend the patient to take any other supplement than prescribed medicines We discussed neutropenic precaution

## 2020-06-15 NOTE — Assessment & Plan Note (Signed)
My original assessment was the patient have myelodysplastic syndrome However, the FISH panel for next generation sequencing as well as cytogenetics were all within normal limits Her blood counts continue to improve despite no further treatment or transfusion support This led to my believe that her pancytopenia may not be caused by myelodysplastic syndrome The patient's husband and family suspected all along that the patient accidentally took excessive amount of medications of unknown type that could have caused accidental poisoning which led to severe pancytopenia in her recent hospitalization, which I think could be possible For now, I am hopeful that her blood counts will continue to improve I plan to see her back in 2 weeks for further follow-up She does not need transfusion support She will continue neutropenic precautions

## 2020-06-15 NOTE — Progress Notes (Signed)
Andrea Crawford OFFICE PROGRESS NOTE  Patient Care Team: Andrea Fess, MD as PCP - General (Family Medicine)  ASSESSMENT & PLAN:  MDS (myelodysplastic syndrome) [facility]) My original assessment was the patient have myelodysplastic syndrome However, the FISH panel for next generation sequencing as well as cytogenetics were all within normal limits Her blood counts continue to improve despite no further treatment or transfusion support This led to my believe that her pancytopenia may not be caused by myelodysplastic syndrome The patient's husband and family suspected all along that the patient accidentally took excessive amount of medications of unknown type that could have caused accidental poisoning which led to severe pancytopenia in her recent hospitalization, which I think could be possible For now, I am hopeful that her blood counts will continue to improve I plan to see her back in 2 weeks for further follow-up She does not need transfusion support She will continue neutropenic precautions  Pancytopenia, acquired ([place]) She does not need transfusion support I do not recommend the patient to take any other supplement than prescribed medicines We discussed neutropenic precaution   No orders of the defined types were placed in this encounter.   All questions were answered. The patient knows to call the clinic with any problems, questions or concerns. The total time spent in the appointment was 20 minutes encounter with patients including review of chart and various tests results, discussions about plan of care and coordination of care plan   Andrea Lark, MD 06/15/2020 1:31 PM  INTERVAL HISTORY: Please see below for problem oriented charting. She returns with her husband for further follow-up No recent infection, fever or chills The patient denies any recent signs or symptoms of bleeding such as spontaneous epistaxis, hematuria or hematochezia.  SUMMARY OF ONCOLOGIC  HISTORY: Oncology History Overview Note  Cytogenetics from bone marrow biopsy from 06/01/2020 is normal karyotype, NGS FISH panel is normal Calculated R-IPSS score is low risk   MDS (myelodysplastic syndrome) ([place])  05/29/2020 - 06/05/2020 Hospital Admission   She was hospitalized for severe pancytopenia requiring multiple units of blood transfusions along with bone marrow aspirate and biopsy   05/30/2020 Initial Diagnosis   HPI: Andrea Crawford is a 64 year old female with a past medical history significant for epilepsy, schizophrenia, and sleep apnea.  The patient presented to the emergency room due to abnormal lab work performed by her primary care provider.  CBC on admission showed a WBC of 1.4, hemoglobin 4.7, hematocrit 15.6, and platelet count of 191,000.  Chemistry significant for a BUN of 28, creatinine 1.2, calcium 8.3, and total protein of 6.1.  Absolute reticulocytes were decreased at 17.0, vitamin B12 level normal at 197, folate normal at 12.6, ferritin normal at 169 with a normal iron, TIBC, percent saturation, and UIBC.  The patient received multiple units of blood transfusion and was subsequently admitted to the hospital for further management   06/01/2020 Pathology Results   BONE MARROW, ASPIRATE, CLOT, CORE:  -  Hypercellular marrow with dyspoieisis and ringed sideroblasts  -  See comment and microscopic description below   PERIPHERAL BLOOD:  -  Pancytopenia  -  See complete blood cell count below   COMMENT:   Overall, the findings are concerning for a myelodysplastic syndrome especially myelodysplasia with ringed sideroblasts (MDS-RS).  However, non-clonal causes of dysplasia must be excluded before the diagnosis of myelodysplasia is established. These include, but are not limited to, drug and toxin exposure, growth factor therapy, viral infections, immunologic disorders, and nutritional deficiencies (e.g.,  B12, copper, etc.).  Correlation with cytogenetics is recommended.  NGS panel  (myeloid neoplasm) may be of interest.      REVIEW OF SYSTEMS:   Constitutional: Denies fevers, chills or abnormal weight loss Eyes: Denies blurriness of vision Ears, nose, mouth, throat, and face: Denies mucositis or sore throat Respiratory: Denies cough, dyspnea or wheezes Cardiovascular: Denies palpitation, chest discomfort or lower extremity swelling Gastrointestinal:  Denies nausea, heartburn or change in bowel habits Skin: Denies abnormal skin rashes Lymphatics: Denies new lymphadenopathy or easy bruising Neurological:Denies numbness, tingling or new weaknesses Behavioral/Psych: Mood is stable, no new changes  All other systems were reviewed with the patient and are negative.  I have reviewed the past medical history, past surgical history, social history and family history with the patient and they are unchanged from previous note.  ALLERGIES:  has No Known Allergies.  MEDICATIONS:  Current Outpatient Medications  Medication Sig Dispense Refill  . acetaminophen (TYLENOL) 500 MG tablet Take 1,000 mg by mouth every 6 (six) hours as needed for headache (pain).    Marland Kitchen amantadine (SYMMETREL) 100 MG capsule Take 100 mg by mouth 2 (two) times daily.     . ARIPiprazole (ABILIFY) 15 MG tablet Take 1 tablet (15 mg total) by mouth at bedtime.    . Ascorbic Acid (VITAMIN C) 1000 MG tablet Take 1,000 mg by mouth 2 (two) times a day.    . cholecalciferol (VITAMIN D3) 25 MCG (1000 UT) tablet Take 1,000 Units by mouth daily as needed (sunshine replacement).    . cloBAZam (ONFI) 10 MG tablet Take 5-15 mg by mouth See admin instructions. 0.5 tablet (5 MG) in the morning and 1.5 tablets (15 MG) in the evening    . lacosamide (VIMPAT) 50 MG TABS tablet Take 50-150 mg by mouth See admin instructions. Take 1 tablet (50 mg) by mouth with breakfast and lunch, take 3 tablets (150 mg) at bedtime    . lamoTRIgine (LAMICTAL) 200 MG tablet Take 200-400 mg by mouth See admin instructions. Take one tablet (200  mg) by mouth with breakfast and lunch, take two tablets (400 mg) at bedtime    . levETIRAcetam (KEPPRA) 1000 MG tablet Take 500-1,000 mg by mouth See admin instructions. Take 1/2 tablet (500 mg) by mouth with breakfast and lunch, take 1 tablet (1000 mg) at bedtime    . omeprazole (PRILOSEC) 40 MG capsule Take 40 mg by mouth at bedtime.    Marland Kitchen PRESCRIPTION MEDICATION Inhale into the lungs at bedtime. CPAP    . pyridOXINE (VITAMIN B-6) 100 MG tablet Take 600 mg by mouth daily.    . vitamin B-12 (CYANOCOBALAMIN) 1000 MCG tablet Take 1 tablet (1,000 mcg total) by mouth daily.    Marland Kitchen zinc gluconate 50 MG tablet Take 200 mg by mouth in the morning, at noon, in the evening, and at bedtime.     No current facility-administered medications for this visit.    PHYSICAL EXAMINATION: ECOG PERFORMANCE STATUS: 1 - Symptomatic but completely ambulatory  Vitals:   06/15/20 1000  BP: 117/62  Pulse: 73  Resp: 19  Temp: (!) 96.5 F (35.8 C)  SpO2: 100%   Filed Weights   06/15/20 1000  Weight: 138 lb (62.6 kg)    GENERAL:alert, no distress and comfortable NEURO: alert & oriented x 3 with occasional slurred speech, no focal motor/sensory deficits  LABORATORY DATA:  I have reviewed the data as listed    Component Value Date/Time   NA 130 (L) 06/05/2020 3559  K 3.4 (L) 06/05/2020 0822   CL 91 (L) 06/05/2020 0822   CO2 28 06/05/2020 0822   GLUCOSE 107 (H) 06/05/2020 0822   BUN 18 06/05/2020 0822   CREATININE 0.69 06/05/2020 0822   CALCIUM 8.2 (L) 06/05/2020 0822   PROT 6.1 (L) 06/01/2020 0950   ALBUMIN 3.7 06/01/2020 0950   AST 23 06/01/2020 0950   ALT 35 06/01/2020 0950   ALKPHOS 103 06/01/2020 0950   BILITOT 1.2 06/01/2020 0950   GFRNONAA >60 06/05/2020 0822   GFRAA >60 06/05/2020 0822    No results found for: SPEP, UPEP  Lab Results  Component Value Date   WBC 2.3 (L) 06/15/2020   NEUTROABS 0.9 (L) 06/15/2020   HGB 10.9 (L) 06/15/2020   HCT 34.8 (L) 06/15/2020   MCV 101.8 (H)  06/15/2020   PLT 230 06/15/2020      Chemistry      Component Value Date/Time   NA 130 (L) 06/05/2020 0822   K 3.4 (L) 06/05/2020 0822   CL 91 (L) 06/05/2020 0822   CO2 28 06/05/2020 0822   BUN 18 06/05/2020 0822   CREATININE 0.69 06/05/2020 0822      Component Value Date/Time   CALCIUM 8.2 (L) 06/05/2020 0822   ALKPHOS 103 06/01/2020 0950   AST 23 06/01/2020 0950   ALT 35 06/01/2020 0950   BILITOT 1.2 06/01/2020 0950

## 2020-06-16 DIAGNOSIS — D709 Neutropenia, unspecified: Secondary | ICD-10-CM | POA: Diagnosis not present

## 2020-06-16 DIAGNOSIS — R600 Localized edema: Secondary | ICD-10-CM | POA: Diagnosis not present

## 2020-06-16 DIAGNOSIS — I119 Hypertensive heart disease without heart failure: Secondary | ICD-10-CM | POA: Diagnosis not present

## 2020-06-16 DIAGNOSIS — I313 Pericardial effusion (noninflammatory): Secondary | ICD-10-CM | POA: Diagnosis not present

## 2020-06-16 DIAGNOSIS — D61818 Other pancytopenia: Secondary | ICD-10-CM | POA: Diagnosis not present

## 2020-06-16 DIAGNOSIS — D72819 Decreased white blood cell count, unspecified: Secondary | ICD-10-CM | POA: Diagnosis not present

## 2020-06-16 LAB — ERYTHROPOIETIN: Erythropoietin: 57 m[IU]/mL — ABNORMAL HIGH (ref 2.6–18.5)

## 2020-06-18 ENCOUNTER — Encounter: Payer: Self-pay | Admitting: Hematology and Oncology

## 2020-06-18 LAB — COPPER, SERUM: Copper: 10 ug/dL — ABNORMAL LOW (ref 80–158)

## 2020-06-19 ENCOUNTER — Telehealth: Payer: Self-pay

## 2020-06-19 DIAGNOSIS — I119 Hypertensive heart disease without heart failure: Secondary | ICD-10-CM | POA: Diagnosis not present

## 2020-06-19 DIAGNOSIS — I313 Pericardial effusion (noninflammatory): Secondary | ICD-10-CM | POA: Diagnosis not present

## 2020-06-19 DIAGNOSIS — D72819 Decreased white blood cell count, unspecified: Secondary | ICD-10-CM | POA: Diagnosis not present

## 2020-06-19 DIAGNOSIS — D61818 Other pancytopenia: Secondary | ICD-10-CM | POA: Diagnosis not present

## 2020-06-19 DIAGNOSIS — D709 Neutropenia, unspecified: Secondary | ICD-10-CM | POA: Diagnosis not present

## 2020-06-19 DIAGNOSIS — R600 Localized edema: Secondary | ICD-10-CM | POA: Diagnosis not present

## 2020-06-19 NOTE — Telephone Encounter (Signed)
Called and given below message. He verbalized understanding. 

## 2020-06-19 NOTE — Telephone Encounter (Signed)
-----   Message from Heath Lark, MD sent at 06/19/2020  8:02 AM EDT ----- Regarding: low copper level Tell her husband her copper level is low I suggest over the counter 4 mg copper supplement. No prescriptions are available [facility] or health food store will have it Start daily, stop zinc supplement. Zinc can interfere will copper absorption

## 2020-06-20 DIAGNOSIS — I119 Hypertensive heart disease without heart failure: Secondary | ICD-10-CM | POA: Diagnosis not present

## 2020-06-20 DIAGNOSIS — D709 Neutropenia, unspecified: Secondary | ICD-10-CM | POA: Diagnosis not present

## 2020-06-20 DIAGNOSIS — D72819 Decreased white blood cell count, unspecified: Secondary | ICD-10-CM | POA: Diagnosis not present

## 2020-06-20 DIAGNOSIS — I313 Pericardial effusion (noninflammatory): Secondary | ICD-10-CM | POA: Diagnosis not present

## 2020-06-20 DIAGNOSIS — R600 Localized edema: Secondary | ICD-10-CM | POA: Diagnosis not present

## 2020-06-20 DIAGNOSIS — D61818 Other pancytopenia: Secondary | ICD-10-CM | POA: Diagnosis not present

## 2020-06-21 DIAGNOSIS — R600 Localized edema: Secondary | ICD-10-CM | POA: Diagnosis not present

## 2020-06-21 DIAGNOSIS — I119 Hypertensive heart disease without heart failure: Secondary | ICD-10-CM | POA: Diagnosis not present

## 2020-06-21 DIAGNOSIS — D61818 Other pancytopenia: Secondary | ICD-10-CM | POA: Diagnosis not present

## 2020-06-21 DIAGNOSIS — D709 Neutropenia, unspecified: Secondary | ICD-10-CM | POA: Diagnosis not present

## 2020-06-21 DIAGNOSIS — I313 Pericardial effusion (noninflammatory): Secondary | ICD-10-CM | POA: Diagnosis not present

## 2020-06-21 DIAGNOSIS — D72819 Decreased white blood cell count, unspecified: Secondary | ICD-10-CM | POA: Diagnosis not present

## 2020-06-22 DIAGNOSIS — G40909 Epilepsy, unspecified, not intractable, without status epilepticus: Secondary | ICD-10-CM | POA: Diagnosis not present

## 2020-06-22 DIAGNOSIS — D61818 Other pancytopenia: Secondary | ICD-10-CM | POA: Diagnosis not present

## 2020-06-22 DIAGNOSIS — I051 Rheumatic mitral insufficiency: Secondary | ICD-10-CM | POA: Diagnosis not present

## 2020-06-22 DIAGNOSIS — R4781 Slurred speech: Secondary | ICD-10-CM | POA: Diagnosis not present

## 2020-06-22 DIAGNOSIS — D72819 Decreased white blood cell count, unspecified: Secondary | ICD-10-CM | POA: Diagnosis not present

## 2020-06-22 DIAGNOSIS — J90 Pleural effusion, not elsewhere classified: Secondary | ICD-10-CM | POA: Diagnosis not present

## 2020-06-22 DIAGNOSIS — I119 Hypertensive heart disease without heart failure: Secondary | ICD-10-CM | POA: Diagnosis not present

## 2020-06-22 DIAGNOSIS — I313 Pericardial effusion (noninflammatory): Secondary | ICD-10-CM | POA: Diagnosis not present

## 2020-06-22 DIAGNOSIS — N179 Acute kidney failure, unspecified: Secondary | ICD-10-CM | POA: Diagnosis not present

## 2020-06-22 DIAGNOSIS — R410 Disorientation, unspecified: Secondary | ICD-10-CM | POA: Diagnosis not present

## 2020-06-22 DIAGNOSIS — R600 Localized edema: Secondary | ICD-10-CM | POA: Diagnosis not present

## 2020-06-22 DIAGNOSIS — D709 Neutropenia, unspecified: Secondary | ICD-10-CM | POA: Diagnosis not present

## 2020-06-23 DIAGNOSIS — R600 Localized edema: Secondary | ICD-10-CM | POA: Diagnosis not present

## 2020-06-23 DIAGNOSIS — D72819 Decreased white blood cell count, unspecified: Secondary | ICD-10-CM | POA: Diagnosis not present

## 2020-06-23 DIAGNOSIS — I119 Hypertensive heart disease without heart failure: Secondary | ICD-10-CM | POA: Diagnosis not present

## 2020-06-23 DIAGNOSIS — D709 Neutropenia, unspecified: Secondary | ICD-10-CM | POA: Diagnosis not present

## 2020-06-23 DIAGNOSIS — D61818 Other pancytopenia: Secondary | ICD-10-CM | POA: Diagnosis not present

## 2020-06-23 DIAGNOSIS — I313 Pericardial effusion (noninflammatory): Secondary | ICD-10-CM | POA: Diagnosis not present

## 2020-06-26 DIAGNOSIS — I119 Hypertensive heart disease without heart failure: Secondary | ICD-10-CM | POA: Diagnosis not present

## 2020-06-26 DIAGNOSIS — I313 Pericardial effusion (noninflammatory): Secondary | ICD-10-CM | POA: Diagnosis not present

## 2020-06-26 DIAGNOSIS — D72819 Decreased white blood cell count, unspecified: Secondary | ICD-10-CM | POA: Diagnosis not present

## 2020-06-26 DIAGNOSIS — R600 Localized edema: Secondary | ICD-10-CM | POA: Diagnosis not present

## 2020-06-26 DIAGNOSIS — D61818 Other pancytopenia: Secondary | ICD-10-CM | POA: Diagnosis not present

## 2020-06-26 DIAGNOSIS — D709 Neutropenia, unspecified: Secondary | ICD-10-CM | POA: Diagnosis not present

## 2020-06-27 DIAGNOSIS — I313 Pericardial effusion (noninflammatory): Secondary | ICD-10-CM | POA: Diagnosis not present

## 2020-06-27 DIAGNOSIS — D709 Neutropenia, unspecified: Secondary | ICD-10-CM | POA: Diagnosis not present

## 2020-06-27 DIAGNOSIS — R600 Localized edema: Secondary | ICD-10-CM | POA: Diagnosis not present

## 2020-06-27 DIAGNOSIS — D61818 Other pancytopenia: Secondary | ICD-10-CM | POA: Diagnosis not present

## 2020-06-27 DIAGNOSIS — D72819 Decreased white blood cell count, unspecified: Secondary | ICD-10-CM | POA: Diagnosis not present

## 2020-06-27 DIAGNOSIS — I119 Hypertensive heart disease without heart failure: Secondary | ICD-10-CM | POA: Diagnosis not present

## 2020-06-28 ENCOUNTER — Encounter: Payer: Self-pay | Admitting: Hematology and Oncology

## 2020-06-28 ENCOUNTER — Inpatient Hospital Stay: Payer: Medicare Other

## 2020-06-28 ENCOUNTER — Inpatient Hospital Stay: Payer: Medicare Other | Attending: Hematology and Oncology | Admitting: Hematology and Oncology

## 2020-06-28 ENCOUNTER — Other Ambulatory Visit: Payer: Self-pay

## 2020-06-28 VITALS — BP 112/72 | HR 68 | Temp 97.5°F | Resp 20 | Ht 63.0 in | Wt 129.0 lb

## 2020-06-28 DIAGNOSIS — D61818 Other pancytopenia: Secondary | ICD-10-CM

## 2020-06-28 DIAGNOSIS — F209 Schizophrenia, unspecified: Secondary | ICD-10-CM | POA: Insufficient documentation

## 2020-06-28 DIAGNOSIS — G40909 Epilepsy, unspecified, not intractable, without status epilepticus: Secondary | ICD-10-CM | POA: Insufficient documentation

## 2020-06-28 DIAGNOSIS — Z79899 Other long term (current) drug therapy: Secondary | ICD-10-CM | POA: Insufficient documentation

## 2020-06-28 DIAGNOSIS — E610 Copper deficiency: Secondary | ICD-10-CM | POA: Diagnosis not present

## 2020-06-28 LAB — CBC WITH DIFFERENTIAL/PLATELET
Abs Immature Granulocytes: 0.01 10*3/uL (ref 0.00–0.07)
Basophils Absolute: 0 10*3/uL (ref 0.0–0.1)
Basophils Relative: 1 %
Eosinophils Absolute: 0.3 10*3/uL (ref 0.0–0.5)
Eosinophils Relative: 7 %
HCT: 37.3 % (ref 36.0–46.0)
Hemoglobin: 11.4 g/dL — ABNORMAL LOW (ref 12.0–15.0)
Immature Granulocytes: 0 %
Lymphocytes Relative: 32 %
Lymphs Abs: 1.3 10*3/uL (ref 0.7–4.0)
MCH: 31.1 pg (ref 26.0–34.0)
MCHC: 30.6 g/dL (ref 30.0–36.0)
MCV: 101.9 fL — ABNORMAL HIGH (ref 80.0–100.0)
Monocytes Absolute: 0.6 10*3/uL (ref 0.1–1.0)
Monocytes Relative: 17 %
Neutro Abs: 1.7 10*3/uL (ref 1.7–7.7)
Neutrophils Relative %: 43 %
Platelets: 226 10*3/uL (ref 150–400)
RBC: 3.66 MIL/uL — ABNORMAL LOW (ref 3.87–5.11)
RDW: 16.4 % — ABNORMAL HIGH (ref 11.5–15.5)
WBC: 3.9 10*3/uL — ABNORMAL LOW (ref 4.0–10.5)
nRBC: 0 % (ref 0.0–0.2)

## 2020-06-28 NOTE — Assessment & Plan Note (Signed)
I plan to check her copper level in her next visit

## 2020-06-28 NOTE — Assessment & Plan Note (Signed)
Overall, Andrea Crawford situation is not consistent with myelodysplastic syndrome Rather, I believe she has severe copper deficiency due to zinc overdose Since discontinuation of zinc supplementation and copper replacement therapy, Andrea Crawford pancytopenia is resolving I recommend she continues on high-dose copper supplement 4 mg daily I plan to see Andrea Crawford back in 2 months for further follow-up with repeat blood work to be done a week ahead of time I anticipate Andrea Crawford pancytopenia will resolve completely by then and if Andrea Crawford copper level is adequate, we can discontinue The plan of care is discussed with Andrea Crawford husband and he is in agreement There is no contraindication for Andrea Crawford to proceed with Covid vaccination or influenza vaccination

## 2020-06-28 NOTE — Assessment & Plan Note (Signed)
She has history of epilepsy and seizure disorder and is on multiple medications I wonder if copper myelopathy might have contributed to her neurological disorder She will continue on copper replacement therapy for now

## 2020-06-28 NOTE — Progress Notes (Signed)
[facility] OFFICE PROGRESS NOTE  Patient Care Team: [name], MD as PCP - General (Family Medicine)  ASSESSMENT & PLAN:  Other pancytopenia ([place]) Overall, her situation is not consistent with myelodysplastic syndrome Rather, I believe she has severe copper deficiency due to zinc overdose Since discontinuation of zinc supplementation and copper replacement therapy, her pancytopenia is resolving I recommend she continues on high-dose copper supplement 4 mg daily I plan to see her back in 2 months for further follow-up with repeat blood work to be done a week ahead of time I anticipate her pancytopenia will resolve completely by then and if her copper level is adequate, we can discontinue The plan of care is discussed with her husband and he is in agreement There is no contraindication for her to proceed with Covid vaccination or influenza vaccination  Epilepsy ([place]) She has history of epilepsy and seizure disorder and is on multiple medications I wonder if copper myelopathy might have contributed to her neurological disorder She will continue on copper replacement therapy for now  Copper deficiency I plan to check her copper level in her next visit   Orders Placed This Encounter  Procedures  . Copper, serum    Standing Status:   Standing    Number of Occurrences:   3    Standing Expiration Date:   06/28/2021    All questions were answered. The patient knows to call the clinic with any problems, questions or concerns. The total time spent in the appointment was 20 minutes encounter with patients including review of chart and various tests results, discussions about plan of care and coordination of care plan   Heath Lark, MD 06/28/2020 2:31 PM  INTERVAL HISTORY: Please see below for problem oriented charting. She returns with her husband for further follow-up According to her husband, she has been doing fine No recent infection, fever or chills The patient  denies any recent signs or symptoms of bleeding such as spontaneous epistaxis, hematuria or hematochezia. Her husband is managing all her medications.  She is taking oral copper supplement as directed  SUMMARY OF ONCOLOGIC HISTORY: Oncology History Overview Note  Cytogenetics from bone marrow biopsy from 06/01/2020 is normal karyotype, NGS FISH panel is normal Calculated R-IPSS score is low risk   Other pancytopenia ([place])  05/29/2020 - 06/05/2020 Hospital Admission   She was hospitalized for severe pancytopenia requiring multiple units of blood transfusions along with bone marrow aspirate and biopsy   05/30/2020 Initial Diagnosis   HPI: Andrea Crawford is a 64 year old female with a past medical history significant for epilepsy, schizophrenia, and sleep apnea.  The patient presented to the emergency room due to abnormal lab work performed by her primary care provider.  CBC on admission showed a WBC of 1.4, hemoglobin 4.7, hematocrit 15.6, and platelet count of 191,000.  Chemistry significant for a BUN of 28, creatinine 1.2, calcium 8.3, and total protein of 6.1.  Absolute reticulocytes were decreased at 17.0, vitamin B12 level normal at 197, folate normal at 12.6, ferritin normal at 169 with a normal iron, TIBC, percent saturation, and UIBC.  The patient received multiple units of blood transfusion and was subsequently admitted to the hospital for further management   06/01/2020 Pathology Results   BONE MARROW, ASPIRATE, CLOT, CORE:  -  Hypercellular marrow with dyspoieisis and ringed sideroblasts  -  See comment and microscopic description below   PERIPHERAL BLOOD:  -  Pancytopenia  -  See complete blood cell count below  COMMENT:   Overall, the findings are concerning for a myelodysplastic syndrome especially myelodysplasia with ringed sideroblasts (MDS-RS).  However, non-clonal causes of dysplasia must be excluded before the diagnosis of myelodysplasia is established. These include, but are not  limited to, drug and toxin exposure, growth factor therapy, viral infections, immunologic disorders, and nutritional deficiencies (e.g., B12, copper, etc.).  Correlation with cytogenetics is recommended.  NGS panel (myeloid neoplasm) may be of interest.      REVIEW OF SYSTEMS:   Constitutional: Denies fevers, chills or abnormal weight loss Eyes: Denies blurriness of vision Ears, nose, mouth, throat, and face: Denies mucositis or sore throat Respiratory: Denies cough, dyspnea or wheezes Cardiovascular: Denies palpitation, chest discomfort or lower extremity swelling Gastrointestinal:  Denies nausea, heartburn or change in bowel habits Skin: Denies abnormal skin rashes Lymphatics: Denies new lymphadenopathy or easy bruising Neurological:Denies numbness, tingling or new weaknesses Behavioral/Psych: Mood is stable, no new changes  All other systems were reviewed with the patient and are negative.  I have reviewed the past medical history, past surgical history, social history and family history with the patient and they are unchanged from previous note.  ALLERGIES:  has No Known Allergies.  MEDICATIONS:  Current Outpatient Medications  Medication Sig Dispense Refill  . Copper Gluconate 2 MG TABS Take 7 mg by mouth daily.    Marland Kitchen acetaminophen (TYLENOL) 500 MG tablet Take 1,000 mg by mouth every 6 (six) hours as needed for headache (pain).    Marland Kitchen amantadine (SYMMETREL) 100 MG capsule Take 100 mg by mouth 2 (two) times daily.     . ARIPiprazole (ABILIFY) 15 MG tablet Take 1 tablet (15 mg total) by mouth at bedtime.    . Ascorbic Acid (VITAMIN C) 1000 MG tablet Take 1,000 mg by mouth 2 (two) times a day.    . cholecalciferol (VITAMIN D3) 25 MCG (1000 UT) tablet Take 1,000 Units by mouth daily as needed (sunshine replacement).    . cloBAZam (ONFI) 10 MG tablet Take 5-15 mg by mouth See admin instructions. 0.5 tablet (5 MG) in the morning and 1.5 tablets (15 MG) in the evening    . lacosamide  (VIMPAT) 50 MG TABS tablet Take 50-150 mg by mouth See admin instructions. Take 1 tablet (50 mg) by mouth with breakfast and lunch, take 3 tablets (150 mg) at bedtime    . lamoTRIgine (LAMICTAL) 200 MG tablet Take 200-400 mg by mouth See admin instructions. Take one tablet (200 mg) by mouth with breakfast and lunch, take two tablets (400 mg) at bedtime    . levETIRAcetam (KEPPRA) 1000 MG tablet Take 500-1,000 mg by mouth See admin instructions. Take 1/2 tablet (500 mg) by mouth with breakfast and lunch, take 1 tablet (1000 mg) at bedtime    . omeprazole (PRILOSEC) 40 MG capsule Take 40 mg by mouth at bedtime.    Marland Kitchen PRESCRIPTION MEDICATION Inhale into the lungs at bedtime. CPAP    . vitamin B-12 (CYANOCOBALAMIN) 1000 MCG tablet Take 1 tablet (1,000 mcg total) by mouth daily.     No current facility-administered medications for this visit.    PHYSICAL EXAMINATION: ECOG PERFORMANCE STATUS: 1 - Symptomatic but completely ambulatory  Vitals:   06/28/20 1245  BP: 112/72  Pulse: 68  Resp: 20  Temp: (!) 97.5 F (36.4 C)  SpO2: 100%   Filed Weights   06/28/20 1245  Weight: 129 lb (58.5 kg)    GENERAL:alert, no distress and comfortable NEURO: alert & oriented x 3 with occasional slurred  speech  LABORATORY DATA:  I have reviewed the data as listed    Component Value Date/Time   NA 130 (L) 06/05/2020 0822   K 3.4 (L) 06/05/2020 0822   CL 91 (L) 06/05/2020 0822   CO2 28 06/05/2020 0822   GLUCOSE 107 (H) 06/05/2020 0822   BUN 18 06/05/2020 0822   CREATININE 0.69 06/05/2020 0822   CALCIUM 8.2 (L) 06/05/2020 0822   PROT 6.1 (L) 06/01/2020 0950   ALBUMIN 3.7 06/01/2020 0950   AST 23 06/01/2020 0950   ALT 35 06/01/2020 0950   ALKPHOS 103 06/01/2020 0950   BILITOT 1.2 06/01/2020 0950   GFRNONAA >60 06/05/2020 0822   GFRAA >60 06/05/2020 0822    No results found for: SPEP, UPEP  Lab Results  Component Value Date   WBC 3.9 (L) 06/28/2020   NEUTROABS 1.7 06/28/2020   HGB 11.4 (L)  06/28/2020   HCT 37.3 06/28/2020   MCV 101.9 (H) 06/28/2020   PLT 226 06/28/2020      Chemistry      Component Value Date/Time   NA 130 (L) 06/05/2020 0822   K 3.4 (L) 06/05/2020 0822   CL 91 (L) 06/05/2020 0822   CO2 28 06/05/2020 0822   BUN 18 06/05/2020 0822   CREATININE 0.69 06/05/2020 0822      Component Value Date/Time   CALCIUM 8.2 (L) 06/05/2020 0822   ALKPHOS 103 06/01/2020 0950   AST 23 06/01/2020 0950   ALT 35 06/01/2020 0950   BILITOT 1.2 06/01/2020 0950

## 2020-06-29 DIAGNOSIS — R600 Localized edema: Secondary | ICD-10-CM | POA: Diagnosis not present

## 2020-06-29 DIAGNOSIS — D72819 Decreased white blood cell count, unspecified: Secondary | ICD-10-CM | POA: Diagnosis not present

## 2020-06-29 DIAGNOSIS — I119 Hypertensive heart disease without heart failure: Secondary | ICD-10-CM | POA: Diagnosis not present

## 2020-06-29 DIAGNOSIS — D61818 Other pancytopenia: Secondary | ICD-10-CM | POA: Diagnosis not present

## 2020-06-29 DIAGNOSIS — I313 Pericardial effusion (noninflammatory): Secondary | ICD-10-CM | POA: Diagnosis not present

## 2020-06-29 DIAGNOSIS — D709 Neutropenia, unspecified: Secondary | ICD-10-CM | POA: Diagnosis not present

## 2020-06-30 DIAGNOSIS — D709 Neutropenia, unspecified: Secondary | ICD-10-CM | POA: Diagnosis not present

## 2020-06-30 DIAGNOSIS — I119 Hypertensive heart disease without heart failure: Secondary | ICD-10-CM | POA: Diagnosis not present

## 2020-06-30 DIAGNOSIS — D72819 Decreased white blood cell count, unspecified: Secondary | ICD-10-CM | POA: Diagnosis not present

## 2020-06-30 DIAGNOSIS — M25572 Pain in left ankle and joints of left foot: Secondary | ICD-10-CM | POA: Diagnosis not present

## 2020-06-30 DIAGNOSIS — M1712 Unilateral primary osteoarthritis, left knee: Secondary | ICD-10-CM | POA: Diagnosis not present

## 2020-06-30 DIAGNOSIS — R600 Localized edema: Secondary | ICD-10-CM | POA: Diagnosis not present

## 2020-06-30 DIAGNOSIS — D61818 Other pancytopenia: Secondary | ICD-10-CM | POA: Diagnosis not present

## 2020-06-30 DIAGNOSIS — M545 Low back pain: Secondary | ICD-10-CM | POA: Diagnosis not present

## 2020-06-30 DIAGNOSIS — I313 Pericardial effusion (noninflammatory): Secondary | ICD-10-CM | POA: Diagnosis not present

## 2020-07-04 DIAGNOSIS — I313 Pericardial effusion (noninflammatory): Secondary | ICD-10-CM | POA: Diagnosis not present

## 2020-07-04 DIAGNOSIS — D709 Neutropenia, unspecified: Secondary | ICD-10-CM | POA: Diagnosis not present

## 2020-07-04 DIAGNOSIS — D72819 Decreased white blood cell count, unspecified: Secondary | ICD-10-CM | POA: Diagnosis not present

## 2020-07-04 DIAGNOSIS — I119 Hypertensive heart disease without heart failure: Secondary | ICD-10-CM | POA: Diagnosis not present

## 2020-07-04 DIAGNOSIS — R600 Localized edema: Secondary | ICD-10-CM | POA: Diagnosis not present

## 2020-07-04 DIAGNOSIS — D61818 Other pancytopenia: Secondary | ICD-10-CM | POA: Diagnosis not present

## 2020-07-04 LAB — SURGICAL PATHOLOGY

## 2020-07-05 DIAGNOSIS — R600 Localized edema: Secondary | ICD-10-CM | POA: Diagnosis not present

## 2020-07-05 DIAGNOSIS — D709 Neutropenia, unspecified: Secondary | ICD-10-CM | POA: Diagnosis not present

## 2020-07-05 DIAGNOSIS — I313 Pericardial effusion (noninflammatory): Secondary | ICD-10-CM | POA: Diagnosis not present

## 2020-07-05 DIAGNOSIS — D72819 Decreased white blood cell count, unspecified: Secondary | ICD-10-CM | POA: Diagnosis not present

## 2020-07-05 DIAGNOSIS — D61818 Other pancytopenia: Secondary | ICD-10-CM | POA: Diagnosis not present

## 2020-07-05 DIAGNOSIS — I119 Hypertensive heart disease without heart failure: Secondary | ICD-10-CM | POA: Diagnosis not present

## 2020-07-06 DIAGNOSIS — D72819 Decreased white blood cell count, unspecified: Secondary | ICD-10-CM | POA: Diagnosis not present

## 2020-07-06 DIAGNOSIS — R600 Localized edema: Secondary | ICD-10-CM | POA: Diagnosis not present

## 2020-07-06 DIAGNOSIS — Z83511 Family history of glaucoma: Secondary | ICD-10-CM | POA: Diagnosis not present

## 2020-07-06 DIAGNOSIS — D709 Neutropenia, unspecified: Secondary | ICD-10-CM | POA: Diagnosis not present

## 2020-07-06 DIAGNOSIS — D61818 Other pancytopenia: Secondary | ICD-10-CM | POA: Diagnosis not present

## 2020-07-06 DIAGNOSIS — I119 Hypertensive heart disease without heart failure: Secondary | ICD-10-CM | POA: Diagnosis not present

## 2020-07-06 DIAGNOSIS — H40013 Open angle with borderline findings, low risk, bilateral: Secondary | ICD-10-CM | POA: Diagnosis not present

## 2020-07-06 DIAGNOSIS — I313 Pericardial effusion (noninflammatory): Secondary | ICD-10-CM | POA: Diagnosis not present

## 2020-07-06 DIAGNOSIS — H04123 Dry eye syndrome of bilateral lacrimal glands: Secondary | ICD-10-CM | POA: Diagnosis not present

## 2020-07-07 DIAGNOSIS — I119 Hypertensive heart disease without heart failure: Secondary | ICD-10-CM | POA: Diagnosis not present

## 2020-07-07 DIAGNOSIS — D61818 Other pancytopenia: Secondary | ICD-10-CM | POA: Diagnosis not present

## 2020-07-07 DIAGNOSIS — I313 Pericardial effusion (noninflammatory): Secondary | ICD-10-CM | POA: Diagnosis not present

## 2020-07-07 DIAGNOSIS — D72819 Decreased white blood cell count, unspecified: Secondary | ICD-10-CM | POA: Diagnosis not present

## 2020-07-07 DIAGNOSIS — R600 Localized edema: Secondary | ICD-10-CM | POA: Diagnosis not present

## 2020-07-07 DIAGNOSIS — D709 Neutropenia, unspecified: Secondary | ICD-10-CM | POA: Diagnosis not present

## 2020-07-08 DIAGNOSIS — I119 Hypertensive heart disease without heart failure: Secondary | ICD-10-CM | POA: Diagnosis not present

## 2020-07-08 DIAGNOSIS — Z9181 History of falling: Secondary | ICD-10-CM | POA: Diagnosis not present

## 2020-07-08 DIAGNOSIS — N179 Acute kidney failure, unspecified: Secondary | ICD-10-CM | POA: Diagnosis not present

## 2020-07-08 DIAGNOSIS — G4733 Obstructive sleep apnea (adult) (pediatric): Secondary | ICD-10-CM | POA: Diagnosis not present

## 2020-07-08 DIAGNOSIS — F209 Schizophrenia, unspecified: Secondary | ICD-10-CM | POA: Diagnosis not present

## 2020-07-08 DIAGNOSIS — R4781 Slurred speech: Secondary | ICD-10-CM | POA: Diagnosis not present

## 2020-07-08 DIAGNOSIS — J90 Pleural effusion, not elsewhere classified: Secondary | ICD-10-CM | POA: Diagnosis not present

## 2020-07-08 DIAGNOSIS — D61818 Other pancytopenia: Secondary | ICD-10-CM | POA: Diagnosis not present

## 2020-07-08 DIAGNOSIS — D709 Neutropenia, unspecified: Secondary | ICD-10-CM | POA: Diagnosis not present

## 2020-07-08 DIAGNOSIS — I051 Rheumatic mitral insufficiency: Secondary | ICD-10-CM | POA: Diagnosis not present

## 2020-07-08 DIAGNOSIS — R410 Disorientation, unspecified: Secondary | ICD-10-CM | POA: Diagnosis not present

## 2020-07-08 DIAGNOSIS — G259 Extrapyramidal and movement disorder, unspecified: Secondary | ICD-10-CM | POA: Diagnosis not present

## 2020-07-08 DIAGNOSIS — F329 Major depressive disorder, single episode, unspecified: Secondary | ICD-10-CM | POA: Diagnosis not present

## 2020-07-08 DIAGNOSIS — D72819 Decreased white blood cell count, unspecified: Secondary | ICD-10-CM | POA: Diagnosis not present

## 2020-07-08 DIAGNOSIS — M1712 Unilateral primary osteoarthritis, left knee: Secondary | ICD-10-CM | POA: Diagnosis not present

## 2020-07-08 DIAGNOSIS — R600 Localized edema: Secondary | ICD-10-CM | POA: Diagnosis not present

## 2020-07-08 DIAGNOSIS — I313 Pericardial effusion (noninflammatory): Secondary | ICD-10-CM | POA: Diagnosis not present

## 2020-07-08 DIAGNOSIS — G40909 Epilepsy, unspecified, not intractable, without status epilepticus: Secondary | ICD-10-CM | POA: Diagnosis not present

## 2020-07-11 DIAGNOSIS — I119 Hypertensive heart disease without heart failure: Secondary | ICD-10-CM | POA: Diagnosis not present

## 2020-07-11 DIAGNOSIS — D709 Neutropenia, unspecified: Secondary | ICD-10-CM | POA: Diagnosis not present

## 2020-07-11 DIAGNOSIS — I313 Pericardial effusion (noninflammatory): Secondary | ICD-10-CM | POA: Diagnosis not present

## 2020-07-11 DIAGNOSIS — R600 Localized edema: Secondary | ICD-10-CM | POA: Diagnosis not present

## 2020-07-11 DIAGNOSIS — D61818 Other pancytopenia: Secondary | ICD-10-CM | POA: Diagnosis not present

## 2020-07-11 DIAGNOSIS — D72819 Decreased white blood cell count, unspecified: Secondary | ICD-10-CM | POA: Diagnosis not present

## 2020-07-13 DIAGNOSIS — G4733 Obstructive sleep apnea (adult) (pediatric): Secondary | ICD-10-CM | POA: Diagnosis not present

## 2020-07-13 DIAGNOSIS — M8580 Other specified disorders of bone density and structure, unspecified site: Secondary | ICD-10-CM | POA: Diagnosis not present

## 2020-07-13 DIAGNOSIS — Z0000 Encounter for general adult medical examination without abnormal findings: Secondary | ICD-10-CM | POA: Diagnosis not present

## 2020-07-13 DIAGNOSIS — Z79899 Other long term (current) drug therapy: Secondary | ICD-10-CM | POA: Diagnosis not present

## 2020-07-13 DIAGNOSIS — G40909 Epilepsy, unspecified, not intractable, without status epilepticus: Secondary | ICD-10-CM | POA: Diagnosis not present

## 2020-07-13 DIAGNOSIS — E785 Hyperlipidemia, unspecified: Secondary | ICD-10-CM | POA: Diagnosis not present

## 2020-07-13 DIAGNOSIS — R8290 Unspecified abnormal findings in urine: Secondary | ICD-10-CM | POA: Diagnosis not present

## 2020-07-13 DIAGNOSIS — Z23 Encounter for immunization: Secondary | ICD-10-CM | POA: Diagnosis not present

## 2020-07-13 DIAGNOSIS — F29 Unspecified psychosis not due to a substance or known physiological condition: Secondary | ICD-10-CM | POA: Diagnosis not present

## 2020-07-13 DIAGNOSIS — D61818 Other pancytopenia: Secondary | ICD-10-CM | POA: Diagnosis not present

## 2020-07-14 DIAGNOSIS — I119 Hypertensive heart disease without heart failure: Secondary | ICD-10-CM | POA: Diagnosis not present

## 2020-07-14 DIAGNOSIS — D709 Neutropenia, unspecified: Secondary | ICD-10-CM | POA: Diagnosis not present

## 2020-07-14 DIAGNOSIS — I313 Pericardial effusion (noninflammatory): Secondary | ICD-10-CM | POA: Diagnosis not present

## 2020-07-14 DIAGNOSIS — R600 Localized edema: Secondary | ICD-10-CM | POA: Diagnosis not present

## 2020-07-14 DIAGNOSIS — D61818 Other pancytopenia: Secondary | ICD-10-CM | POA: Diagnosis not present

## 2020-07-14 DIAGNOSIS — D72819 Decreased white blood cell count, unspecified: Secondary | ICD-10-CM | POA: Diagnosis not present

## 2020-07-17 DIAGNOSIS — I119 Hypertensive heart disease without heart failure: Secondary | ICD-10-CM | POA: Diagnosis not present

## 2020-07-17 DIAGNOSIS — D709 Neutropenia, unspecified: Secondary | ICD-10-CM | POA: Diagnosis not present

## 2020-07-17 DIAGNOSIS — D61818 Other pancytopenia: Secondary | ICD-10-CM | POA: Diagnosis not present

## 2020-07-17 DIAGNOSIS — D72819 Decreased white blood cell count, unspecified: Secondary | ICD-10-CM | POA: Diagnosis not present

## 2020-07-17 DIAGNOSIS — R600 Localized edema: Secondary | ICD-10-CM | POA: Diagnosis not present

## 2020-07-17 DIAGNOSIS — I313 Pericardial effusion (noninflammatory): Secondary | ICD-10-CM | POA: Diagnosis not present

## 2020-07-18 DIAGNOSIS — I119 Hypertensive heart disease without heart failure: Secondary | ICD-10-CM | POA: Diagnosis not present

## 2020-07-18 DIAGNOSIS — D709 Neutropenia, unspecified: Secondary | ICD-10-CM | POA: Diagnosis not present

## 2020-07-18 DIAGNOSIS — D72819 Decreased white blood cell count, unspecified: Secondary | ICD-10-CM | POA: Diagnosis not present

## 2020-07-18 DIAGNOSIS — I313 Pericardial effusion (noninflammatory): Secondary | ICD-10-CM | POA: Diagnosis not present

## 2020-07-18 DIAGNOSIS — R600 Localized edema: Secondary | ICD-10-CM | POA: Diagnosis not present

## 2020-07-18 DIAGNOSIS — D61818 Other pancytopenia: Secondary | ICD-10-CM | POA: Diagnosis not present

## 2020-07-19 DIAGNOSIS — D61818 Other pancytopenia: Secondary | ICD-10-CM | POA: Diagnosis not present

## 2020-07-19 DIAGNOSIS — R600 Localized edema: Secondary | ICD-10-CM | POA: Diagnosis not present

## 2020-07-19 DIAGNOSIS — I119 Hypertensive heart disease without heart failure: Secondary | ICD-10-CM | POA: Diagnosis not present

## 2020-07-19 DIAGNOSIS — D72819 Decreased white blood cell count, unspecified: Secondary | ICD-10-CM | POA: Diagnosis not present

## 2020-07-19 DIAGNOSIS — I313 Pericardial effusion (noninflammatory): Secondary | ICD-10-CM | POA: Diagnosis not present

## 2020-07-19 DIAGNOSIS — D709 Neutropenia, unspecified: Secondary | ICD-10-CM | POA: Diagnosis not present

## 2020-07-20 DIAGNOSIS — I119 Hypertensive heart disease without heart failure: Secondary | ICD-10-CM | POA: Diagnosis not present

## 2020-07-20 DIAGNOSIS — I313 Pericardial effusion (noninflammatory): Secondary | ICD-10-CM | POA: Diagnosis not present

## 2020-07-20 DIAGNOSIS — D709 Neutropenia, unspecified: Secondary | ICD-10-CM | POA: Diagnosis not present

## 2020-07-20 DIAGNOSIS — D61818 Other pancytopenia: Secondary | ICD-10-CM | POA: Diagnosis not present

## 2020-07-20 DIAGNOSIS — R600 Localized edema: Secondary | ICD-10-CM | POA: Diagnosis not present

## 2020-07-20 DIAGNOSIS — D72819 Decreased white blood cell count, unspecified: Secondary | ICD-10-CM | POA: Diagnosis not present

## 2020-07-22 DIAGNOSIS — L03012 Cellulitis of left finger: Secondary | ICD-10-CM | POA: Diagnosis not present

## 2020-07-24 DIAGNOSIS — I313 Pericardial effusion (noninflammatory): Secondary | ICD-10-CM | POA: Diagnosis not present

## 2020-07-24 DIAGNOSIS — D61818 Other pancytopenia: Secondary | ICD-10-CM | POA: Diagnosis not present

## 2020-07-24 DIAGNOSIS — D709 Neutropenia, unspecified: Secondary | ICD-10-CM | POA: Diagnosis not present

## 2020-07-24 DIAGNOSIS — D72819 Decreased white blood cell count, unspecified: Secondary | ICD-10-CM | POA: Diagnosis not present

## 2020-07-24 DIAGNOSIS — I119 Hypertensive heart disease without heart failure: Secondary | ICD-10-CM | POA: Diagnosis not present

## 2020-07-24 DIAGNOSIS — R600 Localized edema: Secondary | ICD-10-CM | POA: Diagnosis not present

## 2020-07-27 DIAGNOSIS — Z23 Encounter for immunization: Secondary | ICD-10-CM | POA: Diagnosis not present

## 2020-08-01 NOTE — Progress Notes (Signed)
CARDIOLOGY CONSULT NOTE       Patient ID: Andrea Crawford MRN: [id number] DOB/AGE: 11-22-1955 64 y.o.  Admit date: (Not on file) Referring Physician: Little Primary Physician: [name], MD Primary Cardiologist: New Reason for Consultation: Dyspnea/Edema  Active Problems:   * No active hospital problems. *   HPI:  64 y.o. referred by Dr Rex Kras for dyspnea and edema.  She has a history of epilepsy, schizophrenia, OSA and pancytopenia requiring transfusion and followed by oncology Dr Alvy Bimler copper deficiency from zinc overload. In this setting had more peripheral edema and dyspnea for 10 days Her Abilify dose had been increased Her BNP was only 101  She had Hb 4.7 and required multiple transfusions Echo showed normal EF 62-69% normal diastolic parameters normal RV and no significant valve disease She had high flow velocities across all valves consistent with anemia   Off all Zink and on copper her Hb has rebounded to 11.4 she has no dyspnea or edema  Her husband does most of the talking He appears a bit manic as well. Focuses on her seizures and manic depressive Issues and how important it is not to adjust her Abilify and how she needs a "complex cocktail" of 3 drugs to prevent seizures   ROS All other systems reviewed and negative except as noted above  Past Medical History:  Diagnosis Date  . Drug-induced Parkinson's disease ([place])   . History of complex partial epilepsy   . Murmur, cardiac   . Osteoarthritis   . Osteopenia   . Pedal edema   . Seizure disorder ([place])   . Seizures ([place])     Family History  Problem Relation Age of Onset  . Arthritis/Rheumatoid Mother   . Melanoma Sister   . Melanoma Brother   . Lung cancer Brother   . Breast cancer Paternal Aunt   . Heart attack Father   . Diabetes Sister   . Seizures Neg Hx     Social History   Socioeconomic History  . Marital status: Married    Spouse name: Not on file  . Number of children: 4  . Years of  education: Not on file  . Highest education level: Not on file  Occupational History  . Not on file  Tobacco Use  . Smoking status: Never Smoker  . Smokeless tobacco: Never Used  Substance and Sexual Activity  . Alcohol use: No  . Drug use: No  . Sexual activity: Not on file  Other Topics Concern  . Not on file  Social History Narrative  . Not on file   Social Determinants of Health   Financial Resource Strain:   . Difficulty of Paying Living Expenses: Not on file  Food Insecurity:   . Worried About Charity fundraiser in the Last Year: Not on file  . Ran Out of Food in the Last Year: Not on file  Transportation Needs:   . Lack of Transportation (Medical): Not on file  . Lack of Transportation (Non-Medical): Not on file  Physical Activity:   . Days of Exercise per Week: Not on file  . Minutes of Exercise per Session: Not on file  Stress:   . Feeling of Stress : Not on file  Social Connections:   . Frequency of Communication with Friends and Family: Not on file  . Frequency of Social Gatherings with Friends and Family: Not on file  . Attends Religious Services: Not on file  . Active Member of Clubs or Organizations: Not  on file  . Attends Archivist Meetings: Not on file  . Marital Status: Not on file  Intimate Partner Violence:   . Fear of Current or Ex-Partner: Not on file  . Emotionally Abused: Not on file  . Physically Abused: Not on file  . Sexually Abused: Not on file    Past Surgical History:  Procedure Laterality Date  . ESOPHAGOGASTRODUODENOSCOPY    . TONSILLECTOMY        Current Outpatient Medications:  .  acetaminophen (TYLENOL) 500 MG tablet, Take 1,000 mg by mouth every 6 (six) hours as needed for headache (pain)., Disp: , Rfl:  .  amantadine (SYMMETREL) 100 MG capsule, Take 100 mg by mouth 2 (two) times daily. , Disp: , Rfl:  .  ARIPiprazole (ABILIFY) 20 MG tablet, Take 20 mg by mouth daily., Disp: , Rfl:  .  cloBAZam (ONFI) 10 MG tablet,  Take 5-15 mg by mouth See admin instructions. 0.5 tablet (5 MG) in the morning and 1.5 tablets (15 MG) in the evening, Disp: , Rfl:  .  Copper Gluconate 2 MG TABS, Take 7 mg by mouth daily., Disp: , Rfl:  .  lacosamide (VIMPAT) 50 MG TABS tablet, Take 50-150 mg by mouth See admin instructions. Take 1 tablet (50 mg) by mouth with breakfast and lunch, take 3 tablets (150 mg) at bedtime, Disp: , Rfl:  .  lamoTRIgine (LAMICTAL) 200 MG tablet, Take 200-400 mg by mouth See admin instructions. Take one tablet (200 mg) by mouth with breakfast and lunch, take two tablets (400 mg) at bedtime, Disp: , Rfl:  .  levETIRAcetam (KEPPRA) 1000 MG tablet, Take 500-1,000 mg by mouth See admin instructions. Take 1/2 tablet (500 mg) by mouth with breakfast and lunch, take 1 tablet (1000 mg) at bedtime, Disp: , Rfl:  .  omeprazole (PRILOSEC) 40 MG capsule, Take 40 mg by mouth at bedtime., Disp: , Rfl:  .  PRESCRIPTION MEDICATION, Inhale into the lungs at bedtime. CPAP, Disp: , Rfl:  .  vitamin B-12 (CYANOCOBALAMIN) 1000 MCG tablet, Take 1 tablet (1,000 mcg total) by mouth daily., Disp: , Rfl:     Physical Exam: Blood pressure 118/76, pulse 69, height 5\' 3"  (1.6 m), weight 130 lb (59 kg), SpO2 99 %.    Affect appropriate Healthy:  appears stated age 64: normal Neck supple with no adenopathy JVP normal no bruits no thyromegaly Lungs clear with no wheezing and good diaphragmatic motion Heart:  S1/S2 no murmur, no rub, gallop or click PMI normal Abdomen: benighn, BS positve, no tenderness, no AAA no bruit.  No HSM or HJR Distal pulses intact with no bruits No edema Neuro non-focal Skin warm and dry No muscular weakness   Labs:   Lab Results  Component Value Date   WBC 3.9 (L) 06/28/2020   HGB 11.4 (L) 06/28/2020   HCT 37.3 06/28/2020   MCV 101.9 (H) 06/28/2020   PLT 226 06/28/2020    Radiology: No results found.  EKG: SR 72 normal ECG 05/31/20    ASSESSMENT AND PLAN:   1. Edema/Dyspnea: not  related to cardiac issues Rhythm normal , BNP normal echo normal diastolic and systolic function In setting of high output and profound anemia.   2. Pancytopenia:  Copper level low followed by oncology not thought to be myelodysplastic syndrome  3. Bipolar:  On Abilify 4. Seizures; ? Need to simplify meds she is on lamictal, keppra, Vimpat and amantadine   F/U with cardiology PRN   Signed: Collier Salina  Taijon Vink 08/14/2020, 3:58 PM

## 2020-08-02 DIAGNOSIS — I313 Pericardial effusion (noninflammatory): Secondary | ICD-10-CM | POA: Diagnosis not present

## 2020-08-02 DIAGNOSIS — D61818 Other pancytopenia: Secondary | ICD-10-CM | POA: Diagnosis not present

## 2020-08-02 DIAGNOSIS — D72819 Decreased white blood cell count, unspecified: Secondary | ICD-10-CM | POA: Diagnosis not present

## 2020-08-02 DIAGNOSIS — I119 Hypertensive heart disease without heart failure: Secondary | ICD-10-CM | POA: Diagnosis not present

## 2020-08-02 DIAGNOSIS — D709 Neutropenia, unspecified: Secondary | ICD-10-CM | POA: Diagnosis not present

## 2020-08-02 DIAGNOSIS — R600 Localized edema: Secondary | ICD-10-CM | POA: Diagnosis not present

## 2020-08-04 DIAGNOSIS — D72819 Decreased white blood cell count, unspecified: Secondary | ICD-10-CM | POA: Diagnosis not present

## 2020-08-04 DIAGNOSIS — D709 Neutropenia, unspecified: Secondary | ICD-10-CM | POA: Diagnosis not present

## 2020-08-04 DIAGNOSIS — I119 Hypertensive heart disease without heart failure: Secondary | ICD-10-CM | POA: Diagnosis not present

## 2020-08-04 DIAGNOSIS — D61818 Other pancytopenia: Secondary | ICD-10-CM | POA: Diagnosis not present

## 2020-08-04 DIAGNOSIS — I313 Pericardial effusion (noninflammatory): Secondary | ICD-10-CM | POA: Diagnosis not present

## 2020-08-04 DIAGNOSIS — R600 Localized edema: Secondary | ICD-10-CM | POA: Diagnosis not present

## 2020-08-09 DIAGNOSIS — M4125 Other idiopathic scoliosis, thoracolumbar region: Secondary | ICD-10-CM | POA: Diagnosis not present

## 2020-08-09 DIAGNOSIS — M5459 Other low back pain: Secondary | ICD-10-CM | POA: Diagnosis not present

## 2020-08-09 DIAGNOSIS — M1712 Unilateral primary osteoarthritis, left knee: Secondary | ICD-10-CM | POA: Diagnosis not present

## 2020-08-11 DIAGNOSIS — M5459 Other low back pain: Secondary | ICD-10-CM | POA: Diagnosis not present

## 2020-08-11 DIAGNOSIS — M1712 Unilateral primary osteoarthritis, left knee: Secondary | ICD-10-CM | POA: Diagnosis not present

## 2020-08-11 DIAGNOSIS — M4125 Other idiopathic scoliosis, thoracolumbar region: Secondary | ICD-10-CM | POA: Diagnosis not present

## 2020-08-14 ENCOUNTER — Other Ambulatory Visit: Payer: Self-pay

## 2020-08-14 ENCOUNTER — Ambulatory Visit (INDEPENDENT_AMBULATORY_CARE_PROVIDER_SITE_OTHER): Payer: Medicare Other | Admitting: Cardiovascular Disease

## 2020-08-14 ENCOUNTER — Encounter: Payer: Self-pay | Admitting: Cardiovascular Disease

## 2020-08-14 VITALS — BP 118/76 | HR 69 | Ht 63.0 in | Wt 130.0 lb

## 2020-08-14 DIAGNOSIS — R0600 Dyspnea, unspecified: Secondary | ICD-10-CM | POA: Diagnosis not present

## 2020-08-14 DIAGNOSIS — R0609 Other forms of dyspnea: Secondary | ICD-10-CM

## 2020-08-14 NOTE — Patient Instructions (Addendum)
Medication Instructions:  *If you need a refill on your cardiac medications before your next appointment, please call your pharmacy*  Lab Work: If you have labs (blood work) drawn today and your tests are completely normal, you will receive your results only by: Marland Kitchen MyChart Message (if you have MyChart) OR . A paper copy in the mail If you have any lab test that is abnormal or we need to change your treatment, we will call you to review the results.  Testing/Procedures: None ordered today.  Follow-Up: At [facility], you and your health needs are our priority.  As part of our continuing mission to provide you with exceptional heart care, we have created designated Provider Care Teams.  These Care Teams include your primary Cardiologist (physician) and Advanced Practice Providers (APPs -  Physician Assistants and Nurse Practitioners) who all work together to provide you with the care you need, when you need it.  We recommend signing up for the patient portal called "MyChart".  Sign up information is provided on this After Visit Summary.  MyChart is used to connect with patients for Virtual Visits (Telemedicine).  Patients are able to view lab/test results, encounter notes, upcoming appointments, etc.  Non-urgent messages can be sent to your provider as well.   To learn more about what you can do with MyChart, go to [email or web address].    Your next appointment:   As needed  The format for your next appointment:   In Person  Provider:   You may see Dr. Johnsie Cancel or one of the following Advanced Practice Providers on your designated Care Team:    Truitt Merle, NP  Cecilie Kicks, NP  Kathyrn Drown, NP

## 2020-08-16 DIAGNOSIS — M5459 Other low back pain: Secondary | ICD-10-CM | POA: Diagnosis not present

## 2020-08-16 DIAGNOSIS — M1712 Unilateral primary osteoarthritis, left knee: Secondary | ICD-10-CM | POA: Diagnosis not present

## 2020-08-16 DIAGNOSIS — M4125 Other idiopathic scoliosis, thoracolumbar region: Secondary | ICD-10-CM | POA: Diagnosis not present

## 2020-08-18 DIAGNOSIS — M5459 Other low back pain: Secondary | ICD-10-CM | POA: Diagnosis not present

## 2020-08-18 DIAGNOSIS — M4125 Other idiopathic scoliosis, thoracolumbar region: Secondary | ICD-10-CM | POA: Diagnosis not present

## 2020-08-18 DIAGNOSIS — M1712 Unilateral primary osteoarthritis, left knee: Secondary | ICD-10-CM | POA: Diagnosis not present

## 2020-08-21 ENCOUNTER — Inpatient Hospital Stay: Payer: Medicare Other | Attending: Hematology and Oncology

## 2020-08-21 ENCOUNTER — Other Ambulatory Visit: Payer: Self-pay

## 2020-08-21 DIAGNOSIS — D61818 Other pancytopenia: Secondary | ICD-10-CM | POA: Diagnosis not present

## 2020-08-21 DIAGNOSIS — E610 Copper deficiency: Secondary | ICD-10-CM

## 2020-08-21 LAB — CBC WITH DIFFERENTIAL/PLATELET
Abs Immature Granulocytes: 0.01 10*3/uL (ref 0.00–0.07)
Basophils Absolute: 0.1 10*3/uL (ref 0.0–0.1)
Basophils Relative: 1 %
Eosinophils Absolute: 0.1 10*3/uL (ref 0.0–0.5)
Eosinophils Relative: 1 %
HCT: 41 % (ref 36.0–46.0)
Hemoglobin: 13 g/dL (ref 12.0–15.0)
Immature Granulocytes: 0 %
Lymphocytes Relative: 23 %
Lymphs Abs: 1.5 10*3/uL (ref 0.7–4.0)
MCH: 30.1 pg (ref 26.0–34.0)
MCHC: 31.7 g/dL (ref 30.0–36.0)
MCV: 94.9 fL (ref 80.0–100.0)
Monocytes Absolute: 0.5 10*3/uL (ref 0.1–1.0)
Monocytes Relative: 8 %
Neutro Abs: 4.3 10*3/uL (ref 1.7–7.7)
Neutrophils Relative %: 67 %
Platelets: 196 10*3/uL (ref 150–400)
RBC: 4.32 MIL/uL (ref 3.87–5.11)
RDW: 13.2 % (ref 11.5–15.5)
WBC: 6.4 10*3/uL (ref 4.0–10.5)
nRBC: 0 % (ref 0.0–0.2)

## 2020-08-23 DIAGNOSIS — M1712 Unilateral primary osteoarthritis, left knee: Secondary | ICD-10-CM | POA: Diagnosis not present

## 2020-08-23 DIAGNOSIS — M4125 Other idiopathic scoliosis, thoracolumbar region: Secondary | ICD-10-CM | POA: Diagnosis not present

## 2020-08-23 DIAGNOSIS — M5459 Other low back pain: Secondary | ICD-10-CM | POA: Diagnosis not present

## 2020-08-25 DIAGNOSIS — M5459 Other low back pain: Secondary | ICD-10-CM | POA: Diagnosis not present

## 2020-08-25 DIAGNOSIS — M1712 Unilateral primary osteoarthritis, left knee: Secondary | ICD-10-CM | POA: Diagnosis not present

## 2020-08-25 DIAGNOSIS — M4125 Other idiopathic scoliosis, thoracolumbar region: Secondary | ICD-10-CM | POA: Diagnosis not present

## 2020-08-27 LAB — COPPER, SERUM: Copper: 128 ug/dL (ref 80–158)

## 2020-08-28 ENCOUNTER — Inpatient Hospital Stay: Payer: Medicare Other | Attending: Hematology and Oncology | Admitting: Hematology and Oncology

## 2020-08-28 ENCOUNTER — Encounter: Payer: Self-pay | Admitting: Hematology and Oncology

## 2020-08-28 ENCOUNTER — Other Ambulatory Visit: Payer: Self-pay

## 2020-08-28 DIAGNOSIS — D61818 Other pancytopenia: Secondary | ICD-10-CM | POA: Diagnosis not present

## 2020-08-28 NOTE — Assessment & Plan Note (Signed)
This is resolved Her CBC has normalized since adequate copper replacement therapy She can discontinue copper and vitamin B12 once her prescription runs out I gave the husband explicit instruction that the patient should not take any more zinc supplement in the future

## 2020-08-28 NOTE — Progress Notes (Signed)
[facility] OFFICE PROGRESS NOTE  Little, Kevin, MD  ASSESSMENT & PLAN:  Other pancytopenia ([facility]) This is resolved Her CBC has normalized since adequate copper replacement therapy She can discontinue copper and vitamin B12 once her prescription runs out I gave the husband explicit instruction that the patient should not take any more zinc supplement in the future   No orders of the defined types were placed in this encounter.   The total time spent in the appointment was 15 minutes encounter with patients including review of chart and various tests results, discussions about plan of care and coordination of care plan   All questions were answered. The patient knows to call the clinic with any problems, questions or concerns. No barriers to learning was detected.     , MD 11/1/20213:34 PM  INTERVAL HISTORY: Andrea Crawford 64 y.o. female returns for further follow-up on pancytopenia, suspect zinc toxicity causing copper deficiency resulting in severe pancytopenia She is taking vitamin B12 and copper supplement as prescribed Her husband is vigilant about monitoring her medicine intake  SUMMARY OF HEMATOLOGIC HISTORY: Oncology History Overview Note  Cytogenetics from bone marrow biopsy from 06/01/2020 is normal karyotype, NGS FISH panel is normal Calculated R-IPSS score is low risk   Other pancytopenia ([facility])  05/29/2020 - 06/05/2020 Hospital Admission   She was hospitalized for severe pancytopenia requiring multiple units of blood transfusions along with bone marrow aspirate and biopsy   05/30/2020 Initial Diagnosis   HPI: Andrea Crawford is a 64-year-old female with a past medical history significant for epilepsy, schizophrenia, and sleep apnea.  The patient presented to the emergency room due to abnormal lab work performed by her primary care provider.  CBC on admission showed a WBC of 1.4, hemoglobin 4.7, hematocrit 15.6, and platelet count of 191,000.  Chemistry  significant for a BUN of 28, creatinine 1.2, calcium 8.3, and total protein of 6.1.  Absolute reticulocytes were decreased at 17.0, vitamin B12 level normal at 197, folate normal at 12.6, ferritin normal at 169 with a normal iron, TIBC, percent saturation, and UIBC.  The patient received multiple units of blood transfusion and was subsequently admitted to the hospital for further management   06/01/2020 Pathology Results   BONE MARROW, ASPIRATE, CLOT, CORE:  -  Hypercellular marrow with dyspoieisis and ringed sideroblasts  -  See comment and microscopic description below   PERIPHERAL BLOOD:  -  Pancytopenia  -  See complete blood cell count below   COMMENT:   Overall, the findings are concerning for a myelodysplastic syndrome especially myelodysplasia with ringed sideroblasts (MDS-RS).  However, non-clonal causes of dysplasia must be excluded before the diagnosis of myelodysplasia is established. These include, but are not limited to, drug and toxin exposure, growth factor therapy, viral infections, immunologic disorders, and nutritional deficiencies (e.g., B12, copper, etc.).  Correlation with cytogenetics is recommended.  NGS panel (myeloid neoplasm) may be of interest.       I have reviewed the past medical history, past surgical history, social history and family history with the patient and they are unchanged from previous note.  ALLERGIES:  has No Known Allergies.  MEDICATIONS:  Current Outpatient Medications  Medication Sig Dispense Refill  . acetaminophen (TYLENOL) 500 MG tablet Take 1,000 mg by mouth every 6 (six) hours as needed for headache (pain).    . amantadine (SYMMETREL) 100 MG capsule Take 100 mg by mouth 2 (two) times daily.     . ARIPiprazole (ABILIFY) 20 MG tablet   Take 20 mg by mouth daily.    . cloBAZam (ONFI) 10 MG tablet Take 5-15 mg by mouth See admin instructions. 0.5 tablet (5 MG) in the morning and 1.5 tablets (15 MG) in the evening    . Copper Gluconate 2 MG  TABS Take 7 mg by mouth daily.    Marland Kitchen lacosamide (VIMPAT) 50 MG TABS tablet Take 50-150 mg by mouth See admin instructions. Take 1 tablet (50 mg) by mouth with breakfast and lunch, take 3 tablets (150 mg) at bedtime    . lamoTRIgine (LAMICTAL) 200 MG tablet Take 200-400 mg by mouth See admin instructions. Take one tablet (200 mg) by mouth with breakfast and lunch, take two tablets (400 mg) at bedtime    . levETIRAcetam (KEPPRA) 1000 MG tablet Take 500-1,000 mg by mouth See admin instructions. Take 1/2 tablet (500 mg) by mouth with breakfast and lunch, take 1 tablet (1000 mg) at bedtime    . omeprazole (PRILOSEC) 40 MG capsule Take 40 mg by mouth at bedtime.    Marland Kitchen PRESCRIPTION MEDICATION Inhale into the lungs at bedtime. CPAP    . vitamin B-12 (CYANOCOBALAMIN) 1000 MCG tablet Take 1 tablet (1,000 mcg total) by mouth daily.     No current facility-administered medications for this visit.     REVIEW OF SYSTEMS:   Constitutional: Denies fevers, chills or night sweats Eyes: Denies blurriness of vision Ears, nose, mouth, throat, and face: Denies mucositis or sore throat Respiratory: Denies cough, dyspnea or wheezes Cardiovascular: Denies palpitation, chest discomfort or lower extremity swelling Gastrointestinal:  Denies nausea, heartburn or change in bowel habits Skin: Denies abnormal skin rashes Lymphatics: Denies new lymphadenopathy or easy bruising Neurological:Denies numbness, tingling or new weaknesses Behavioral/Psych: Mood is stable, no new changes  All other systems were reviewed with the patient and are negative.  PHYSICAL EXAMINATION: ECOG PERFORMANCE STATUS: 0 - Asymptomatic  Vitals:   08/28/20 1245  BP: 122/72  Pulse: 61  Resp: 17  Temp: (!) 97.4 F (36.3 C)  SpO2: 100%   Filed Weights   08/28/20 1245  Weight: 132 lb 6.4 oz (60.1 kg)    GENERAL:alert, no distress and comfortable NEURO: alert & oriented x 3 with fluent speech, no focal motor/sensory deficits  LABORATORY  DATA:  I have reviewed the data as listed     Component Value Date/Time   NA 130 (L) 06/05/2020 0822   K 3.4 (L) 06/05/2020 0822   CL 91 (L) 06/05/2020 0822   CO2 28 06/05/2020 0822   GLUCOSE 107 (H) 06/05/2020 0822   BUN 18 06/05/2020 0822   CREATININE 0.69 06/05/2020 0822   CALCIUM 8.2 (L) 06/05/2020 0822   PROT 6.1 (L) 06/01/2020 0950   ALBUMIN 3.7 06/01/2020 0950   AST 23 06/01/2020 0950   ALT 35 06/01/2020 0950   ALKPHOS 103 06/01/2020 0950   BILITOT 1.2 06/01/2020 0950   GFRNONAA >60 06/05/2020 0822   GFRAA >60 06/05/2020 0822    No results found for: SPEP, UPEP  Lab Results  Component Value Date   WBC 6.4 08/21/2020   NEUTROABS 4.3 08/21/2020   HGB 13.0 08/21/2020   HCT 41.0 08/21/2020   MCV 94.9 08/21/2020   PLT 196 08/21/2020      Chemistry      Component Value Date/Time   NA 130 (L) 06/05/2020 0822   K 3.4 (L) 06/05/2020 0822   CL 91 (L) 06/05/2020 0822   CO2 28 06/05/2020 0822   BUN 18 06/05/2020 8756  CREATININE 0.69 06/05/2020 0822      Component Value Date/Time   CALCIUM 8.2 (L) 06/05/2020 0822   ALKPHOS 103 06/01/2020 0950   AST 23 06/01/2020 0950   ALT 35 06/01/2020 0950   BILITOT 1.2 06/01/2020 0950      

## 2020-08-30 DIAGNOSIS — M5459 Other low back pain: Secondary | ICD-10-CM | POA: Diagnosis not present

## 2020-08-30 DIAGNOSIS — M4125 Other idiopathic scoliosis, thoracolumbar region: Secondary | ICD-10-CM | POA: Diagnosis not present

## 2020-08-30 DIAGNOSIS — M1712 Unilateral primary osteoarthritis, left knee: Secondary | ICD-10-CM | POA: Diagnosis not present

## 2020-08-31 DIAGNOSIS — M1712 Unilateral primary osteoarthritis, left knee: Secondary | ICD-10-CM | POA: Diagnosis not present

## 2020-08-31 DIAGNOSIS — M4125 Other idiopathic scoliosis, thoracolumbar region: Secondary | ICD-10-CM | POA: Diagnosis not present

## 2020-08-31 DIAGNOSIS — M5459 Other low back pain: Secondary | ICD-10-CM | POA: Diagnosis not present

## 2020-09-04 DIAGNOSIS — M4125 Other idiopathic scoliosis, thoracolumbar region: Secondary | ICD-10-CM | POA: Diagnosis not present

## 2020-09-04 DIAGNOSIS — M1712 Unilateral primary osteoarthritis, left knee: Secondary | ICD-10-CM | POA: Diagnosis not present

## 2020-09-04 DIAGNOSIS — M5459 Other low back pain: Secondary | ICD-10-CM | POA: Diagnosis not present

## 2020-09-06 DIAGNOSIS — M1712 Unilateral primary osteoarthritis, left knee: Secondary | ICD-10-CM | POA: Diagnosis not present

## 2020-09-06 DIAGNOSIS — M5459 Other low back pain: Secondary | ICD-10-CM | POA: Diagnosis not present

## 2020-09-06 DIAGNOSIS — M4125 Other idiopathic scoliosis, thoracolumbar region: Secondary | ICD-10-CM | POA: Diagnosis not present

## 2020-09-12 DIAGNOSIS — L570 Actinic keratosis: Secondary | ICD-10-CM | POA: Diagnosis not present

## 2020-09-12 DIAGNOSIS — L821 Other seborrheic keratosis: Secondary | ICD-10-CM | POA: Diagnosis not present

## 2020-09-12 DIAGNOSIS — D225 Melanocytic nevi of trunk: Secondary | ICD-10-CM | POA: Diagnosis not present

## 2020-09-12 DIAGNOSIS — D1801 Hemangioma of skin and subcutaneous tissue: Secondary | ICD-10-CM | POA: Diagnosis not present

## 2020-09-12 DIAGNOSIS — L578 Other skin changes due to chronic exposure to nonionizing radiation: Secondary | ICD-10-CM | POA: Diagnosis not present

## 2020-09-12 DIAGNOSIS — Z808 Family history of malignant neoplasm of other organs or systems: Secondary | ICD-10-CM | POA: Diagnosis not present

## 2020-09-12 DIAGNOSIS — L658 Other specified nonscarring hair loss: Secondary | ICD-10-CM | POA: Diagnosis not present

## 2020-09-13 DIAGNOSIS — M4125 Other idiopathic scoliosis, thoracolumbar region: Secondary | ICD-10-CM | POA: Diagnosis not present

## 2020-09-13 DIAGNOSIS — M1712 Unilateral primary osteoarthritis, left knee: Secondary | ICD-10-CM | POA: Diagnosis not present

## 2020-09-13 DIAGNOSIS — M5459 Other low back pain: Secondary | ICD-10-CM | POA: Diagnosis not present

## 2020-09-15 DIAGNOSIS — M1712 Unilateral primary osteoarthritis, left knee: Secondary | ICD-10-CM | POA: Diagnosis not present

## 2020-09-15 DIAGNOSIS — M4125 Other idiopathic scoliosis, thoracolumbar region: Secondary | ICD-10-CM | POA: Diagnosis not present

## 2020-09-15 DIAGNOSIS — M5459 Other low back pain: Secondary | ICD-10-CM | POA: Diagnosis not present

## 2020-09-25 DIAGNOSIS — M1712 Unilateral primary osteoarthritis, left knee: Secondary | ICD-10-CM | POA: Diagnosis not present

## 2020-09-25 DIAGNOSIS — M4125 Other idiopathic scoliosis, thoracolumbar region: Secondary | ICD-10-CM | POA: Diagnosis not present

## 2020-09-25 DIAGNOSIS — M5459 Other low back pain: Secondary | ICD-10-CM | POA: Diagnosis not present

## 2020-09-28 DIAGNOSIS — M5459 Other low back pain: Secondary | ICD-10-CM | POA: Diagnosis not present

## 2020-09-28 DIAGNOSIS — M4125 Other idiopathic scoliosis, thoracolumbar region: Secondary | ICD-10-CM | POA: Diagnosis not present

## 2020-09-28 DIAGNOSIS — M1712 Unilateral primary osteoarthritis, left knee: Secondary | ICD-10-CM | POA: Diagnosis not present

## 2020-10-02 DIAGNOSIS — M1712 Unilateral primary osteoarthritis, left knee: Secondary | ICD-10-CM | POA: Diagnosis not present

## 2020-10-02 DIAGNOSIS — M4125 Other idiopathic scoliosis, thoracolumbar region: Secondary | ICD-10-CM | POA: Diagnosis not present

## 2020-10-02 DIAGNOSIS — M5459 Other low back pain: Secondary | ICD-10-CM | POA: Diagnosis not present

## 2020-10-05 DIAGNOSIS — M5459 Other low back pain: Secondary | ICD-10-CM | POA: Diagnosis not present

## 2020-10-05 DIAGNOSIS — M1712 Unilateral primary osteoarthritis, left knee: Secondary | ICD-10-CM | POA: Diagnosis not present

## 2020-10-05 DIAGNOSIS — M4125 Other idiopathic scoliosis, thoracolumbar region: Secondary | ICD-10-CM | POA: Diagnosis not present

## 2020-10-09 DIAGNOSIS — M1712 Unilateral primary osteoarthritis, left knee: Secondary | ICD-10-CM | POA: Diagnosis not present

## 2020-10-09 DIAGNOSIS — M5459 Other low back pain: Secondary | ICD-10-CM | POA: Diagnosis not present

## 2020-10-09 DIAGNOSIS — M4125 Other idiopathic scoliosis, thoracolumbar region: Secondary | ICD-10-CM | POA: Diagnosis not present

## 2020-10-15 DIAGNOSIS — Z03818 Encounter for observation for suspected exposure to other biological agents ruled out: Secondary | ICD-10-CM | POA: Diagnosis not present

## 2020-10-15 DIAGNOSIS — Z20822 Contact with and (suspected) exposure to covid-19: Secondary | ICD-10-CM | POA: Diagnosis not present

## 2020-10-16 DIAGNOSIS — M5459 Other low back pain: Secondary | ICD-10-CM | POA: Diagnosis not present

## 2020-10-16 DIAGNOSIS — M1712 Unilateral primary osteoarthritis, left knee: Secondary | ICD-10-CM | POA: Diagnosis not present

## 2020-10-16 DIAGNOSIS — M4125 Other idiopathic scoliosis, thoracolumbar region: Secondary | ICD-10-CM | POA: Diagnosis not present

## 2020-10-18 DIAGNOSIS — M1712 Unilateral primary osteoarthritis, left knee: Secondary | ICD-10-CM | POA: Diagnosis not present

## 2020-10-18 DIAGNOSIS — M4125 Other idiopathic scoliosis, thoracolumbar region: Secondary | ICD-10-CM | POA: Diagnosis not present

## 2020-10-18 DIAGNOSIS — M5459 Other low back pain: Secondary | ICD-10-CM | POA: Diagnosis not present

## 2020-10-24 DIAGNOSIS — M5459 Other low back pain: Secondary | ICD-10-CM | POA: Diagnosis not present

## 2020-10-24 DIAGNOSIS — M1712 Unilateral primary osteoarthritis, left knee: Secondary | ICD-10-CM | POA: Diagnosis not present

## 2020-10-24 DIAGNOSIS — M4125 Other idiopathic scoliosis, thoracolumbar region: Secondary | ICD-10-CM | POA: Diagnosis not present

## 2020-11-02 DIAGNOSIS — H40023 Open angle with borderline findings, high risk, bilateral: Secondary | ICD-10-CM | POA: Diagnosis not present

## 2020-11-04 DIAGNOSIS — Z03818 Encounter for observation for suspected exposure to other biological agents ruled out: Secondary | ICD-10-CM | POA: Diagnosis not present

## 2020-11-04 DIAGNOSIS — Z20822 Contact with and (suspected) exposure to covid-19: Secondary | ICD-10-CM | POA: Diagnosis not present

## 2020-11-07 DIAGNOSIS — G40209 Localization-related (focal) (partial) symptomatic epilepsy and epileptic syndromes with complex partial seizures, not intractable, without status epilepticus: Secondary | ICD-10-CM | POA: Diagnosis not present

## 2020-11-07 DIAGNOSIS — F28 Other psychotic disorder not due to a substance or known physiological condition: Secondary | ICD-10-CM | POA: Diagnosis not present

## 2020-11-07 DIAGNOSIS — G2119 Other drug induced secondary parkinsonism: Secondary | ICD-10-CM | POA: Diagnosis not present

## 2020-11-07 DIAGNOSIS — Z9989 Dependence on other enabling machines and devices: Secondary | ICD-10-CM | POA: Diagnosis not present

## 2020-11-07 DIAGNOSIS — Z79899 Other long term (current) drug therapy: Secondary | ICD-10-CM | POA: Diagnosis not present

## 2020-11-07 DIAGNOSIS — G40909 Epilepsy, unspecified, not intractable, without status epilepticus: Secondary | ICD-10-CM | POA: Diagnosis not present

## 2020-11-07 DIAGNOSIS — G4733 Obstructive sleep apnea (adult) (pediatric): Secondary | ICD-10-CM | POA: Diagnosis not present

## 2020-11-07 DIAGNOSIS — G40109 Localization-related (focal) (partial) symptomatic epilepsy and epileptic syndromes with simple partial seizures, not intractable, without status epilepticus: Secondary | ICD-10-CM | POA: Diagnosis not present

## 2020-11-22 DIAGNOSIS — M1712 Unilateral primary osteoarthritis, left knee: Secondary | ICD-10-CM | POA: Diagnosis not present

## 2020-11-22 DIAGNOSIS — M4125 Other idiopathic scoliosis, thoracolumbar region: Secondary | ICD-10-CM | POA: Diagnosis not present

## 2020-11-22 DIAGNOSIS — M5459 Other low back pain: Secondary | ICD-10-CM | POA: Diagnosis not present

## 2020-11-29 DIAGNOSIS — M5459 Other low back pain: Secondary | ICD-10-CM | POA: Diagnosis not present

## 2020-11-29 DIAGNOSIS — M4125 Other idiopathic scoliosis, thoracolumbar region: Secondary | ICD-10-CM | POA: Diagnosis not present

## 2020-11-29 DIAGNOSIS — M1712 Unilateral primary osteoarthritis, left knee: Secondary | ICD-10-CM | POA: Diagnosis not present

## 2020-12-06 DIAGNOSIS — M1712 Unilateral primary osteoarthritis, left knee: Secondary | ICD-10-CM | POA: Diagnosis not present

## 2020-12-06 DIAGNOSIS — M25562 Pain in left knee: Secondary | ICD-10-CM | POA: Diagnosis not present

## 2020-12-06 DIAGNOSIS — M175 Other unilateral secondary osteoarthritis of knee: Secondary | ICD-10-CM | POA: Diagnosis not present

## 2020-12-06 DIAGNOSIS — M4125 Other idiopathic scoliosis, thoracolumbar region: Secondary | ICD-10-CM | POA: Diagnosis not present

## 2020-12-06 DIAGNOSIS — M25462 Effusion, left knee: Secondary | ICD-10-CM | POA: Diagnosis not present

## 2020-12-06 DIAGNOSIS — M5459 Other low back pain: Secondary | ICD-10-CM | POA: Diagnosis not present

## 2020-12-12 DIAGNOSIS — Z01419 Encounter for gynecological examination (general) (routine) without abnormal findings: Secondary | ICD-10-CM | POA: Diagnosis not present

## 2020-12-12 DIAGNOSIS — Z6825 Body mass index (BMI) 25.0-25.9, adult: Secondary | ICD-10-CM | POA: Diagnosis not present

## 2020-12-12 DIAGNOSIS — Z1231 Encounter for screening mammogram for malignant neoplasm of breast: Secondary | ICD-10-CM | POA: Diagnosis not present

## 2020-12-13 DIAGNOSIS — M4125 Other idiopathic scoliosis, thoracolumbar region: Secondary | ICD-10-CM | POA: Diagnosis not present

## 2020-12-13 DIAGNOSIS — M5459 Other low back pain: Secondary | ICD-10-CM | POA: Diagnosis not present

## 2020-12-13 DIAGNOSIS — M1712 Unilateral primary osteoarthritis, left knee: Secondary | ICD-10-CM | POA: Diagnosis not present

## 2021-01-29 DIAGNOSIS — Z23 Encounter for immunization: Secondary | ICD-10-CM | POA: Diagnosis not present

## 2021-02-05 DIAGNOSIS — S0181XA Laceration without foreign body of other part of head, initial encounter: Secondary | ICD-10-CM | POA: Diagnosis not present

## 2021-03-02 DIAGNOSIS — H40023 Open angle with borderline findings, high risk, bilateral: Secondary | ICD-10-CM | POA: Diagnosis not present

## 2021-04-11 DIAGNOSIS — Z79899 Other long term (current) drug therapy: Secondary | ICD-10-CM | POA: Diagnosis not present

## 2021-04-11 DIAGNOSIS — M25562 Pain in left knee: Secondary | ICD-10-CM | POA: Diagnosis not present

## 2021-05-18 DIAGNOSIS — D61818 Other pancytopenia: Secondary | ICD-10-CM | POA: Diagnosis not present

## 2021-05-18 DIAGNOSIS — E785 Hyperlipidemia, unspecified: Secondary | ICD-10-CM | POA: Diagnosis not present

## 2021-05-18 DIAGNOSIS — M21062 Valgus deformity, not elsewhere classified, left knee: Secondary | ICD-10-CM | POA: Diagnosis not present

## 2021-05-18 DIAGNOSIS — G40909 Epilepsy, unspecified, not intractable, without status epilepticus: Secondary | ICD-10-CM | POA: Diagnosis not present

## 2021-05-18 DIAGNOSIS — M170 Bilateral primary osteoarthritis of knee: Secondary | ICD-10-CM | POA: Diagnosis not present

## 2021-05-18 DIAGNOSIS — Z9181 History of falling: Secondary | ICD-10-CM | POA: Diagnosis not present

## 2021-05-18 DIAGNOSIS — L603 Nail dystrophy: Secondary | ICD-10-CM | POA: Diagnosis not present

## 2021-05-18 DIAGNOSIS — M8580 Other specified disorders of bone density and structure, unspecified site: Secondary | ICD-10-CM | POA: Diagnosis not present

## 2021-05-18 DIAGNOSIS — K219 Gastro-esophageal reflux disease without esophagitis: Secondary | ICD-10-CM | POA: Diagnosis not present

## 2021-05-18 DIAGNOSIS — M4155 Other secondary scoliosis, thoracolumbar region: Secondary | ICD-10-CM | POA: Diagnosis not present

## 2021-05-18 DIAGNOSIS — F29 Unspecified psychosis not due to a substance or known physiological condition: Secondary | ICD-10-CM | POA: Diagnosis not present

## 2021-05-24 ENCOUNTER — Other Ambulatory Visit: Payer: Self-pay

## 2021-05-24 ENCOUNTER — Ambulatory Visit (INDEPENDENT_AMBULATORY_CARE_PROVIDER_SITE_OTHER): Payer: Medicare Other | Admitting: Podiatry

## 2021-05-24 DIAGNOSIS — B353 Tinea pedis: Secondary | ICD-10-CM

## 2021-05-24 DIAGNOSIS — L603 Nail dystrophy: Secondary | ICD-10-CM

## 2021-05-24 DIAGNOSIS — L081 Erythrasma: Secondary | ICD-10-CM | POA: Diagnosis not present

## 2021-05-24 MED ORDER — KETOCONAZOLE 2 % EX CREA
1.0000 | TOPICAL_CREAM | Freq: Every day | CUTANEOUS | 2 refills | Status: DC
Start: 2021-05-24 — End: 2022-01-15

## 2021-05-24 MED ORDER — ERY 2 % EX PADS: 1.0000 "application " | MEDICATED_PAD | Freq: Every day | CUTANEOUS | 0 refills | Status: DC

## 2021-05-24 MED ORDER — NEOMYCIN-POLYMYXIN-HC 3.5-10000-1 OT SUSP: OTIC | 0 refills | Status: DC

## 2021-05-24 NOTE — Progress Notes (Signed)
  Subjective:  Patient ID: Andrea Crawford, female    DOB: 26-Dec-1955,  MRN: [id number]  Chief Complaint  Patient presents with   Nail Problem     np-left hallux toenail dystrophic and deformed-    65 y.o. female presents with the above complaint. History confirmed with patient.  She presents with her husband Andrea Crawford he also helps provide some of the history.  The left third toenail has bed thickened misshapen and very painful for several years.  The nail has come off before.  She has done quite a bit of dancing in the past and this may have contributed to it.  She also has a peeling rash on the inside of the lesser toes on the right foot  Objective:  Physical Exam: warm, good capillary refill, no trophic changes or ulcerative lesions, normal DP and PT pulses, and normal sensory exam. Left Foot: Severely dystrophic and mycotic hallux nail Right Foot: Scaling peeling rash at the digital sulcus and in between the toes on the right foot  Assessment:   1. Nail dystrophy   2. Tinea pedis, right   3. Erythrasma      Plan:  Patient was evaluated and treated and all questions answered.  We discussed options for treating the dystrophic toenail including maintenance with debridement periodically or total permanent nail avulsion.  They would like to pursue this route.  I discussed the pros and cons of this.  Following digital block with 3 cc of 1% lidocaine plain the left hallux nail plate was avulsed and 3 applications of phenol were applied to the nail matrix and bed.  She will return in 2 weeks for follow-up.  Cortisporin drops sent to pharmacy.  Discussed the etiology and treatment options for tinea pedis.  Discussed topical and oral treatment.  Recommended topical treatment with 2% ketoconazole cream as well as erythromycin pads to cover for erythrasma.  This was sent to the patient's pharmacy.  Also discussed appropriate foot hygiene, use of antifungal spray such as Tinactin in shoes, as well  as cleaning her foot surfaces such as showers and bathroom floors with bleach.    Return in about 2 weeks (around 06/07/2021) for nail re-check.

## 2021-05-24 NOTE — Patient Instructions (Signed)

## 2021-05-28 ENCOUNTER — Ambulatory Visit: Payer: Medicare Other | Admitting: Podiatry

## 2021-06-12 ENCOUNTER — Other Ambulatory Visit: Payer: Self-pay

## 2021-06-12 ENCOUNTER — Ambulatory Visit (INDEPENDENT_AMBULATORY_CARE_PROVIDER_SITE_OTHER): Payer: Medicare Other | Admitting: Podiatry

## 2021-06-12 DIAGNOSIS — B353 Tinea pedis: Secondary | ICD-10-CM

## 2021-06-12 DIAGNOSIS — L603 Nail dystrophy: Secondary | ICD-10-CM

## 2021-06-12 DIAGNOSIS — L081 Erythrasma: Secondary | ICD-10-CM

## 2021-06-14 ENCOUNTER — Telehealth: Payer: Self-pay | Admitting: Podiatry

## 2021-06-14 MED ORDER — ERY 2 % EX PADS: 1.0000 "application " | MEDICATED_PAD | Freq: Every day | CUTANEOUS | 2 refills | Status: DC

## 2021-06-14 NOTE — Progress Notes (Signed)
  Subjective:  Patient ID: Andrea Crawford, female    DOB: 1956-05-28,  MRN: [id number]  Chief Complaint  Patient presents with   Ingrown Toenail    Nail check left    65 y.o. female returns for follow-up with the above complaint. History confirmed with patient.  She presents with her husband Eddie Dibbles he also helps provide some of the history.  Overall doing well pain is improving  Objective:  Physical Exam: warm, good capillary refill, no trophic changes or ulcerative lesions, normal DP and PT pulses, and normal sensory exam. Left Foot: Matricectomy site is healing well Right Foot: Scaling peeling rash at the digital sulcus and in between the toes on the right foot  Assessment:   1. Nail dystrophy   2. Tinea pedis, right   3. Erythrasma       Plan:  Patient was evaluated and treated and all questions answered.  Matricectomy site is doing well can leave open air and discontinue soaks and ointment  Continue ketoconazole and erythromycin pads    Return if symptoms worsen or fail to improve.

## 2021-06-14 NOTE — Telephone Encounter (Signed)
Patient calling to request refill of Erythromycin (ERY) 2 % PADS . Please advise.

## 2021-06-14 NOTE — Addendum Note (Signed)
Addended bySherryle Lis, Collen Hostler R on: 06/14/2021 01:39 PM   Modules accepted: Orders

## 2021-06-28 DIAGNOSIS — M1712 Unilateral primary osteoarthritis, left knee: Secondary | ICD-10-CM | POA: Diagnosis not present

## 2021-07-11 DIAGNOSIS — Z23 Encounter for immunization: Secondary | ICD-10-CM | POA: Diagnosis not present

## 2021-07-16 DIAGNOSIS — Z1389 Encounter for screening for other disorder: Secondary | ICD-10-CM | POA: Diagnosis not present

## 2021-07-16 DIAGNOSIS — Z0000 Encounter for general adult medical examination without abnormal findings: Secondary | ICD-10-CM | POA: Diagnosis not present

## 2021-07-17 DIAGNOSIS — M8580 Other specified disorders of bone density and structure, unspecified site: Secondary | ICD-10-CM | POA: Diagnosis not present

## 2021-07-17 DIAGNOSIS — G40909 Epilepsy, unspecified, not intractable, without status epilepticus: Secondary | ICD-10-CM | POA: Diagnosis not present

## 2021-07-17 DIAGNOSIS — Z9181 History of falling: Secondary | ICD-10-CM | POA: Diagnosis not present

## 2021-07-17 DIAGNOSIS — K219 Gastro-esophageal reflux disease without esophagitis: Secondary | ICD-10-CM | POA: Diagnosis not present

## 2021-07-17 DIAGNOSIS — F29 Unspecified psychosis not due to a substance or known physiological condition: Secondary | ICD-10-CM | POA: Diagnosis not present

## 2021-07-17 DIAGNOSIS — E785 Hyperlipidemia, unspecified: Secondary | ICD-10-CM | POA: Diagnosis not present

## 2021-07-24 DIAGNOSIS — H40023 Open angle with borderline findings, high risk, bilateral: Secondary | ICD-10-CM | POA: Diagnosis not present

## 2021-08-21 DIAGNOSIS — T161XXA Foreign body in right ear, initial encounter: Secondary | ICD-10-CM | POA: Diagnosis not present

## 2021-08-28 DIAGNOSIS — S01311A Laceration without foreign body of right ear, initial encounter: Secondary | ICD-10-CM | POA: Diagnosis not present

## 2021-09-25 DIAGNOSIS — L818 Other specified disorders of pigmentation: Secondary | ICD-10-CM | POA: Diagnosis not present

## 2021-09-25 DIAGNOSIS — C44612 Basal cell carcinoma of skin of right upper limb, including shoulder: Secondary | ICD-10-CM | POA: Diagnosis not present

## 2021-09-25 DIAGNOSIS — D2261 Melanocytic nevi of right upper limb, including shoulder: Secondary | ICD-10-CM | POA: Diagnosis not present

## 2021-09-25 DIAGNOSIS — D485 Neoplasm of uncertain behavior of skin: Secondary | ICD-10-CM | POA: Diagnosis not present

## 2021-09-25 DIAGNOSIS — Z808 Family history of malignant neoplasm of other organs or systems: Secondary | ICD-10-CM | POA: Diagnosis not present

## 2021-09-25 DIAGNOSIS — L821 Other seborrheic keratosis: Secondary | ICD-10-CM | POA: Diagnosis not present

## 2021-09-25 DIAGNOSIS — D1801 Hemangioma of skin and subcutaneous tissue: Secondary | ICD-10-CM | POA: Diagnosis not present

## 2021-09-25 DIAGNOSIS — D225 Melanocytic nevi of trunk: Secondary | ICD-10-CM | POA: Diagnosis not present

## 2021-09-25 DIAGNOSIS — L578 Other skin changes due to chronic exposure to nonionizing radiation: Secondary | ICD-10-CM | POA: Diagnosis not present

## 2021-09-25 DIAGNOSIS — D220 Melanocytic nevi of lip: Secondary | ICD-10-CM | POA: Diagnosis not present

## 2021-10-10 DIAGNOSIS — H9193 Unspecified hearing loss, bilateral: Secondary | ICD-10-CM | POA: Diagnosis not present

## 2021-10-10 DIAGNOSIS — S01311A Laceration without foreign body of right ear, initial encounter: Secondary | ICD-10-CM | POA: Diagnosis not present

## 2021-10-10 DIAGNOSIS — G4733 Obstructive sleep apnea (adult) (pediatric): Secondary | ICD-10-CM | POA: Diagnosis not present

## 2021-10-15 DIAGNOSIS — H9190 Unspecified hearing loss, unspecified ear: Secondary | ICD-10-CM | POA: Diagnosis not present

## 2021-10-17 DIAGNOSIS — L03011 Cellulitis of right finger: Secondary | ICD-10-CM | POA: Diagnosis not present

## 2021-10-18 DIAGNOSIS — M25462 Effusion, left knee: Secondary | ICD-10-CM | POA: Diagnosis not present

## 2021-10-18 DIAGNOSIS — S83412A Sprain of medial collateral ligament of left knee, initial encounter: Secondary | ICD-10-CM | POA: Diagnosis not present

## 2021-10-31 DIAGNOSIS — H90A22 Sensorineural hearing loss, unilateral, left ear, with restricted hearing on the contralateral side: Secondary | ICD-10-CM | POA: Diagnosis not present

## 2021-10-31 DIAGNOSIS — H9042 Sensorineural hearing loss, unilateral, left ear, with unrestricted hearing on the contralateral side: Secondary | ICD-10-CM | POA: Diagnosis not present

## 2021-11-01 DIAGNOSIS — M1712 Unilateral primary osteoarthritis, left knee: Secondary | ICD-10-CM | POA: Diagnosis not present

## 2021-11-01 DIAGNOSIS — S61309D Unspecified open wound of unspecified finger with damage to nail, subsequent encounter: Secondary | ICD-10-CM | POA: Diagnosis not present

## 2021-11-27 DIAGNOSIS — H40023 Open angle with borderline findings, high risk, bilateral: Secondary | ICD-10-CM | POA: Diagnosis not present

## 2021-12-19 DIAGNOSIS — Z1231 Encounter for screening mammogram for malignant neoplasm of breast: Secondary | ICD-10-CM | POA: Diagnosis not present

## 2021-12-25 DIAGNOSIS — C44612 Basal cell carcinoma of skin of right upper limb, including shoulder: Secondary | ICD-10-CM | POA: Diagnosis not present

## 2021-12-27 DIAGNOSIS — F29 Unspecified psychosis not due to a substance or known physiological condition: Secondary | ICD-10-CM | POA: Insufficient documentation

## 2021-12-27 DIAGNOSIS — Z124 Encounter for screening for malignant neoplasm of cervix: Secondary | ICD-10-CM | POA: Diagnosis not present

## 2021-12-27 DIAGNOSIS — Z6827 Body mass index (BMI) 27.0-27.9, adult: Secondary | ICD-10-CM | POA: Diagnosis not present

## 2021-12-31 DIAGNOSIS — M25662 Stiffness of left knee, not elsewhere classified: Secondary | ICD-10-CM | POA: Diagnosis not present

## 2021-12-31 DIAGNOSIS — M1712 Unilateral primary osteoarthritis, left knee: Secondary | ICD-10-CM | POA: Diagnosis not present

## 2021-12-31 DIAGNOSIS — M25562 Pain in left knee: Secondary | ICD-10-CM | POA: Diagnosis not present

## 2022-01-07 NOTE — Progress Notes (Signed)
Surgery orders requested via Epic inbox. °

## 2022-01-09 NOTE — H&P (Signed)
TOTAL KNEE ADMISSION H&P ? ?Patient is being admitted for left total knee arthroplasty. ? ?Subjective: ? ?Chief Complaint: Left knee pain. ? ?HPI: Andrea Crawford, 66 y.o. female has a history of pain and functional disability in the left knee due to arthritis and has failed non-surgical conservative treatments for greater than 12 weeks to include NSAID's and/or analgesics, flexibility and strengthening excercises, and activity modification. Onset of symptoms was gradual, starting  several  years ago with gradually worsening course since that time. The patient noted no past surgery on the left knee.  Patient currently rates pain in the left knee at 7 out of 10 with activity. Patient has worsening of pain with activity and weight bearing, pain that interferes with activities of daily living, pain with passive range of motion, and crepitus. Patient has evidence of periarticular osteophytes and joint space narrowing by imaging studies. There is no active infection. ? ?Patient Active Problem List  ? Diagnosis Date Noted  ? Copper deficiency 06/28/2020  ? Other pancytopenia ([place]) 06/09/2020  ? Physical debility 06/09/2020  ? Pancytopenia, acquired ([place])   ? Pancytopenia ([place]) 05/31/2020  ? Schizophrenia ([place]) 05/31/2020  ? Volume overload 05/31/2020  ? Symptomatic anemia   ? Leucopenia 05/30/2020  ? Peripheral edema 05/30/2020  ? Epilepsy ([place]) 05/30/2020  ? Sleep apnea 05/30/2020  ? CHF (congestive heart failure) ([place]) 05/30/2020  ? Anemia 05/29/2020  ? ? ?Past Medical History:  ?Diagnosis Date  ? Drug-induced Parkinson's disease ([place])   ? History of complex partial epilepsy   ? Murmur, cardiac   ? Osteoarthritis   ? Osteopenia   ? Pedal edema   ? Seizure disorder ([place])   ? Seizures ([place])   ? ? ?Past Surgical History:  ?Procedure Laterality Date  ? ESOPHAGOGASTRODUODENOSCOPY    ? TONSILLECTOMY    ? ? ?Prior to Admission medications   ?Medication Sig Start Date End Date Taking? Authorizing Provider  ?acetaminophen  (TYLENOL) 500 MG tablet Take 1,000 mg by mouth every 6 (six) hours as needed for headache (pain).    [provider]  ?amantadine (SYMMETREL) 100 MG capsule Take 100 mg by mouth 2 (two) times daily.  01/10/19   [provider]  ?ARIPiprazole (ABILIFY) 20 MG tablet Take 20 mg by mouth daily.    [provider]  ?cloBAZam (ONFI) 10 MG tablet Take 5-15 mg by mouth See admin instructions. 0.5 tablet (5 MG) in the morning and 1.5 tablets (15 MG) in the evening    [provider]  ?Copper Gluconate 2 MG TABS Take 7 mg by mouth daily. ?Patient not taking: Reported on 05/24/2021    [provider]  ?Erythromycin (ERY) 2 % PADS Apply 1 application topically daily. Apply between toes and under toes 06/14/21   Criselda Peaches, DPM  ?escitalopram (LEXAPRO) 10 MG tablet  12/04/20   [provider]  ?ketoconazole (NIZORAL) 2 % cream Apply 1 application topically daily. 05/24/21   Criselda Peaches, DPM  ?lacosamide (VIMPAT) 50 MG TABS tablet Take 50-150 mg by mouth See admin instructions. Take 1 tablet (50 mg) by mouth with breakfast and lunch, take 3 tablets (150 mg) at bedtime    [provider]  ?lamoTRIgine (LAMICTAL) 200 MG tablet Take 200-400 mg by mouth See admin instructions. Take one tablet (200 mg) by mouth with breakfast and lunch, take two tablets (400 mg) at bedtime    [provider]  ?levETIRAcetam (KEPPRA) 1000 MG tablet Take 500-1,000 mg by mouth See  admin instructions. Take 1/2 tablet (500 mg) by mouth with breakfast and lunch, take 1 tablet (1000 mg) at bedtime    [provider]  ?neomycin-polymyxin-hydrocortisone (CORTISPORIN) 3.5-10000-1 OTIC suspension Apply 1-2 drops daily after soaking and cover with bandaid 05/24/21   McDonald, Stephan Minister, DPM  ?omeprazole (PRILOSEC) 40 MG capsule Take 40 mg by mouth at bedtime. 01/10/19   [provider]  ?[place] into the lungs at bedtime. CPAP    [provider]  ?vitamin B-12 (CYANOCOBALAMIN) 1000 MCG tablet Take 1 tablet (1,000 mcg total) by mouth daily. ?Patient not taking: Reported on 05/24/2021 06/05/20   Eugenie Filler, MD  ? ? ?No Known Allergies ? ?Social History  ? ?Socioeconomic History  ? Marital status: Married  ?  Spouse name: Not on file  ? Number of children: 4  ? Years of education: Not on file  ? Highest education level: Not on file  ?Occupational History  ? Not on file  ?Tobacco Use  ? Smoking status: Never  ? Smokeless tobacco: Never  ?Substance and Sexual Activity  ? Alcohol use: No  ? Drug use: No  ? Sexual activity: Not on file  ?Other Topics Concern  ? Not on file  ?Social History Narrative  ? Not on file  ? ?Social Determinants of Health  ? ?Financial Resource Strain: Not on file  ?Food Insecurity: Not on file  ?Transportation Needs: Not on file  ?Physical Activity: Not on file  ?Stress: Not on file  ?Social Connections: Not on file  ?Intimate Partner Violence: Not on file  ? ? ?Tobacco Use: Not on file  ? ?Social History  ? ?Substance and Sexual Activity  ?Alcohol Use No  ? ? ?Family History  ?Problem Relation Age of Onset  ? Arthritis/Rheumatoid Mother   ? Melanoma Sister   ? Melanoma Brother   ? Lung cancer Brother   ? Breast cancer Paternal Aunt   ? Heart attack Father   ? Diabetes Sister   ? Seizures Neg Hx   ? ? ?ROS: ?Constitutional: no fever, no chills, no night sweats, no significant weight loss ?Cardiovascular: no chest pain, no palpitations ?Respiratory: no cough, no shortness of breath, No COPD ?Gastrointestinal: no vomiting, no nausea ?Musculoskeletal: no swelling in Joints, Joint Pain ?Neurologic: no numbness, no tingling, no difficulty with balance ? ? ?Objective: ? ?Physical Exam: ?Well nourished and well developed.  ?General: Alert and oriented x3, cooperative and pleasant, no acute distress.  ?Head: normocephalic, atraumatic, neck supple.  ?Eyes: EOMI.  ?Respiratory: breath sounds clear in all fields, no wheezing, rales, or  rhonchi. ?Cardiovascular: Regular rate and rhythm, no murmurs, gallops or rubs.  ?Abdomen: non-tender to palpation and soft, normoactive bowel sounds. ?Musculoskeletal: ? ? ? Left Knee Exam: ? Significant valgus deformity that is fixed at approximately 20 degrees. ? No effusion. ? Range of motion: 5 to 120 degrees. ? Positive crepitus on range of motion of the knee. ? No medial or lateral joint line tenderness. ? Stable knee. ? ?Calves soft and nontender. Motor function intact in LE. Strength 5/5 LE bilaterally. ?Neuro: Distal pulses 2+. Sensation to light touch intact in LE. ? ?  ? ?Vital signs in last 24 hours: ?BP: ()/()  ?Arterial Line BP: ()/()  ? ?Imaging Review ? ?AP and lateral of the bilateral knees dated 09/2021 demonstrate severe bone-on-bone arthritis in the lateral and patellofemoral compartments of the left knee. The right knee looks normal. She has approximately 10 to 15  degree valgus deformity on the x-rays. ? ?Assessment/Plan: ? ?End stage arthritis, left knee  ? ?The patient history, physical examination, clinical judgment of the provider and imaging studies are consistent with end stage degenerative joint disease of the left knee and total knee arthroplasty is deemed medically necessary. The treatment options including medical management, injection therapy arthroscopy and arthroplasty were discussed at length. The risks and benefits of total knee arthroplasty were presented and reviewed. The risks due to aseptic loosening, infection, stiffness, patella tracking problems, thromboembolic complications and other imponderables were discussed. The patient acknowledged the explanation, agreed to proceed with the plan and consent was signed. Patient is being admitted for inpatient treatment for surgery, pain control, PT, OT, prophylactic antibiotics, VTE prophylaxis, progressive ambulation and ADLs and discharge planning. The patient is planning to be discharged  home . ? ? ?Patient's anticipated LOS is  less than 2 midnights, meeting these requirements: ?- Younger than 2 ?- Lives within 1 hour of care ?- Has a competent adult at home to recover with post-op recover ?- NO history of ? - Chronic pain requiring

## 2022-01-11 DIAGNOSIS — M170 Bilateral primary osteoarthritis of knee: Secondary | ICD-10-CM | POA: Diagnosis not present

## 2022-01-11 DIAGNOSIS — Z01818 Encounter for other preprocedural examination: Secondary | ICD-10-CM | POA: Diagnosis not present

## 2022-01-15 NOTE — Patient Instructions (Addendum)
DUE TO COVID-19 ONLY ONE VISITOR  (aged 68 and older)  IS ALLOWED TO COME WITH YOU AND STAY IN THE WAITING ROOM ONLY DURING PRE OP AND PROCEDURE.   ?**NO VISITORS ARE ALLOWED IN THE SHORT STAY AREA OR RECOVERY ROOM!!** ? ?IF YOU WILL BE ADMITTED INTO THE HOSPITAL YOU ARE ALLOWED ONLY TWO SUPPORT PEOPLE DURING VISITATION HOURS ONLY (7 AM -8PM)   ?The support person(s) must pass our screening, gel in and out, and wear a mask at all times, including in the patient?s room. ?Patients must also wear a mask when staff or their support person are in the room. ?Visitors GUEST BADGE MUST BE WORN VISIBLY  ?One adult visitor may remain with you overnight and MUST be in the room by 8 P.M. ?  ? ? Your procedure is scheduled on: 01/28/22 ? ? Report to [facility] Main Entrance ? ?  Report to admitting at 9:55 AM ? ? Call this number if you have problems the morning of surgery [phone] ? ? Do not eat food :After Midnight. ? ? After Midnight you may have the following liquids until _9:30_____ AM/ PM DAY OF SURGERY ? ?Water ?Black Coffee (sugar ok, NO MILK/CREAM OR CREAMERS)  ?Tea (sugar ok, NO MILK/CREAM OR CREAMERS) regular and decaf                             ?Plain Jell-O (NO RED)                                           ?Fruit ices (not with fruit pulp, NO RED)                                     ?Popsicles (NO RED)                                                                  ?Juice: apple, WHITE grape, WHITE cranberry ?Sports drinks like Gatorade (NO RED) ?Clear broth(vegetable,chicken,beef) ? ?             ? ?  ?  ?The day of surgery:  ?Drink ONE (1) Pre-Surgery Clear Ensure at 9:15 AM the morning of surgery. Drink in one sitting. Do not sip.  ?This drink was given to you during your hospital  ?pre-op appointment visit. ?Nothing else to drink after completing the  ?Pre-Surgery Clear Ensure at 9:30 AM ?  ?       If you have questions, please contact your surgeon?s office. ? ? ?FOLLOW BOWEL PREP AND ANY  ADDITIONAL PRE OP INSTRUCTIONS YOU RECEIVED FROM YOUR SURGEON'S OFFICE!!! ?  ?  ?Oral Hygiene is also important to reduce your risk of infection.                                    ?Remember - BRUSH YOUR TEETH THE MORNING OF SURGERY WITH YOUR REGULAR TOOTHPASTE ? ? Do NOT smoke after Midnight ? ?  Take these medicines the morning of surgery with A SIP OF WATER: Clobazam, Lacosamide, Lamotragine, Symmetrel (amantadine) Aripiprazole,     Omeprazole,Keppra ?              ? ?Bring your mask and tubing to the hospital ?                  ?           You may not have any metal on your body including hair pins, jewelry, and body piercing ? ?           Do not wear make-up, lotions, powders, perfumes/cologne, or deodorant ? ?Do not wear nail polish including gel and S&S, artificial/acrylic nails, or any other type of covering on natural nails including finger and toenails. If you have artificial nails, gel coating, etc. that needs to be removed by a nail salon please have this removed prior to surgery or surgery may need to be canceled/ delayed if the surgeon/ anesthesia feels like they are unable to be safely monitored.  ? ?Do not shave  48 hours prior to surgery.  ? ? ? Do not bring valuables to the hospital. [facility] NOT ?            RESPONSIBLE   FOR VALUABLES. ? ? Contacts, dentures or bridgework may not be worn into surgery. ? ? Bring small overnight bag day of surgery. ?  ? Patients discharged on the day of surgery will not be allowed to drive home.  Someone NEEDS to stay with you for the first 24 hours after anesthesia. ? ? Special Instructions: Bring a copy of your healthcare power of attorney and living will documents   the day of surgery if you haven't scanned them before. ? ?            Please read over the following fact sheets you were given: IF [place] [phone] ? ?   [place] - Preparing for Surgery ?Before surgery, you can play an important  role.  Because skin is not sterile, your skin needs to be as free of germs as possible.  You can reduce the number of germs on your skin by washing with CHG (chlorahexidine gluconate) soap before surgery.  CHG is an antiseptic cleaner which kills germs and bonds with the skin to continue killing germs even after washing. ?Please DO NOT use if you have an allergy to CHG or antibacterial soaps.  If your skin becomes reddened/irritated stop using the CHG and inform your nurse when you arrive at Short Stay. ?Do not shave (including legs and underarms) for at least 48 hours prior to the first CHG shower.  ?Please follow these instructions carefully: ? 1.  Shower with CHG Soap the night before surgery and the  morning of Surgery. ? 2.  If you choose to wash your hair, wash your hair first as usual with your  normal  shampoo. ? 3.  After you shampoo, rinse your hair and body thoroughly to remove the  shampoo.                          ?  4.  Use CHG as you would any other liquid soap.  You can apply chg directly  to the skin and wash  ?  Gently with a scrungie or clean washcloth. ? 5.  Apply the CHG Soap to your body ONLY FROM THE NECK DOWN.   Do not use on face/ open      ?                     Wound or open sores. Avoid contact with eyes, ears mouth and genitals (private parts).  ?                     Production manager,  Genitals (private parts) with your normal soap. ?            6.  Wash thoroughly, paying special attention to the area where your surgery  will be performed. ? 7.  Thoroughly rinse your body with warm water from the neck down. ? 8.  DO NOT shower/wash with your normal soap after using and rinsing off  the CHG Soap. ?               9.  Pat yourself dry with a clean towel. ?           10.  Wear clean pajamas. ?           11.  Place clean sheets on your bed the night of your first shower and do not  sleep with pets. ?Day of Surgery : ?Do not apply any lotions/deodorants the morning of surgery.   Please wear clean clothes to the hospital/surgery center. ? ?FAILURE TO FOLLOW THESE INSTRUCTIONS MAY RESULT IN THE CANCELLATION OF YOUR SURGERY ? ? ?________________________________________________________________________  ? ?Incentive Spirometer ? ?An incentive spirometer is a tool that can help keep your lungs clear and active. This tool measures how well you are filling your lungs with each breath. Taking long deep breaths may help reverse or decrease the chance of developing breathing (pulmonary) problems (especially infection) following: ?A long period of time when you are unable to move or be active. ?BEFORE THE PROCEDURE  ?If the spirometer includes an indicator to show your best effort, your nurse or respiratory therapist will set it to a desired goal. ?If possible, sit up straight or lean slightly forward. Try not to slouch. ?Hold the incentive spirometer in an upright position. ?INSTRUCTIONS FOR USE  ?Sit on the edge of your bed if possible, or sit up as far as you can in bed or on a chair. ?Hold the incentive spirometer in an upright position. ?Breathe out normally. ?Place the mouthpiece in your mouth and seal your lips tightly around it. ?Breathe in slowly and as deeply as possible, raising the piston or the ball toward the top of the column. ?Hold your breath for 3-5 seconds or for as long as possible. Allow the piston or ball to fall to the bottom of the column. ?Remove the mouthpiece from your mouth and breathe out normally. ?Rest for a few seconds and repeat Steps 1 through 7 at least 10 times every 1-2 hours when you are awake. Take your time and take a few normal breaths between deep breaths. ?The spirometer may include an indicator to show your best effort. Use the indicator as a goal to work toward during each repetition. ?After each set of 10 deep breaths, practice coughing to be sure your lungs are clear. If you have an incision (the cut made at the time of surgery), support your incision when  coughing by placing a pillow or rolled up towels firmly against it. ?Once  you are able to get out of bed, walk around indoors and cough well. You may stop using the incentive spirometer when instructed by your care

## 2022-01-16 ENCOUNTER — Encounter (HOSPITAL_COMMUNITY): Payer: Self-pay

## 2022-01-16 ENCOUNTER — Encounter (HOSPITAL_COMMUNITY)
Admission: RE | Admit: 2022-01-16 | Discharge: 2022-01-16 | Disposition: A | Discharge status: Home / Self Care | Payer: Medicare Other | Source: Ambulatory Visit | Attending: Orthopedic Surgery | Admitting: Orthopedic Surgery

## 2022-01-16 ENCOUNTER — Other Ambulatory Visit: Payer: Self-pay

## 2022-01-16 VITALS — BP 131/76 | HR 58 | Temp 97.6°F | Resp 16 | Ht 62.0 in | Wt 139.0 lb

## 2022-01-16 DIAGNOSIS — Z01818 Encounter for other preprocedural examination: Secondary | ICD-10-CM

## 2022-01-16 DIAGNOSIS — M1712 Unilateral primary osteoarthritis, left knee: Secondary | ICD-10-CM | POA: Diagnosis not present

## 2022-01-16 LAB — CBC
HCT: 42.5 % (ref 36.0–46.0)
Hemoglobin: 13.6 g/dL (ref 12.0–15.0)
MCH: 30.4 pg (ref 26.0–34.0)
MCHC: 32 g/dL (ref 30.0–36.0)
MCV: 95.1 fL (ref 80.0–100.0)
Platelets: 185 10*3/uL (ref 150–400)
RBC: 4.47 MIL/uL (ref 3.87–5.11)
RDW: 13.3 % (ref 11.5–15.5)
WBC: 4.6 10*3/uL (ref 4.0–10.5)
nRBC: 0 % (ref 0.0–0.2)

## 2022-01-16 LAB — SURGICAL PCR SCREEN
MRSA, PCR: NEGATIVE
Staphylococcus aureus: NEGATIVE

## 2022-01-16 LAB — COMPREHENSIVE METABOLIC PANEL
ALT: 16 U/L (ref 0–44)
AST: 18 U/L (ref 15–41)
Albumin: 4.3 g/dL (ref 3.5–5.0)
Alkaline Phosphatase: 107 U/L (ref 38–126)
Anion gap: 5 (ref 5–15)
BUN: 14 mg/dL (ref 8–23)
CO2: 33 mmol/L — ABNORMAL HIGH (ref 22–32)
Calcium: 9.3 mg/dL (ref 8.9–10.3)
Chloride: 100 mmol/L (ref 98–111)
Creatinine, Ser: 0.79 mg/dL (ref 0.44–1.00)
GFR, Estimated: >60 mL/min (ref >60)
Glucose, Bld: 91 mg/dL (ref 70–99)
Potassium: 4.5 mmol/L (ref 3.5–5.1)
Sodium: 138 mmol/L (ref 135–145)
Total Bilirubin: 0.6 mg/dL (ref 0.3–1.2)
Total Protein: 7.1 g/dL (ref 6.5–8.1)

## 2022-01-16 NOTE — Progress Notes (Signed)
?  Bowel prep reminder:NA ? ?PCP - Marda Stalker PA ?Cardiologist - none ?Neurologist - Dr. Yvonne Kendall [facility] ? ?Chest x-ray - no ?EKG - 01/16/22-chart ?Stress Test - no ?ECHO - 05/30/20-epic ?Cardiac Cath - no ?Pacemaker/ICD device last checked:NA ? ?Sleep Study - yes ?CPAP - yes ? ?Fasting Blood Sugar - NA ?Checks Blood Sugar _____ times a day ? ?Blood Thinner Instructions:NA ?Last Dose: ? ?Anesthesia review: yes ? ?Patient denies shortness of breath, fever, cough and chest pain at PAT appointment ?Pt has Parkinson like symptom from her meds. She uses a walker and her husband is very involved in her care. ? ?Patient verbalized understanding of instructions that were given to them at the PAT appointment. Patient was also instructed that they will need to review over the PAT instructions again at home before surgery. Yes. A copy of instructions was given to Pt and her husband ?

## 2022-01-27 NOTE — Anesthesia Preprocedure Evaluation (Addendum)
Anesthesia Evaluation  ?Patient identified by MRN, date of birth, ID band ?Patient awake ? ? ? ?Reviewed: ?Allergy & Precautions, NPO status , Patient's Chart, lab work & pertinent test results ? ?Airway ?Mallampati: II ? ?TM Distance: >3 FB ?Neck ROM: Full ? ? ? Dental ?no notable dental hx. ?(+) Teeth Intact, Dental Advisory Given ?  ?Pulmonary ? ?  ?Pulmonary exam normal ?breath sounds clear to auscultation ? ? ? ? ? ? Cardiovascular ?Exercise Tolerance: Good ?Normal cardiovascular exam ?Rhythm:Regular Rate:Normal ? ?05/2020 TTE EF 60-65% ?  ?Neuro/Psych ?Seizures -, Well Controlled,  Schizophrenia   ? GI/Hepatic ?Neg liver ROS,   ?Endo/Other  ?negative endocrine ROS ? Renal/GU ?Lab Results ?     Component                Value               Date                 ?     CREATININE               0.79                01/16/2022           ?     K                        4.5                 01/16/2022           ?        ?  ? ?  ?Musculoskeletal ? ?(+) Arthritis ,  ? Abdominal ?  ?Peds ? Hematology ?Lab Results ?     Component                Value               Date                 ?     HGB                      13.6                01/16/2022                  ?     PLT                      185                 01/16/2022           ?   ?Anesthesia Other Findings ?NKA ? Reproductive/Obstetrics ? ?  ? ? ? ? ? ? ? ? ? ? ? ? ? ?  ?  ? ? ? ? ? ? ? ?Anesthesia Physical ?Anesthesia Plan ? ?ASA: 3 ? ?Anesthesia Plan: Spinal and Regional  ? ?Post-op Pain Management: Regional block* and Minimal or no pain anticipated  ? ?Induction:  ? ?PONV Risk Score and Plan: Treatment may vary due to age or medical condition ? ?Airway Management Planned: Natural Airway and Nasal Cannula ? ?Additional Equipment: None ? ?Intra-op Plan:  ? ?Post-operative Plan: Extubation in OR ? ?Informed Consent:  ? ? ? ?Dental advisory given ? ?Plan Discussed with:  ? ?Anesthesia Plan Comments: (Sp w L adductor  canal block)   ? ? ? ? ? ? ?Anesthesia Quick Evaluation ? ?

## 2022-01-28 ENCOUNTER — Other Ambulatory Visit: Payer: Self-pay

## 2022-01-28 ENCOUNTER — Ambulatory Visit (HOSPITAL_COMMUNITY): Payer: Medicare Other | Admitting: Physician Assistant

## 2022-01-28 ENCOUNTER — Observation Stay (HOSPITAL_COMMUNITY)
Admission: RE | Admit: 2022-01-28 | Discharge: 2022-01-29 | Disposition: A | Discharge status: Home / Self Care | Payer: Medicare Other | Source: Ambulatory Visit | Attending: Orthopedic Surgery | Admitting: Orthopedic Surgery

## 2022-01-28 ENCOUNTER — Encounter (HOSPITAL_COMMUNITY)
Admission: RE | Disposition: A | Discharge status: Home / Self Care | Payer: Self-pay | Source: Ambulatory Visit | Attending: Orthopedic Surgery

## 2022-01-28 ENCOUNTER — Ambulatory Visit (HOSPITAL_BASED_OUTPATIENT_CLINIC_OR_DEPARTMENT_OTHER): Payer: Medicare Other | Admitting: Anesthesiology

## 2022-01-28 ENCOUNTER — Encounter (HOSPITAL_COMMUNITY): Payer: Self-pay | Admitting: Orthopedic Surgery

## 2022-01-28 DIAGNOSIS — M1712 Unilateral primary osteoarthritis, left knee: Principal | ICD-10-CM | POA: Insufficient documentation

## 2022-01-28 DIAGNOSIS — G4733 Obstructive sleep apnea (adult) (pediatric): Secondary | ICD-10-CM | POA: Insufficient documentation

## 2022-01-28 DIAGNOSIS — F209 Schizophrenia, unspecified: Secondary | ICD-10-CM | POA: Diagnosis not present

## 2022-01-28 DIAGNOSIS — G8918 Other acute postprocedural pain: Secondary | ICD-10-CM | POA: Diagnosis not present

## 2022-01-28 DIAGNOSIS — D61818 Other pancytopenia: Secondary | ICD-10-CM | POA: Diagnosis not present

## 2022-01-28 DIAGNOSIS — I509 Heart failure, unspecified: Secondary | ICD-10-CM | POA: Insufficient documentation

## 2022-01-28 DIAGNOSIS — M179 Osteoarthritis of knee, unspecified: Secondary | ICD-10-CM

## 2022-01-28 HISTORY — PX: TOTAL KNEE ARTHROPLASTY: SHX125

## 2022-01-28 SURGERY — ARTHROPLASTY, KNEE, TOTAL
Anesthesia: Regional | Site: Knee | Laterality: Left

## 2022-01-28 MED ORDER — CEFAZOLIN SODIUM-DEXTROSE 2-4 GM/100ML-% IV SOLN
2.0000 g | Freq: Four times a day (QID) | INTRAVENOUS | Status: AC
  Administered 2022-01-28 – 2022-01-29 (×2): 2 g via INTRAVENOUS
  Filled 2022-01-28 (×2): qty 100

## 2022-01-28 MED ORDER — GLYCOPYRROLATE 0.2 MG/ML IJ SOLN
INTRAMUSCULAR | Status: DC | PRN
  Administered 2022-01-28 (×2): .2 mg via INTRAVENOUS

## 2022-01-28 MED ORDER — BUPIVACAINE LIPOSOME 1.3 % IJ SUSP
INTRAMUSCULAR | Status: AC
  Filled 2022-01-28: qty 20

## 2022-01-28 MED ORDER — ONDANSETRON HCL 4 MG/2ML IJ SOLN
4.0000 mg | Freq: Four times a day (QID) | INTRAMUSCULAR | Status: DC | PRN
Start: 2022-01-28 — End: 2022-01-29

## 2022-01-28 MED ORDER — DEXAMETHASONE SODIUM PHOSPHATE 10 MG/ML IJ SOLN
INTRAMUSCULAR | Status: AC
  Filled 2022-01-28: qty 1

## 2022-01-28 MED ORDER — ESCITALOPRAM OXALATE 10 MG PO TABS
10.0000 mg | ORAL_TABLET | Freq: Every day | ORAL | Status: DC
  Administered 2022-01-28: 10 mg via ORAL
  Filled 2022-01-28: qty 1

## 2022-01-28 MED ORDER — AMANTADINE HCL 100 MG PO CAPS
100.0000 mg | ORAL_CAPSULE | Freq: Two times a day (BID) | ORAL | Status: DC
  Administered 2022-01-28 – 2022-01-29 (×2): 100 mg via ORAL
  Filled 2022-01-28 (×2): qty 1

## 2022-01-28 MED ORDER — ONDANSETRON HCL 4 MG/2ML IJ SOLN
INTRAMUSCULAR | Status: AC
  Filled 2022-01-28: qty 2

## 2022-01-28 MED ORDER — DOCUSATE SODIUM 100 MG PO CAPS
100.0000 mg | ORAL_CAPSULE | Freq: Two times a day (BID) | ORAL | Status: DC
  Administered 2022-01-28 – 2022-01-29 (×2): 100 mg via ORAL
  Filled 2022-01-28 (×2): qty 1

## 2022-01-28 MED ORDER — DEXAMETHASONE SODIUM PHOSPHATE 10 MG/ML IJ SOLN
8.0000 mg | Freq: Once | INTRAMUSCULAR | Status: AC
  Administered 2022-01-28: 8 mg via INTRAVENOUS

## 2022-01-28 MED ORDER — LACTATED RINGERS IV SOLN: INTRAVENOUS | Status: DC

## 2022-01-28 MED ORDER — TRANEXAMIC ACID-NACL 1000-0.7 MG/100ML-% IV SOLN
1000.0000 mg | INTRAVENOUS | Status: AC
  Administered 2022-01-28: 1000 mg via INTRAVENOUS
  Filled 2022-01-28: qty 100

## 2022-01-28 MED ORDER — BISACODYL 10 MG RE SUPP: 10.0000 mg | Freq: Every day | RECTAL | Status: DC | PRN

## 2022-01-28 MED ORDER — PROPOFOL 10 MG/ML IV BOLUS
INTRAVENOUS | Status: DC | PRN
  Administered 2022-01-28: 40 mg via INTRAVENOUS

## 2022-01-28 MED ORDER — METOCLOPRAMIDE HCL 5 MG PO TABS: 5.0000 mg | ORAL_TABLET | Freq: Three times a day (TID) | ORAL | Status: DC | PRN

## 2022-01-28 MED ORDER — BUPIVACAINE LIPOSOME 1.3 % IJ SUSP: 20.0000 mL | Freq: Once | INTRAMUSCULAR | Status: DC

## 2022-01-28 MED ORDER — CLONIDINE HCL (ANALGESIA) 100 MCG/ML EP SOLN
EPIDURAL | Status: DC | PRN
  Administered 2022-01-28: 100 ug

## 2022-01-28 MED ORDER — CLOBAZAM 10 MG PO TABS
5.0000 mg | ORAL_TABLET | Freq: Every day | ORAL | Status: DC
  Administered 2022-01-29: 5 mg via ORAL
  Filled 2022-01-28: qty 1

## 2022-01-28 MED ORDER — PHENYLEPHRINE HCL-NACL 20-0.9 MG/250ML-% IV SOLN
INTRAVENOUS | Status: DC | PRN
  Administered 2022-01-28: 25 ug/min via INTRAVENOUS

## 2022-01-28 MED ORDER — EPHEDRINE 5 MG/ML INJ
INTRAVENOUS | Status: AC
  Filled 2022-01-28: qty 5

## 2022-01-28 MED ORDER — DIPHENHYDRAMINE HCL 12.5 MG/5ML PO ELIX: 12.5000 mg | ORAL_SOLUTION | ORAL | Status: DC | PRN

## 2022-01-28 MED ORDER — METHOCARBAMOL 500 MG PO TABS
500.0000 mg | ORAL_TABLET | Freq: Four times a day (QID) | ORAL | Status: DC | PRN
  Administered 2022-01-28: 500 mg via ORAL
  Filled 2022-01-28: qty 1

## 2022-01-28 MED ORDER — STERILE WATER FOR IRRIGATION IR SOLN
Status: DC | PRN
  Administered 2022-01-28: 2000 mL

## 2022-01-28 MED ORDER — SODIUM CHLORIDE 0.9 % IV SOLN: INTRAVENOUS | Status: DC

## 2022-01-28 MED ORDER — PHENOL 1.4 % MT LIQD: 1.0000 | OROMUCOSAL | Status: DC | PRN

## 2022-01-28 MED ORDER — METOCLOPRAMIDE HCL 5 MG/ML IJ SOLN: 5.0000 mg | Freq: Three times a day (TID) | INTRAMUSCULAR | Status: DC | PRN

## 2022-01-28 MED ORDER — DEXAMETHASONE SODIUM PHOSPHATE 10 MG/ML IJ SOLN: 10.0000 mg | Freq: Once | INTRAMUSCULAR | Status: DC

## 2022-01-28 MED ORDER — BUPIVACAINE LIPOSOME 1.3 % IJ SUSP
INTRAMUSCULAR | Status: DC | PRN
  Administered 2022-01-28: 20 mL

## 2022-01-28 MED ORDER — ORAL CARE MOUTH RINSE: 15.0000 mL | Freq: Once | OROMUCOSAL | Status: AC

## 2022-01-28 MED ORDER — POVIDONE-IODINE 10 % EX SWAB
2.0000 "application " | Freq: Once | CUTANEOUS | Status: AC
  Administered 2022-01-28: 2 via TOPICAL

## 2022-01-28 MED ORDER — MENTHOL 3 MG MT LOZG: 1.0000 | LOZENGE | OROMUCOSAL | Status: DC | PRN

## 2022-01-28 MED ORDER — LAMOTRIGINE 100 MG PO TABS
400.0000 mg | ORAL_TABLET | Freq: Every day | ORAL | Status: DC
  Administered 2022-01-28: 400 mg via ORAL
  Filled 2022-01-28: qty 4

## 2022-01-28 MED ORDER — ONDANSETRON HCL 4 MG PO TABS: 4.0000 mg | ORAL_TABLET | Freq: Four times a day (QID) | ORAL | Status: DC | PRN

## 2022-01-28 MED ORDER — ACETAMINOPHEN 500 MG PO TABS
1000.0000 mg | ORAL_TABLET | Freq: Four times a day (QID) | ORAL | Status: DC
  Administered 2022-01-28 – 2022-01-29 (×3): 1000 mg via ORAL
  Filled 2022-01-28 (×3): qty 2

## 2022-01-28 MED ORDER — MORPHINE SULFATE (PF) 2 MG/ML IV SOLN: 1.0000 mg | INTRAVENOUS | Status: DC | PRN

## 2022-01-28 MED ORDER — PROPOFOL 1000 MG/100ML IV EMUL
INTRAVENOUS | Status: AC
  Filled 2022-01-28: qty 100

## 2022-01-28 MED ORDER — PANTOPRAZOLE SODIUM 40 MG PO TBEC
80.0000 mg | DELAYED_RELEASE_TABLET | Freq: Every day | ORAL | Status: DC
Start: 2022-01-29 — End: 2022-01-29
  Administered 2022-01-29: 80 mg via ORAL
  Filled 2022-01-28: qty 2

## 2022-01-28 MED ORDER — LACOSAMIDE 50 MG PO TABS
150.0000 mg | ORAL_TABLET | Freq: Every day | ORAL | Status: DC
  Administered 2022-01-28: 150 mg via ORAL
  Filled 2022-01-28: qty 3

## 2022-01-28 MED ORDER — PROPOFOL 500 MG/50ML IV EMUL
INTRAVENOUS | Status: DC | PRN
  Administered 2022-01-28: 75 ug/kg/min via INTRAVENOUS

## 2022-01-28 MED ORDER — MIDAZOLAM HCL 2 MG/2ML IJ SOLN
1.0000 mg | INTRAMUSCULAR | Status: DC
  Administered 2022-01-28: 1 mg via INTRAVENOUS
  Filled 2022-01-28: qty 2

## 2022-01-28 MED ORDER — FLEET ENEMA 7-19 GM/118ML RE ENEM
1.0000 | ENEMA | Freq: Once | RECTAL | Status: DC | PRN
Start: 2022-01-28 — End: 2022-01-29

## 2022-01-28 MED ORDER — LACOSAMIDE 50 MG PO TABS
50.0000 mg | ORAL_TABLET | Freq: Two times a day (BID) | ORAL | Status: DC
  Administered 2022-01-29: 50 mg via ORAL
  Filled 2022-01-28: qty 1

## 2022-01-28 MED ORDER — OXYCODONE HCL 5 MG PO TABS
5.0000 mg | ORAL_TABLET | ORAL | Status: DC | PRN
  Administered 2022-01-28 – 2022-01-29 (×2): 5 mg via ORAL
  Filled 2022-01-28 (×2): qty 1

## 2022-01-28 MED ORDER — OXYCODONE HCL 5 MG PO TABS
10.0000 mg | ORAL_TABLET | ORAL | Status: DC | PRN
  Administered 2022-01-29: 10 mg via ORAL
  Filled 2022-01-28: qty 2

## 2022-01-28 MED ORDER — CLOBAZAM 10 MG PO TABS
15.0000 mg | ORAL_TABLET | Freq: Every day | ORAL | Status: DC
  Administered 2022-01-28: 15 mg via ORAL
  Filled 2022-01-28: qty 2

## 2022-01-28 MED ORDER — POLYETHYLENE GLYCOL 3350 17 G PO PACK: 17.0000 g | PACK | Freq: Every day | ORAL | Status: DC | PRN

## 2022-01-28 MED ORDER — LEVETIRACETAM 500 MG PO TABS
500.0000 mg | ORAL_TABLET | Freq: Two times a day (BID) | ORAL | Status: DC
  Administered 2022-01-29: 500 mg via ORAL
  Filled 2022-01-28: qty 1

## 2022-01-28 MED ORDER — DEXTROSE 5 % IV SOLN
500.0000 mg | Freq: Four times a day (QID) | INTRAVENOUS | Status: DC | PRN
  Filled 2022-01-28: qty 5

## 2022-01-28 MED ORDER — SODIUM CHLORIDE 0.9 % IR SOLN
Status: DC | PRN
  Administered 2022-01-28: 1000 mL

## 2022-01-28 MED ORDER — ASPIRIN EC 325 MG PO TBEC
325.0000 mg | DELAYED_RELEASE_TABLET | Freq: Two times a day (BID) | ORAL | Status: DC
  Administered 2022-01-29: 325 mg via ORAL
  Filled 2022-01-28: qty 1

## 2022-01-28 MED ORDER — CEFAZOLIN SODIUM-DEXTROSE 2-4 GM/100ML-% IV SOLN
2.0000 g | INTRAVENOUS | Status: AC
  Administered 2022-01-28: 2 g via INTRAVENOUS
  Filled 2022-01-28: qty 100

## 2022-01-28 MED ORDER — CHLORHEXIDINE GLUCONATE 0.12 % MT SOLN
15.0000 mL | Freq: Once | OROMUCOSAL | Status: AC
  Administered 2022-01-28: 15 mL via OROMUCOSAL

## 2022-01-28 MED ORDER — ONDANSETRON HCL 4 MG/2ML IJ SOLN
INTRAMUSCULAR | Status: DC | PRN
  Administered 2022-01-28: 4 mg via INTRAVENOUS

## 2022-01-28 MED ORDER — ARIPIPRAZOLE 10 MG PO TABS
30.0000 mg | ORAL_TABLET | Freq: Every day | ORAL | Status: DC
Start: 2022-01-29 — End: 2022-01-29

## 2022-01-28 MED ORDER — FENTANYL CITRATE PF 50 MCG/ML IJ SOSY
50.0000 ug | PREFILLED_SYRINGE | INTRAMUSCULAR | Status: DC
  Administered 2022-01-28: 50 ug via INTRAVENOUS
  Filled 2022-01-28: qty 2

## 2022-01-28 MED ORDER — EPHEDRINE SULFATE-NACL 50-0.9 MG/10ML-% IV SOSY
PREFILLED_SYRINGE | INTRAVENOUS | Status: DC | PRN
  Administered 2022-01-28 (×2): 5 mg via INTRAVENOUS

## 2022-01-28 MED ORDER — SODIUM CHLORIDE (PF) 0.9 % IJ SOLN
INTRAMUSCULAR | Status: AC
  Filled 2022-01-28: qty 50

## 2022-01-28 MED ORDER — ACETAMINOPHEN 10 MG/ML IV SOLN
1000.0000 mg | Freq: Once | INTRAVENOUS | Status: AC
  Administered 2022-01-28: 1000 mg via INTRAVENOUS
  Filled 2022-01-28: qty 100

## 2022-01-28 MED ORDER — LEVETIRACETAM 500 MG PO TABS
1000.0000 mg | ORAL_TABLET | Freq: Every day | ORAL | Status: DC
  Administered 2022-01-28: 1000 mg via ORAL
  Filled 2022-01-28: qty 2

## 2022-01-28 MED ORDER — ROPIVACAINE HCL 5 MG/ML IJ SOLN
INTRAMUSCULAR | Status: DC | PRN
  Administered 2022-01-28: 30 mL via PERINEURAL

## 2022-01-28 MED ORDER — SODIUM CHLORIDE (PF) 0.9 % IJ SOLN
INTRAMUSCULAR | Status: AC
  Filled 2022-01-28: qty 10

## 2022-01-28 MED ORDER — LAMOTRIGINE 100 MG PO TABS
200.0000 mg | ORAL_TABLET | Freq: Two times a day (BID) | ORAL | Status: DC
  Administered 2022-01-29: 200 mg via ORAL
  Filled 2022-01-28: qty 2

## 2022-01-28 MED ORDER — SODIUM CHLORIDE (PF) 0.9 % IJ SOLN
INTRAMUSCULAR | Status: DC | PRN
Start: 2022-01-28 — End: 2022-01-28
  Administered 2022-01-28: 60 mL

## 2022-01-28 SURGICAL SUPPLY — 53 items
ATTUNE MED DOME PAT 38 KNEE (Knees) ×1 IMPLANT
ATTUNE PS FEM LT SZ 7 CEM KNEE (Femur) ×1 IMPLANT
ATTUNE PSRP INSR SZ7 7 KNEE (Insert) ×1 IMPLANT
BAG COUNTER SPONGE SURGICOUNT (BAG) ×1 IMPLANT
BAG ZIPLOCK 12X15 (MISCELLANEOUS) ×2 IMPLANT
BASE TIBIA ATTUNE KNEE SYS SZ6 (Knees) IMPLANT
BLADE SAG 18X100X1.27 (BLADE) ×2 IMPLANT
BLADE SAW SGTL 11.0X1.19X90.0M (BLADE) ×2 IMPLANT
BNDG ELASTIC 6X5.8 VLCR STR LF (GAUZE/BANDAGES/DRESSINGS) ×2 IMPLANT
BOWL SMART MIX CTS (DISPOSABLE) ×2 IMPLANT
CEMENT HV SMART SET (Cement) ×4 IMPLANT
CLSR STERI-STRIP ANTIMIC 1/2X4 (GAUZE/BANDAGES/DRESSINGS) ×2 IMPLANT
COVER SURGICAL LIGHT HANDLE (MISCELLANEOUS) ×2 IMPLANT
CUFF TOURN SGL QUICK 34 (TOURNIQUET CUFF) ×2
CUFF TRNQT CYL 34X4.125X (TOURNIQUET CUFF) ×1 IMPLANT
DRAPE IMP U-DRAPE 54X76 (DRAPES) ×1 IMPLANT
DRAPE INCISE IOBAN 66X45 STRL (DRAPES) ×2 IMPLANT
DRAPE U-SHAPE 47X51 STRL (DRAPES) ×2 IMPLANT
DRESSING AQUACEL AG SP 3.5X10 (GAUZE/BANDAGES/DRESSINGS) IMPLANT
DRSG AQUACEL AG ADV 3.5X10 (GAUZE/BANDAGES/DRESSINGS) ×2 IMPLANT
DRSG AQUACEL AG SP 3.5X10 (GAUZE/BANDAGES/DRESSINGS) ×2
DURAPREP 26ML APPLICATOR (WOUND CARE) ×2 IMPLANT
ELECT REM PT RETURN 15FT ADLT (MISCELLANEOUS) ×2 IMPLANT
GLOVE SRG 8 PF TXTR STRL LF DI (GLOVE) ×1 IMPLANT
GLOVE SURG ENC MOIS LTX SZ6.5 (GLOVE) ×2 IMPLANT
GLOVE SURG ENC MOIS LTX SZ8 (GLOVE) ×4 IMPLANT
GLOVE SURG UNDER POLY LF SZ7 (GLOVE) ×2 IMPLANT
GLOVE SURG UNDER POLY LF SZ8 (GLOVE) ×2
GOWN STRL REUS W/ TWL LRG LVL3 (GOWN DISPOSABLE) ×1 IMPLANT
GOWN STRL REUS W/TWL LRG LVL3 (GOWN DISPOSABLE) ×2
HANDPIECE INTERPULSE COAX TIP (DISPOSABLE) ×2
HOLDER FOLEY CATH W/STRAP (MISCELLANEOUS) ×1 IMPLANT
IMMOBILIZER KNEE 20 (SOFTGOODS) ×2
IMMOBILIZER KNEE 20 THIGH 36 (SOFTGOODS) ×1 IMPLANT
KIT TURNOVER KIT A (KITS) IMPLANT
MANIFOLD NEPTUNE II (INSTRUMENTS) ×2 IMPLANT
NS IRRIG 1000ML POUR BTL (IV SOLUTION) ×2 IMPLANT
PACK TOTAL KNEE CUSTOM (KITS) ×2 IMPLANT
PADDING CAST COTTON 6X4 STRL (CAST SUPPLIES) ×4 IMPLANT
PROTECTOR NERVE ULNAR (MISCELLANEOUS) ×2 IMPLANT
SET HNDPC FAN SPRY TIP SCT (DISPOSABLE) ×1 IMPLANT
SPIKE FLUID TRANSFER (MISCELLANEOUS) ×2 IMPLANT
STRIP CLOSURE SKIN 1/2X4 (GAUZE/BANDAGES/DRESSINGS) ×4 IMPLANT
SUT MNCRL AB 4-0 PS2 18 (SUTURE) ×2 IMPLANT
SUT STRATAFIX 0 PDS 27 VIOLET (SUTURE) ×2
SUT VIC AB 2-0 CT1 27 (SUTURE) ×6
SUT VIC AB 2-0 CT1 TAPERPNT 27 (SUTURE) ×3 IMPLANT
SUTURE STRATFX 0 PDS 27 VIOLET (SUTURE) ×1 IMPLANT
TIBIA ATTUNE KNEE SYS BASE SZ6 (Knees) ×2 IMPLANT
TRAY FOLEY MTR SLVR 16FR STAT (SET/KITS/TRAYS/PACK) ×2 IMPLANT
TUBE SUCTION HIGH CAP CLEAR NV (SUCTIONS) ×2 IMPLANT
WATER STERILE IRR 1000ML POUR (IV SOLUTION) ×4 IMPLANT
WRAP KNEE MAXI GEL POST OP (GAUZE/BANDAGES/DRESSINGS) ×2 IMPLANT

## 2022-01-28 NOTE — Transfer of Care (Signed)
Immediate Anesthesia Transfer of Care Note ? ?Patient: Andrea Crawford ? ?Procedure(s) Performed: TOTAL KNEE ARTHROPLASTY (Left: Knee) ? ?Patient Location: PACU ? ?Anesthesia Type:Spinal ? ?Level of Consciousness: awake ? ?Airway & Oxygen Therapy: Patient Spontanous Breathing and Patient connected to face mask oxygen ? ?Post-op Assessment: Report given to RN and Post -op Vital signs reviewed and stable ? ?Post vital signs: Reviewed and stable ? ?Last Vitals:  ?Vitals Value Taken Time  ?BP    ?Temp    ?Pulse    ?Resp    ?SpO2    ? ? ?Last Pain:  ?Vitals:  ? 01/28/22 1020  ?TempSrc: Oral  ?   ? ?  ? ?Complications: No notable events documented. ?

## 2022-01-28 NOTE — Progress Notes (Signed)
Assisted Dr. Andres Shad with Left Knee Adductor Canal block. Side rails up, monitors on throughout procedure. See vital signs in flow sheet. Tolerated Procedure well. ? ?

## 2022-01-28 NOTE — Discharge Instructions (Signed)
? ?Gaynelle Arabian, MD ?Total Joint Specialist ?[facility] ?[address] ?[place], [place] [address] ?[phone] ? ?TOTAL KNEE REPLACEMENT POSTOPERATIVE DIRECTIONS ? ? ? ?Knee Rehabilitation, Guidelines Following Surgery  ?Results after knee surgery are often greatly improved when you follow the exercise, range of motion and muscle strengthening exercises prescribed by your doctor. Safety measures are also important to protect the knee from further injury. If any of these exercises cause you to have increased pain or swelling in your knee joint, decrease the amount until you are comfortable again and slowly increase them. If you have problems or questions, call your caregiver or physical therapist for advice.  ? ?BLOOD CLOT PREVENTION ?Take a 325 mg Aspirin two times a day for three weeks following surgery. Then take an 81 mg Aspirin once a day for three weeks. Then discontinue Aspirin. ?You may resume your vitamins/supplements upon discharge from the hospital. ?Do not take any NSAIDs (Advil, Aleve, Ibuprofen, Meloxicam, etc.) until you have discontinued the 325 mg Aspirin.  ? ?HOME CARE INSTRUCTIONS  ?Remove items at home which could result in a fall. This includes throw rugs or furniture in walking pathways.  ?ICE to the affected knee as much as tolerated. Icing helps control swelling. If the swelling is well controlled you will be more comfortable and rehab easier. Continue to use ice on the knee for pain and swelling from surgery. You may notice swelling that will progress down to the foot and ankle. This is normal after surgery. Elevate the leg when you are not up walking on it.    ?Continue to use the breathing machine which will help keep your temperature down. It is common for your temperature to cycle up and down following surgery, especially at night when you are not up moving around and exerting yourself. The breathing machine keeps your lungs expanded and your temperature  down. ?Do not place pillow under the operative knee, focus on keeping the knee straight while resting ? ?DIET ?You may resume your previous home diet once you are discharged from the hospital. ? ?DRESSING / WOUND CARE / SHOWERING ?Keep your bulky bandage on for 2 days. On the third post-operative day you may remove the Ace bandage and gauze. There is a waterproof adhesive bandage on your skin which will stay in place until your first follow-up appointment. Once you remove this you will not need to place another bandage ?You may begin showering 3 days following surgery, but do not submerge the incision under water. ? ?ACTIVITY ?For the first 5 days, the key is rest and control of pain and swelling ?Do your home exercises twice a day starting on post-operative day 3. On the days you go to physical therapy, just do the home exercises once that day. ?You should rest, ice and elevate the leg for 50 minutes out of every hour. Get up and walk/stretch for 10 minutes per hour. After 5 days you can increase your activity slowly as tolerated. ?Walk with your walker as instructed. Use the walker until you are comfortable transitioning to a cane. Walk with the cane in the opposite hand of the operative leg. You may discontinue the cane once you are comfortable and walking steadily. ?Avoid periods of inactivity such as sitting longer than an hour when not asleep. This helps prevent blood clots.  ?You may discontinue the knee immobilizer once you are able to perform a straight leg raise while lying down. ?You may resume a sexual relationship in one  month or when given the OK by your doctor.  ?You may return to work once you are cleared by your doctor.  ?Do not drive a car for 6 weeks or until released by your surgeon.  ?Do not drive while taking narcotics. ? ?TED HOSE STOCKINGS ?Wear the elastic stockings on both legs for three weeks following surgery during the day. You may remove them at night for sleeping. ? ?WEIGHT  BEARING ?Weight bearing as tolerated with assist device (walker, cane, etc) as directed, use it as long as suggested by your surgeon or therapist, typically at least 4-6 weeks. ? ?POSTOPERATIVE CONSTIPATION PROTOCOL ?Constipation - defined medically as fewer than three stools per week and severe constipation as less than one stool per week. ? ?One of the most common issues patients have following surgery is constipation.  Even if you have a regular bowel pattern at home, your normal regimen is likely to be disrupted due to multiple reasons following surgery.  Combination of anesthesia, postoperative narcotics, change in appetite and fluid intake all can affect your bowels.  In order to avoid complications following surgery, here are some recommendations in order to help you during your recovery period. ? ?Colace (docusate) - Pick up an over-the-counter form of Colace or another stool softener and take twice a day as long as you are requiring postoperative pain medications.  Take with a full glass of water daily.  If you experience loose stools or diarrhea, hold the colace until you stool forms back up. If your symptoms do not get better within 1 week or if they get worse, check with your doctor. ?Dulcolax (bisacodyl) - Pick up over-the-counter and take as directed by the product packaging as needed to assist with the movement of your bowels.  Take with a full glass of water.  Use this product as needed if not relieved by Colace only.  ?MiraLax (polyethylene glycol) - Pick up over-the-counter to have on hand. MiraLax is a solution that will increase the amount of water in your bowels to assist with bowel movements.  Take as directed and can mix with a glass of water, juice, soda, coffee, or tea. Take if you go more than two days without a movement. Do not use MiraLax more than once per day. Call your doctor if you are still constipated or irregular after using this medication for 7 days in a row. ? ?If you continue  to have problems with postoperative constipation, please contact the office for further assistance and recommendations.  If you experience "the worst abdominal pain ever" or develop nausea or vomiting, please contact the office immediatly for further recommendations for treatment. ? ?ITCHING ?If you experience itching with your medications, try taking only a single pain pill, or even half a pain pill at a time.  You can also use Benadryl over the counter for itching or also to help with sleep.  ? ?MEDICATIONS ?See your medication summary on the ?After Visit Summary? that the nursing staff will review with you prior to discharge.  You may have some home medications which will be placed on hold until you complete the course of blood thinner medication.  It is important for you to complete the blood thinner medication as prescribed by your surgeon.  Continue your approved medications as instructed at time of discharge. ? ?PRECAUTIONS ?If you experience chest pain or shortness of breath - call 911 immediately for transfer to the hospital emergency department.  ?If you develop a fever greater that 101  F, purulent drainage from wound, increased redness or drainage from wound, foul odor from the wound/dressing, or calf pain - CONTACT YOUR SURGEON.   ?                                                ?FOLLOW-UP APPOINTMENTS ?Make sure you keep all of your appointments after your operation with your surgeon and caregivers. You should call the office at the above phone number and make an appointment for approximately two weeks after the date of your surgery or on the date instructed by your surgeon outlined in the "After Visit Summary". ? ?RANGE OF MOTION AND STRENGTHENING EXERCISES  ?Rehabilitation of the knee is important following a knee injury or an operation. After just a few days of immobilization, the muscles of the thigh which control the knee become weakened and shrink (atrophy). Knee exercises are designed to build up  the tone and strength of the thigh muscles and to improve knee motion. Often times heat used for twenty to thirty minutes before working out will loosen up your tissues and help with improving the range of motion

## 2022-01-28 NOTE — Interval H&P Note (Signed)
History and Physical Interval Note: ? ?01/28/2022 ?10:22 AM ? ?Andrea Crawford  has presented today for surgery, with the diagnosis of left knee osteoarthritis.  The various methods of treatment have been discussed with the patient and family. After consideration of risks, benefits and other options for treatment, the patient has consented to  Procedure(s): ?TOTAL KNEE ARTHROPLASTY (Left) as a surgical intervention.  The patient's history has been reviewed, patient examined, no change in status, stable for surgery.  I have reviewed the patient's chart and labs.  Questions were answered to the patient's satisfaction.   ? ? ?[name] [name] ? ? ?

## 2022-01-28 NOTE — Care Plan (Signed)
Ortho Bundle Case Management Note ? ?Patient Details  ?Name: Andrea Crawford ?MRN: [id number] ?Date of Birth: April 02, 1956 ? ?                ?L TKA on 01/28/22. DCP: Home with husband. DME: No needs. Has RW. PT: EO 4/6 ? ? ?DME Arranged:  N/A ?DME Agency:    ? ?HH Arranged:    ?[place] Agency:    ? ?Additional Comments: ?Please contact me with any questions of if this plan should need to change. ? ?Larwance Rote  [phone] ?01/28/2022, 3:27 PM ?  ?

## 2022-01-28 NOTE — Op Note (Signed)
OPERATIVE REPORT-TOTAL KNEE ARTHROPLASTY ? ? ?Pre-operative diagnosis- Osteoarthritis  Left knee(s) ? ?Post-operative diagnosis- Osteoarthritis Left knee(s) ? ?Procedure-  Left  Total Knee Arthroplasty ? ?Surgeon- Andrea Plover. Sharese Manrique, MD ? ?Assistant- Theresa Duty, PA-C  ? ?Anesthesia-   Adductor canal block and spinal ? ?EBL 25 ml ?  ?Drains None ? ?Tourniquet time-  ?Total Tourniquet Time Documented: ?Thigh (Left) - 39 minutes ?Total: Thigh (Left) - 39 minutes ?   ? ?Complications- None ? ?Condition-PACU - hemodynamically stable.  ? ?Brief Clinical Note  Andrea Crawford is a 66 y.o. year old female with end stage OA of her left knee with progressively worsening pain and dysfunction. She has constant pain, with activity and at rest and significant functional deficits with difficulties even with ADLs. She has had extensive non-op management including analgesics, injections of cortisone, and home exercise program, but remains in significant pain with significant dysfunction. Radiographs show bone on bone arthritis lateral and patellofemoral. She presents now for left Total Knee Arthroplasty.    ? ?Procedure in detail--- ? ? The patient is brought into the operating room and positioned supine on the operating table. After successful administration of  Adductor canal block and spinal,   a tourniquet is placed high on the  Left thigh(s) and the lower extremity is prepped and draped in the usual sterile fashion. Time out is performed by the operating team and then the  Left lower extremity is wrapped in Esmarch, knee flexed and the tourniquet inflated to 300 mmHg.  ?     A midline incision is made with a ten blade through the subcutaneous tissue to the level of the extensor mechanism. A fresh blade is used to make a medial parapatellar arthrotomy. Soft tissue over the proximal medial tibia is subperiosteally elevated to the joint line with a knife and into the semimembranosus bursa with a Cobb elevator. Soft tissue  over the proximal lateral tibia is elevated with attention being paid to avoiding the patellar tendon on the tibial tubercle. The patella is everted, knee flexed 90 degrees and the ACL and PCL are removed. Findings are bone on bone lateral and patellofemoral with massive global osteophytes   ?     The drill is used to create a starting hole in the distal femur and the canal is thoroughly irrigated with sterile saline to remove the fatty contents. The 5 degree Left  valgus alignment guide is placed into the femoral canal and the distal femoral cutting block is pinned to remove 9 mm off the distal femur. Resection is made with an oscillating saw. ?     The tibia is subluxed forward and the menisci are removed. The extramedullary alignment guide is placed referencing proximally at the medial aspect of the tibial tubercle and distally along the second metatarsal axis and tibial crest. The block is pinned to remove 30m off the more deficient lateral  side. Resection is made with an oscillating saw. Size 6is the most appropriate size for the tibia and the proximal tibia is prepared with the modular drill and keel punch for that size. ?     The femoral sizing guide is placed and size 7 is most appropriate. Rotation is marked off the epicondylar axis and confirmed by creating a rectangular flexion gap at 90 degrees. The size 7 cutting block is pinned in this rotation and the anterior, posterior and chamfer cuts are made with the oscillating saw. The intercondylar block is then placed and that cut is made. ?  Trial size 6 tibial component, trial size 7 posterior stabilized femur and a 7  mm posterior stabilized rotating platform insert trial is placed. Full extension is achieved with excellent varus/valgus and anterior/posterior balance throughout full range of motion. The patella is everted and thickness measured to be 25  mm. Free hand resection is taken to 15 mm, a 38 template is placed, lug holes are drilled, trial  patella is placed, and it tracks normally. Osteophytes are removed off the posterior femur with the trial in place. All trials are removed and the cut bone surfaces prepared with pulsatile lavage. Cement is mixed and once ready for implantation, the size 6 tibial implant, size  7 posterior stabilized femoral component, and the size 38 patella are cemented in place and the patella is held with the clamp. The trial insert is placed and the knee held in full extension. The Exparel (20 ml mixed with 60 ml saline) is injected into the extensor mechanism, posterior capsule, medial and lateral gutters and subcutaneous tissues.  All extruded cement is removed and once the cement is hard the permanent 7 mm posterior stabilized rotating platform insert is placed into the tibial tray. ?     The wound is copiously irrigated with saline solution and the extensor mechanism closed with # 0 Stratofix suture. The tourniquet is released for a total tourniquet time of 39  minutes. Flexion against gravity is 140 degrees and the patella tracks normally. Subcutaneous tissue is closed with 2.0 vicryl and subcuticular with running 4.0 Monocryl. The incision is cleaned and dried and steri-strips and a bulky sterile dressing are applied. The limb is placed into a knee immobilizer and the patient is awakened and transported to recovery in stable condition. ?     Please note that a surgical assistant was a medical necessity for this procedure in order to perform it in a safe and expeditious manner. Surgical assistant was necessary to retract the ligaments and vital neurovascular structures to prevent injury to them and also necessary for proper positioning of the limb to allow for anatomic placement of the prosthesis. ? ? ?Andrea Plover Daijha Leggio, MD ? ? ? ?01/28/2022, 1:41 PM ? ? ?

## 2022-01-28 NOTE — Anesthesia Procedure Notes (Addendum)
Anesthesia Regional Block: Adductor canal block  ? ?Pre-Anesthetic Checklist: , timeout performed,  Correct Patient, Correct Site, Correct Laterality,  Correct Procedure, Correct Position, site marked,  Risks and benefits discussed,  Surgical consent,  Pre-op evaluation,  At surgeon's request and post-op pain management ? ?Laterality: Lower and Left ? ?Prep: chloraprep     ?  ?Needles:  ?Injection technique: Single-shot ? ?Needle Type: Echogenic Needle   ? ? ?Needle Length: 9cm  ?Needle Gauge: 22  ? ? ? ?Additional Needles: ? ? ?Procedures:,,,, ultrasound used (permanent image in chart),,    ?Narrative:  ?Start time: 01/28/2022 10:59 AM ?End time: 01/28/2022 11:04 AM ?Injection made incrementally with aspirations every 5 mL. ? ?Performed by: Personally  ?Anesthesiologist: Barnet Glasgow, MD ? ?Additional Notes: ?Block assessed prior to surgery. Pt tolerated procedure well. ? ? ? ? ?

## 2022-01-28 NOTE — Progress Notes (Signed)
Orthopedic Tech Progress Note ?Patient Details:  ?VANYA CARBERRY ?September 19, 1956 ?[id number] ? ?CPM Left Knee ?CPM Left Knee: On ?Left Knee Flexion (Degrees): 10 ?Left Knee Extension (Degrees): 40 ? ?Post Interventions ?Patient Tolerated: Well ? ?Charline Bills Kristyn Obyrne ?01/28/2022, 3:58 PM ?Applied cpm. ?

## 2022-01-28 NOTE — Evaluation (Signed)
Physical Therapy Evaluation ?Patient Details ?Name: Andrea Crawford ?MRN: [id number] ?DOB: 07/09/1956 ?Today's Date: 01/28/2022 ? ?History of Present Illness ? Pt is a 66yo female presenting s/p L-TKA on 01/28/22. PMH: drug-induced parkinson's disease, anemia, OA, seizures, anemia.  ?Clinical Impression ? Andrea Crawford is a 66 y.o. female POD 0 s/p L-TKA. Patient reports modified independence with mobility at baseline. Patient is now limited by functional impairments (see PT problem list below) and requires min guard for transfers. Patient instructed in exercise to facilitate ROM and circulation to manage edema. Patient will benefit from continued skilled PT interventions to address impairments and progress towards PLOF. Acute PT will follow to progress mobility and stair training in preparation for safe discharge home. ?   ?   ? ?Recommendations for follow up therapy are one component of a multi-disciplinary discharge planning process, led by the attending physician.  Recommendations may be updated based on patient status, additional functional criteria and insurance authorization. ? ?Follow Up Recommendations Follow physician's recommendations for discharge plan and follow up therapies ? ?  ?Assistance Recommended at Discharge Set up Supervision/Assistance  ?Patient can return home with the following ? A little help with walking and/or transfers;A little help with bathing/dressing/bathroom;Assistance with cooking/housework;Help with stairs or ramp for entrance;Assist for transportation ? ?  ?Equipment Recommendations None recommended by PT (Pt has recommended DME)  ?Recommendations for Other Services ?    ?  ?Functional Status Assessment Patient has had a recent decline in their functional status and demonstrates the ability to make significant improvements in function in a reasonable and predictable amount of time.  ? ?  ?Precautions / Restrictions Precautions ?Precautions: Fall ?Restrictions ?Weight Bearing  Restrictions: No ?LLE Weight Bearing: Weight bearing as tolerated  ? ?  ? ?Mobility ? Bed Mobility ?Overal bed mobility: Needs Assistance ?Bed Mobility: Supine to Sit ?  ?  ?Supine to sit: Min guard ?  ?  ?General bed mobility comments: Pt min guard for safety and line management only, no physical assist required. ?  ? ?Transfers ?Overall transfer level: Needs assistance ?Equipment used: Rolling walker (2 wheels) ?Transfers: Sit to/from Stand, Bed to chair/wheelchair/BSC ?Sit to Stand: Min guard ?  ?Step pivot transfers: Min guard ?  ?  ?  ?General transfer comment: Pt min guard for safety only, no physical assist required. Upon standing, pt demonstrated knee flexion in stance and unable to extend without PT assist but L knee does not buckle. Step pivot transfer was completed with min guard assist as well, VC for sequencing. ?  ? ?Ambulation/Gait ?  ?  ?  ?  ?  ?  ?  ?General Gait Details: deferred ? ?Stairs ?  ?  ?  ?  ?  ? ?Wheelchair Mobility ?  ? ?Modified Rankin (Stroke Patients Only) ?  ? ?  ? ?Balance Overall balance assessment: Needs assistance ?Sitting-balance support: Feet supported, No upper extremity supported ?Sitting balance-Leahy Scale: Fair ?  ?  ?Standing balance support: Reliant on assistive device for balance, No upper extremity supported, Bilateral upper extremity supported ?Standing balance-Leahy Scale: Poor ?  ?  ?  ?  ?  ?  ?  ?  ?  ?  ?  ?  ?   ? ? ? ?Pertinent Vitals/Pain Pain Assessment ?Pain Assessment: No/denies pain  ? ? ?Home Living Family/patient expects to be discharged to:: Private residence ?Living Arrangements: Spouse/significant other ?Available Help at Discharge: Family;Available 24 hours/day ?Type of Home: House ?Home Access: Stairs to enter ?  Entrance Stairs-Rails: None ?Entrance Stairs-Number of Steps: 1 ?Alternate Level Stairs-Number of Steps: 11 ?Home Layout: Two level ?Home Equipment: Conservation officer, nature (2 wheels);Rollator (4 wheels);Cane - single point;Tub bench ?   ?  ?Prior  Function Prior Level of Function : History of Falls (last six months);Independent/Modified Independent ?  ?  ?  ?  ?  ?  ?  ?  ?  ? ? ?Hand Dominance  ?   ? ?  ?Extremity/Trunk Assessment  ? Upper Extremity Assessment ?Upper Extremity Assessment: Overall WFL for tasks assessed ?  ? ?Lower Extremity Assessment ?Lower Extremity Assessment: RLE deficits/detail;LLE deficits/detail ?RLE Deficits / Details: MMT ank df/pf 5/5 ?RLE Sensation: WNL ?LLE Deficits / Details: MMT ank df/pf 4+/5, no extensor lag noted ?LLE Sensation: WNL ?  ? ?Cervical / Trunk Assessment ?Cervical / Trunk Assessment: Normal  ?Communication  ? Communication: No difficulties (slight lisp)  ?Cognition Arousal/Alertness: Awake/alert ?Behavior During Therapy: [facility] for tasks assessed/performed ?Overall Cognitive Status: Within Functional Limits for tasks assessed ?  ?  ?  ?  ?  ?  ?  ?  ?  ?  ?  ?  ?  ?  ?  ?  ?  ?  ?  ? ?  ?General Comments   ? ?  ?Exercises Total Joint Exercises ?Ankle Circles/Pumps: AROM, 20 reps, Both ?Quad Sets: AROM, Left, 5 reps  ? ?Assessment/Plan  ?  ?PT Assessment Patient needs continued PT services  ?PT Problem List Decreased strength;Decreased range of motion;Decreased activity tolerance;Decreased balance;Decreased mobility;Decreased coordination;Decreased knowledge of use of DME;Decreased safety awareness;Decreased knowledge of precautions;Pain ? ?   ?  ?PT Treatment Interventions DME instruction;Gait training;Stair training;Functional mobility training;Therapeutic activities;Therapeutic exercise;Balance training;Neuromuscular re-education;Patient/family education   ? ?PT Goals (Current goals can be found in the Care Plan section)  ?Acute Rehab PT Goals ?Patient Stated Goal: To return to gardening and go up stairs ?PT Goal Formulation: With patient/family ?Time For Goal Achievement: 02/04/22 ?Potential to Achieve Goals: Good ? ?  ?Frequency 7X/week ?  ? ? ?Co-evaluation   ?  ?  ?  ?  ? ? ?  ?AM-PAC PT "6 Clicks" Mobility   ?Outcome Measure Help needed turning from your back to your side while in a flat bed without using bedrails?: None ?Help needed moving from lying on your back to sitting on the side of a flat bed without using bedrails?: None ?Help needed moving to and from a bed to a chair (including a wheelchair)?: A Little ?Help needed standing up from a chair using your arms (e.g., wheelchair or bedside chair)?: A Little ?Help needed to walk in hospital room?: A Little ?Help needed climbing 3-5 steps with a railing? : A Little ?6 Click Score: 20 ? ?  ?End of Session Equipment Utilized During Treatment: Gait belt ?Activity Tolerance: Patient tolerated treatment well ?Patient left: with call bell/phone within reach;in chair;with chair alarm set;with family/visitor present;with nursing/sitter in room ?Nurse Communication: Mobility status ?PT Visit Diagnosis: Difficulty in walking, not elsewhere classified (R26.2);Pain ?Pain - Right/Left: Left ?Pain - part of body: Knee ?  ? ?Time: 1827-1900 ?PT Time Calculation (min) (ACUTE ONLY): 33 min ? ? ?Charges:   PT Evaluation ?$PT Eval Low Complexity: 1 Low ?PT Treatments ?$Therapeutic Activity: 8-22 mins ?  ?   ? ? ?Coolidge Breeze, PT, DPT ?[facility] Department ?Office: [phone] ?Pager: [phone] ? ?Coolidge Breeze ?01/28/2022, 7:28 PM ? ?

## 2022-01-28 NOTE — Anesthesia Postprocedure Evaluation (Signed)
Anesthesia Post Note ? ?Patient: Andrea Crawford ? ?Procedure(s) Performed: TOTAL KNEE ARTHROPLASTY (Left: Knee) ? ?  ? ?Patient location during evaluation: Nursing Unit ?Anesthesia Type: Regional ?Level of consciousness: oriented and awake and alert ?Pain management: pain level controlled ?Vital Signs Assessment: post-procedure vital signs reviewed and stable ?Respiratory status: spontaneous breathing and respiratory function stable ?Cardiovascular status: blood pressure returned to baseline and stable ?Postop Assessment: no headache, no backache, no apparent nausea or vomiting and patient able to bend at knees ?Anesthetic complications: no ? ? ?No notable events documented. ? ?Last Vitals:  ?Vitals:  ? 01/28/22 1135 01/28/22 1140  ?BP: (!) 103/59 110/61  ?Pulse: (!) 56 (!) 48  ?Resp: 15 12  ?Temp:    ?SpO2: 100% 100%  ?  ?Last Pain:  ?Vitals:  ? 01/28/22 1020  ?TempSrc: Oral  ? ? ?  ?  ?  ?  ?  ?  ? ?Barnet [place] ? ? ? ? ?

## 2022-01-28 NOTE — Anesthesia Procedure Notes (Signed)
Procedure Name: [place] ?Date/Time: 01/28/2022 12:15 PM ?Performed by: Maxwell Caul, CRNA ?Pre-anesthesia Checklist: Patient identified, Emergency Drugs available, Suction available and Patient being monitored ?Oxygen Delivery Method: Simple face mask ? ? ? ? ?

## 2022-01-29 ENCOUNTER — Encounter (HOSPITAL_COMMUNITY): Payer: Self-pay | Admitting: Orthopedic Surgery

## 2022-01-29 DIAGNOSIS — D61818 Other pancytopenia: Secondary | ICD-10-CM | POA: Diagnosis not present

## 2022-01-29 DIAGNOSIS — G4733 Obstructive sleep apnea (adult) (pediatric): Secondary | ICD-10-CM | POA: Diagnosis not present

## 2022-01-29 DIAGNOSIS — I509 Heart failure, unspecified: Secondary | ICD-10-CM | POA: Diagnosis not present

## 2022-01-29 DIAGNOSIS — M1712 Unilateral primary osteoarthritis, left knee: Secondary | ICD-10-CM | POA: Diagnosis not present

## 2022-01-29 LAB — BASIC METABOLIC PANEL
Anion gap: 5 (ref 5–15)
BUN: 12 mg/dL (ref 8–23)
CO2: 30 mmol/L (ref 22–32)
Calcium: 8.6 mg/dL — ABNORMAL LOW (ref 8.9–10.3)
Chloride: 103 mmol/L (ref 98–111)
Creatinine, Ser: 0.68 mg/dL (ref 0.44–1.00)
GFR, Estimated: >60 mL/min (ref >60)
Glucose, Bld: 128 mg/dL — ABNORMAL HIGH (ref 70–99)
Potassium: 3.8 mmol/L (ref 3.5–5.1)
Sodium: 138 mmol/L (ref 135–145)

## 2022-01-29 LAB — CBC
HCT: 34.1 % — ABNORMAL LOW (ref 36.0–46.0)
Hemoglobin: 11.1 g/dL — ABNORMAL LOW (ref 12.0–15.0)
MCH: 30.7 pg (ref 26.0–34.0)
MCHC: 32.6 g/dL (ref 30.0–36.0)
MCV: 94.5 fL (ref 80.0–100.0)
Platelets: 145 10*3/uL — ABNORMAL LOW (ref 150–400)
RBC: 3.61 MIL/uL — ABNORMAL LOW (ref 3.87–5.11)
RDW: 13.1 % (ref 11.5–15.5)
WBC: 7.5 10*3/uL (ref 4.0–10.5)
nRBC: 0 % (ref 0.0–0.2)

## 2022-01-29 MED ORDER — METHOCARBAMOL 500 MG PO TABS: 500.0000 mg | ORAL_TABLET | Freq: Four times a day (QID) | ORAL | 0 refills | Status: DC | PRN

## 2022-01-29 MED ORDER — ASPIRIN 325 MG PO TBEC: 325.0000 mg | DELAYED_RELEASE_TABLET | Freq: Two times a day (BID) | ORAL | 0 refills | Status: DC

## 2022-01-29 MED ORDER — OXYCODONE HCL 5 MG PO TABS: 5.0000 mg | ORAL_TABLET | Freq: Four times a day (QID) | ORAL | 0 refills | Status: DC | PRN

## 2022-01-29 NOTE — Progress Notes (Signed)
Physical Therapy Treatment ?Patient Details ?Name: Andrea Crawford ?MRN: [id number] ?DOB: June 25, 1956 ?Today's Date: 01/29/2022 ? ? ?History of Present Illness Pt is a 66yo female presenting s/p L-TKA on 01/28/22. PMH: drug-induced parkinson's disease, anemia, OA, seizures, anemia. ? ?  ?PT Comments  ? ? Pt is POD1 and in recliner upon entry and agreeable to therapy. Pt min guard for all mobility tasks today, and during ambulation progressed to supervision level, no physical assist required. Pt and husband educated on safe stair ascent and descent using a variety of methods and demonstrated safe and appropriate technique during practice. Provided HEP handout and went through list of exercises for ROM and circulation, pt demonstrated good form, answered pt's questions, and encouraged pt to not overdo it. Pt is progressing well and feel they are safe to discharge to the destination indicated below with family assistance. We will continue to follow acutely to promote modified independence with functional mobility. ?   ?Recommendations for follow up therapy are one component of a multi-disciplinary discharge planning process, led by the attending physician.  Recommendations may be updated based on patient status, additional functional criteria and insurance authorization. ? ?Follow Up Recommendations ? Follow physician's recommendations for discharge plan and follow up therapies ?  ?  ?Assistance Recommended at Discharge Set up Supervision/Assistance  ?Patient can return home with the following A little help with walking and/or transfers;A little help with bathing/dressing/bathroom;Assistance with cooking/housework;Help with stairs or ramp for entrance;Assist for transportation ?  ?Equipment Recommendations ? None recommended by PT (Pt has recommended DME)  ?  ?Recommendations for Other Services   ? ? ?  ?Precautions / Restrictions Precautions ?Precautions: Fall ?Restrictions ?Weight Bearing Restrictions: No ?LLE Weight  Bearing: Weight bearing as tolerated  ?  ? ?Mobility ? Bed Mobility ?  ?  ?  ?  ?  ?  ?  ?General bed mobility comments: Pt seated in recliner at start and end of session ?  ? ?Transfers ?Overall transfer level: Needs assistance ?Equipment used: Rolling walker (2 wheels) ?Transfers: Sit to/from Stand ?Sit to Stand: Min guard, Supervision ?  ?  ?  ?  ?  ?General transfer comment: Pt min guard for safety only, no physical assist required. ?  ? ?Ambulation/Gait ?Ambulation/Gait assistance: Supervision ?Gait Distance (Feet): 90 Feet ?Assistive device: Rolling walker (2 wheels) ?Gait Pattern/deviations: Step-to pattern, Step-through pattern, Narrow base of support, Trunk flexed, Knee flexed in stance - left ?  ?  ?  ?General Gait Details: Pt ambulated with min guard assist and RW with no overt LOB noted. Demonstrated decreased step length and narrow BOS, started with step-to pattern that progressed to step through by end of ambulation task. VCs for proximity to device and upright posture. Encouraged pt to utilize RW for a minimum of two weeks. ? ? ?Stairs ?Stairs: Yes ?Stairs assistance: Supervision, Min guard ?Stair Management: One rail Right, With cane, Forwards, Step to pattern ?Number of Stairs: 6 ?General stair comments: Pt and husband educated on safe stair ascent/descent using both sideways with 1 handrail method and cane + handrail; verbalized understanding and questions were answered to their satisfaction. Pt and husband completed three bouts of training on two stairs with no overt LOB, VCs for sequencing and safe gaurding position. Encouraged pt to wait to ascend to second story of house until later on in rehab progression secondary to pain, pt verbalized acquiescence. ? ? ?Wheelchair Mobility ?  ? ?Modified Rankin (Stroke Patients Only) ?  ? ? ?  ?Balance  Overall balance assessment: Needs assistance ?Sitting-balance support: Feet supported, No upper extremity supported ?Sitting balance-Leahy Scale: Fair ?  ?   ?Standing balance support: Reliant on assistive device for balance, No upper extremity supported, Bilateral upper extremity supported ?Standing balance-Leahy Scale: Poor ?  ?  ?  ?  ?  ?  ?  ?  ?  ?  ?  ?  ?  ? ?  ?Cognition Arousal/Alertness: Awake/alert ?Behavior During Therapy: [facility] for tasks assessed/performed ?Overall Cognitive Status: Within Functional Limits for tasks assessed ?  ?  ?  ?  ?  ?  ?  ?  ?  ?  ?  ?  ?  ?  ?  ?  ?  ?  ?  ? ?  ?Exercises Total Joint Exercises ?Ankle Circles/Pumps: AROM, 20 reps, Both ?Quad Sets: AROM, Left, 5 reps ?Heel Slides: AROM, Left, 10 reps ? ?  ?General Comments   ?  ?  ? ?Pertinent Vitals/Pain    ? ? ?Home Living   ?  ?  ?  ?  ?  ?  ?  ?  ?  ?   ?  ?Prior Function    ?  ?  ?   ? ?PT Goals (current goals can now be found in the care plan section) Acute Rehab PT Goals ?Patient Stated Goal: To return to gardening and go up stairs ?PT Goal Formulation: With patient/family ?Time For Goal Achievement: 02/04/22 ?Potential to Achieve Goals: Good ?Progress towards PT goals: Progressing toward goals ? ?  ?Frequency ? ? ? 7X/week ? ? ? ?  ?PT Plan Current plan remains appropriate  ? ? ?Co-evaluation   ?  ?  ?  ?  ? ?  ?AM-PAC PT "6 Clicks" Mobility   ?Outcome Measure ? Help needed turning from your back to your side while in a flat bed without using bedrails?: None ?Help needed moving from lying on your back to sitting on the side of a flat bed without using bedrails?: None ?Help needed moving to and from a bed to a chair (including a wheelchair)?: A Little ?Help needed standing up from a chair using your arms (e.g., wheelchair or bedside chair)?: A Little ?Help needed to walk in hospital room?: A Little ?Help needed climbing 3-5 steps with a railing? : A Little ?6 Click Score: 20 ? ?  ?End of Session Equipment Utilized During Treatment: Gait belt ?Activity Tolerance: Patient tolerated treatment well ?Patient left: with call bell/phone within reach;in chair;with chair alarm set;with  family/visitor present;with nursing/sitter in room ?Nurse Communication: Mobility status ?PT Visit Diagnosis: Difficulty in walking, not elsewhere classified (R26.2);Pain ?Pain - Right/Left: Left ?Pain - part of body: Knee ?  ? ? ?Time: 3235-5732 ?PT Time Calculation (min) (ACUTE ONLY): 45 min ? ?Charges:  $Gait Training: 23-37 mins ?$Therapeutic Exercise: 8-22 mins          ?          ? ?Coolidge Breeze, PT, DPT ?[facility] Department ?Office: [phone] ?Pager: [phone] ? ? ?Coolidge Breeze ?01/29/2022, 10:46 AM ? ?

## 2022-01-29 NOTE — Discharge Summary (Signed)
Patient ID: ?Andrea Crawford ?MRN: [id number] ?DOB/AGE: [birth year] 66 y.o. ? ?Admit date: 01/28/2022 ?Discharge date: 01/29/2022 ? ?Admission Diagnoses:  ?Principal Problem: ?  OA (osteoarthritis) of knee ?Active Problems: ?  Osteoarthritis of left knee ? ? ?Discharge Diagnoses:  ?Same ? ?Past Medical History:  ?Diagnosis Date  ? Anemia 04/2020  ? Drug-induced Parkinson's disease ([place])   ? parkinson like  ? History of complex partial epilepsy   ? Murmur, cardiac   ? Osteoarthritis   ? Osteopenia   ? Pedal edema   ? Seizure disorder ([place])   ? Seizures ([place])   ? ? ?Surgeries: Procedure(s): ?TOTAL KNEE ARTHROPLASTY on 01/28/2022 ?  ?Consultants:  ? ?Discharged Condition: Improved ? ?Hospital Course: Andrea Crawford is an 66 y.o. female who was admitted 01/28/2022 for operative treatment ofOA (osteoarthritis) of knee. Patient has severe unremitting pain that affects sleep, daily activities, and work/hobbies. After pre-op clearance the patient was taken to the operating room on 01/28/2022 and underwent  Procedure(s): ?TOTAL KNEE ARTHROPLASTY.   ? ?Patient was given perioperative antibiotics:  ?Anti-infectives (From admission, onward)  ? ? Start     Dose/Rate Route Frequency Ordered Stop  ? 01/28/22 1830  ceFAZolin (ANCEF) IVPB 2g/100 mL premix       ? 2 g ?200 mL/hr over 30 Minutes Intravenous Every 6 hours 01/28/22 1636 01/29/22 1011  ? 01/28/22 1015  ceFAZolin (ANCEF) IVPB 2g/100 mL premix       ? 2 g ?200 mL/hr over 30 Minutes Intravenous On call to O.R. 01/28/22 1008 01/28/22 1227  ? ?  ?  ? ?Patient was given sequential compression devices, early ambulation, and chemoprophylaxis to prevent DVT. ? ?Patient benefited maximally from hospital stay and there were no complications.   ? ?Recent vital signs: Patient Vitals for the past 24 hrs: ? BP Temp Temp src Pulse Resp SpO2 Height Weight  ?01/29/22 1038 107/68 97.9 ?F (36.6 ?C) Oral 76 16 100 % -- --  ?01/29/22 0534 134/69 97.6 ?F (36.4 ?C) Axillary 68 18 99 % -- --  ?01/29/22  0215 124/63 98.1 ?F (36.7 ?C) Oral (!) 57 16 100 % -- --  ?01/28/22 2300 (!) 141/81 97.8 ?F (36.6 ?C) Oral -- -- 100 % -- --  ?01/28/22 1833 116/74 -- -- 66 17 100 % -- --  ?01/28/22 1627 106/63 (!) 97.5 ?F (36.4 ?C) Oral (!) 47 16 100 % 5' 2.01" (1.575 m) 63 kg  ?01/28/22 1545 114/71 -- -- (!) 49 20 99 % -- --  ?  ? ?Recent laboratory studies:  ?Recent Labs  ?  01/29/22 ?0305  ?WBC 7.5  ?HGB 11.1*  ?HCT 34.1*  ?PLT 145*  ?NA 138  ?K 3.8  ?CL 103  ?CO2 30  ?BUN 12  ?CREATININE 0.68  ?GLUCOSE 128*  ?CALCIUM 8.6*  ? ? ? ?Discharge Medications:   ?Allergies as of 01/29/2022   ?No Known Allergies ?  ? ?  ?Medication List  ?  ? ?TAKE these medications   ? ?acetaminophen 500 MG tablet ?Commonly known as: TYLENOL ?Take 1,000 mg by mouth every 6 (six) hours as needed for headache (pain). ?  ?amantadine 100 MG capsule ?Commonly known as: SYMMETREL ?Take 100 mg by mouth 2 (two) times daily. 930 & 2130 ?  ?ARIPiprazole 30 MG tablet ?Commonly known as: ABILIFY ?Take 30 mg by mouth at bedtime. 2130 ?  ?aspirin 325 MG EC tablet ?Take 1 tablet (325 mg total) by mouth 2 (two) times daily for 20  days. Then take one 81 mg aspirin once a day for three weeks. Then discontinue aspirin. ?  ?escitalopram 10 MG tablet ?Commonly known as: LEXAPRO ?Take 10 mg by mouth at bedtime. 2130 ?  ?lacosamide 50 MG Tabs tablet ?Commonly known as: VIMPAT ?Take 50-150 mg by mouth See admin instructions. Pt takes '50mg'$  q am; '50mg'$  @ noon; then '150mg'$  at bedtime. BRAND NAME ONLY ?*Brand Name * ?  ?lamoTRIgine 200 MG tablet ?Commonly known as: LAMICTAL ?Take 200-400 mg by mouth See admin instructions. '200mg'$  q am; '200mg'$  at noon; then '400mg'$  at bedtime. ?  ?levETIRAcetam 1000 MG tablet ?Commonly known as: KEPPRA ?Take 500-1,000 mg by mouth See admin instructions. '500mg'$  q am; '500mg'$  at noon; then '1000mg'$  qhs. ?  ?methocarbamol 500 MG tablet ?Commonly known as: ROBAXIN ?Take 1 tablet (500 mg total) by mouth every 6 (six) hours as needed for muscle spasms. ?   ?multivitamin with minerals tablet ?Take 1 tablet by mouth daily. 1230 ?  ?omeprazole 40 MG capsule ?Commonly known as: PRILOSEC ?Take 40 mg by mouth at bedtime. 0930 ?  ?Onfi 10 MG tablet ?Generic drug: cloBAZam ?Take 5-15 mg by mouth See admin instructions. Take 5 MG in the morning at 0930 and  (15 MG) in the evening at 2130 ?  ?oxyCODONE 5 MG immediate release tablet ?Commonly known as: Oxy IR/ROXICODONE ?Take 1-2 tablets (5-10 mg total) by mouth every 6 (six) hours as needed for severe pain. ?  ?PRESCRIPTION MEDICATION ?Inhale into the lungs at bedtime. CPAP ?  ? ?  ? ?  ?  ? ? ?  ?Discharge Care Instructions  ?(From admission, onward)  ?  ? ? ?  ? ?  Start     Ordered  ? 01/29/22 0000  Weight bearing as tolerated       ? 01/29/22 0721  ? 01/29/22 0000  Change dressing       ?Comments: You may remove the bulky bandage (ACE wrap and gauze) two days after surgery. You will have an adhesive waterproof bandage underneath. Leave this in place until your first follow-up appointment.  ? 01/29/22 0721  ? ?  ?  ? ?  ? ? ?Diagnostic Studies: No results found. ? ?Disposition: Discharge disposition: 01-Home or Self Care ? ? ? ? ? ? ?Discharge Instructions   ? ? Call MD / Call 911   Complete by: As directed ?  ? If you experience chest pain or shortness of breath, CALL 911 and be transported to the hospital emergency room.  If you develope a fever above 101 F, pus (white drainage) or increased drainage or redness at the wound, or calf pain, call your surgeon's office.  ? Change dressing   Complete by: As directed ?  ? You may remove the bulky bandage (ACE wrap and gauze) two days after surgery. You will have an adhesive waterproof bandage underneath. Leave this in place until your first follow-up appointment.  ? Constipation Prevention   Complete by: As directed ?  ? Drink plenty of fluids.  Prune juice may be helpful.  You may use a stool softener, such as Colace (over the counter) 100 mg twice a day.  Use MiraLax (over  the counter) for constipation as needed.  ? Diet - low sodium heart healthy   Complete by: As directed ?  ? Do not put a pillow under the knee. Place it under the heel.   Complete by: As directed ?  ? Driving restrictions   Complete  by: As directed ?  ? No driving for two weeks  ? Post-operative opioid taper instructions:   Complete by: As directed ?  ? POST-OPERATIVE OPIOID TAPER INSTRUCTIONS: ?It is important to wean off of your opioid medication as soon as possible. If you do not need pain medication after your surgery it is ok to stop day one. ?Opioids include: ?Codeine, Hydrocodone(Norco, Vicodin), Oxycodone(Percocet, oxycontin) and hydromorphone amongst others.  ?Long term and even short term use of opiods can cause: ?Increased pain response ?Dependence ?Constipation ?Depression ?Respiratory depression ?And more.  ?Withdrawal symptoms can include ?Flu like symptoms ?Nausea, vomiting ?And more ?Techniques to manage these symptoms ?Hydrate well ?Eat regular healthy meals ?Stay active ?Use relaxation techniques(deep breathing, meditating, yoga) ?Do Not substitute Alcohol to help with tapering ?If you have been on opioids for less than two weeks and do not have pain than it is ok to stop all together.  ?Plan to wean off of opioids ?This plan should start within one week post op of your joint replacement. ?Maintain the same interval or time between taking each dose and first decrease the dose.  ?Cut the total daily intake of opioids by one tablet each day ?Next start to increase the time between doses. ?The last dose that should be eliminated is the evening dose.  ? ?  ? TED hose   Complete by: As directed ?  ? Use stockings (TED hose) for three weeks on both leg(s).  You may remove them at night for sleeping.  ? Weight bearing as tolerated   Complete by: As directed ?  ? ?  ? ? ? Follow-up Information   ? ? Gaynelle Arabian, MD. Go on 02/12/2022.   ?Specialty: Orthopedic Surgery ?Why: You are scheduled for first post  op appointment on Tuesday April 18th at 1:30pm. ?Contact information: ?[address] ?[address] ?[place] [address] ?[phone] ? ? ?  ?  ? ? [facility] Go on 01/31/2022.   ?Why: You are scheduled

## 2022-01-29 NOTE — Progress Notes (Addendum)
? ?  Subjective: ?1 Day Post-Op Procedure(s) (LRB): ?TOTAL KNEE ARTHROPLASTY (Left) ?Patient reports pain as mild.   ?Patient seen in rounds by Dr. Wynelle Link. ?Patient is well, and has had no acute complaints or problems No issues overnight. Denies chest pain, SOB, or calf pain. Foley catheter removed this AM.  ?We will continue therapy today. ? ?Objective: ?Vital signs in last 24 hours: ?Temp:  [97.5 ?F (36.4 ?C)-98.1 ?F (36.7 ?C)] 97.6 ?F (36.4 ?C) (04/04 0534) ?Pulse Rate:  [47-68] 68 (04/04 0534) ?Resp:  [12-21] 18 (04/04 0534) ?BP: (91-141)/(53-81) 134/69 (04/04 0534) ?SpO2:  [95 %-100 %] 99 % (04/04 0534) ?Weight:  [63 kg] 63 kg (04/03 1627) ? ?Intake/Output from previous day: ? ?Intake/Output Summary (Last 24 hours) at 01/29/2022 0718 ?Last data filed at 01/29/2022 6720 ?Gross per 24 hour  ?Intake 2641.25 ml  ?Output 1725 ml  ?Net 916.25 ml  ?  ? ?Intake/Output this shift: ?No intake/output data recorded. ? ?Labs: ?Recent Labs  ?  01/29/22 ?0305  ?HGB 11.1*  ? ?Recent Labs  ?  01/29/22 ?0305  ?WBC 7.5  ?RBC 3.61*  ?HCT 34.1*  ?PLT 145*  ? ?Recent Labs  ?  01/29/22 ?0305  ?NA 138  ?K 3.8  ?CL 103  ?CO2 30  ?BUN 12  ?CREATININE 0.68  ?GLUCOSE 128*  ?CALCIUM 8.6*  ? ?No results for input(s): LABPT, INR in the last 72 hours. ? ?Exam: ?General - Patient is Alert and Oriented ?Extremity - Neurologically intact ?Neurovascular intact ?Sensation intact distally ?Dorsiflexion/Plantar flexion intact ?Dressing - dressing C/D/I ?Motor Function - intact, moving foot and toes well on exam.  ? ?Past Medical History:  ?Diagnosis Date  ? Anemia 04/2020  ? Drug-induced Parkinson's disease ([place])   ? parkinson like  ? History of complex partial epilepsy   ? Murmur, cardiac   ? Osteoarthritis   ? Osteopenia   ? Pedal edema   ? Seizure disorder ([place])   ? Seizures ([place])   ? ? ?Assessment/Plan: ?1 Day Post-Op Procedure(s) (LRB): ?TOTAL KNEE ARTHROPLASTY (Left) ?Principal Problem: ?  OA (osteoarthritis) of knee ?Active Problems: ?   Osteoarthritis of left knee ? ?Estimated body mass index is 25.4 kg/m? as calculated from the following: ?  Height as of this encounter: 5' 2.01" (1.575 m). ?  Weight as of this encounter: 63 kg. ?Advance diet ?Up with therapy ?D/C IV fluids ? ? ?Patient's anticipated LOS is less than 2 midnights, meeting these requirements: ?- Lives within 1 hour of care ?- Has a competent adult at home to recover with post-op recover ?- NO history of ? - Chronic pain requiring opiods ? - Diabetes ? - Coronary Artery Disease ? - Heart failure ? - Heart attack ? - Stroke ? - DVT/VTE ? - Cardiac arrhythmia ? - Respiratory Failure/COPD ? - Renal failure ? - Anemia ? - Advanced Liver disease ? ?DVT Prophylaxis - Aspirin ?Weight bearing as tolerated. ?Continue therapy. ? ?Plan is to go Home after hospital stay. ?Plan for discharge later today if progresses with therapy and meeting goals. Will need at least 2 sessions. ?Scheduled [facility] O[facility]T at [facility]. ?Follow-up in the office in 2 weeks. ? ?The PDMP database was reviewed today prior to any opioid medications being prescribed to this patient. ? ?Theresa Duty, PA-C ?Orthopedic Surgery ?[phone] ?01/29/2022, 7:18 AM ? ?

## 2022-01-29 NOTE — TOC Transition Note (Signed)
Transition of Care (TOC) - CM/SW Discharge Note ? ? ?Patient Details  ?Name: Andrea Crawford ?MRN: [id number] ?Date of Birth: 1956-08-13 ? ?Transition of Care (TOC) CM/SW Contact:  ?Sean Macwilliams, LCSW ?Phone Number: ?01/29/2022, 11:01 AM ? ? ?Clinical Narrative:    ?Met briefly with pt and confirming she has DME at home.  Plan for OPPT at [facility]. No TOC needs. ? ? ?Final next level of care: OP Rehab ?Barriers to Discharge: No Barriers Identified ? ? ?Patient Goals and CMS Choice ?Patient states their goals for this hospitalization and ongoing recovery are:: return home ?  ?  ? ?Discharge Placement ?  ?           ?  ?  ?  ?  ? ?Discharge Plan and Services ?  ?  ?           ?DME Arranged: N/A ?DME Agency: NA ?  ?  ?  ?  ?  ?  ?  ?  ? ?Social Determinants of Health (SDOH) Interventions ?  ? ? ?Readmission Risk Interventions ? ?  06/04/2020  ?  2:18 PM  ?Readmission Risk Prevention Plan  ?Transportation Screening Complete  ? ? ? ? ? ?

## 2022-01-31 DIAGNOSIS — M25562 Pain in left knee: Secondary | ICD-10-CM | POA: Diagnosis not present

## 2022-02-01 ENCOUNTER — Inpatient Hospital Stay (HOSPITAL_COMMUNITY)
Admission: EM | Admit: 2022-02-01 | Discharge: 2022-02-05 | DRG: 071 | Disposition: A | Discharge status: Home Health | Payer: Medicare Other | Attending: General Practice | Admitting: General Practice

## 2022-02-01 ENCOUNTER — Encounter (HOSPITAL_COMMUNITY): Payer: Self-pay | Admitting: Radiology

## 2022-02-01 ENCOUNTER — Emergency Department (HOSPITAL_COMMUNITY): Payer: Medicare Other

## 2022-02-01 ENCOUNTER — Observation Stay (HOSPITAL_COMMUNITY): Payer: Medicare Other

## 2022-02-01 ENCOUNTER — Other Ambulatory Visit: Payer: Self-pay

## 2022-02-01 DIAGNOSIS — K117 Disturbances of salivary secretion: Secondary | ICD-10-CM | POA: Diagnosis present

## 2022-02-01 DIAGNOSIS — R4182 Altered mental status, unspecified: Secondary | ICD-10-CM | POA: Diagnosis not present

## 2022-02-01 DIAGNOSIS — Z7982 Long term (current) use of aspirin: Secondary | ICD-10-CM | POA: Diagnosis not present

## 2022-02-01 DIAGNOSIS — I6523 Occlusion and stenosis of bilateral carotid arteries: Secondary | ICD-10-CM | POA: Diagnosis not present

## 2022-02-01 DIAGNOSIS — F209 Schizophrenia, unspecified: Secondary | ICD-10-CM | POA: Diagnosis present

## 2022-02-01 DIAGNOSIS — G9340 Encephalopathy, unspecified: Principal | ICD-10-CM | POA: Diagnosis present

## 2022-02-01 DIAGNOSIS — G459 Transient cerebral ischemic attack, unspecified: Secondary | ICD-10-CM | POA: Diagnosis not present

## 2022-02-01 DIAGNOSIS — D649 Anemia, unspecified: Secondary | ICD-10-CM | POA: Diagnosis present

## 2022-02-01 DIAGNOSIS — I10 Essential (primary) hypertension: Secondary | ICD-10-CM | POA: Diagnosis present

## 2022-02-01 DIAGNOSIS — R9431 Abnormal electrocardiogram [ECG] [EKG]: Secondary | ICD-10-CM | POA: Diagnosis not present

## 2022-02-01 DIAGNOSIS — Z79899 Other long term (current) drug therapy: Secondary | ICD-10-CM | POA: Diagnosis not present

## 2022-02-01 DIAGNOSIS — K5900 Constipation, unspecified: Secondary | ICD-10-CM | POA: Diagnosis present

## 2022-02-01 DIAGNOSIS — R29810 Facial weakness: Secondary | ICD-10-CM | POA: Diagnosis not present

## 2022-02-01 DIAGNOSIS — R4701 Aphasia: Secondary | ICD-10-CM | POA: Diagnosis present

## 2022-02-01 DIAGNOSIS — R471 Dysarthria and anarthria: Secondary | ICD-10-CM | POA: Diagnosis present

## 2022-02-01 DIAGNOSIS — R011 Cardiac murmur, unspecified: Secondary | ICD-10-CM | POA: Diagnosis not present

## 2022-02-01 DIAGNOSIS — W19XXXA Unspecified fall, initial encounter: Secondary | ICD-10-CM | POA: Diagnosis present

## 2022-02-01 DIAGNOSIS — G4733 Obstructive sleep apnea (adult) (pediatric): Secondary | ICD-10-CM | POA: Diagnosis present

## 2022-02-01 DIAGNOSIS — G40209 Localization-related (focal) (partial) symptomatic epilepsy and epileptic syndromes with complex partial seizures, not intractable, without status epilepticus: Secondary | ICD-10-CM | POA: Diagnosis present

## 2022-02-01 DIAGNOSIS — R0609 Other forms of dyspnea: Secondary | ICD-10-CM | POA: Diagnosis not present

## 2022-02-01 DIAGNOSIS — Z96652 Presence of left artificial knee joint: Secondary | ICD-10-CM | POA: Diagnosis present

## 2022-02-01 DIAGNOSIS — G2119 Other drug induced secondary parkinsonism: Secondary | ICD-10-CM | POA: Diagnosis present

## 2022-02-01 DIAGNOSIS — M1990 Unspecified osteoarthritis, unspecified site: Secondary | ICD-10-CM | POA: Diagnosis present

## 2022-02-01 DIAGNOSIS — Z20822 Contact with and (suspected) exposure to covid-19: Secondary | ICD-10-CM | POA: Diagnosis present

## 2022-02-01 DIAGNOSIS — L821 Other seborrheic keratosis: Secondary | ICD-10-CM | POA: Insufficient documentation

## 2022-02-01 DIAGNOSIS — G40909 Epilepsy, unspecified, not intractable, without status epilepticus: Secondary | ICD-10-CM | POA: Diagnosis not present

## 2022-02-01 DIAGNOSIS — E876 Hypokalemia: Secondary | ICD-10-CM | POA: Diagnosis not present

## 2022-02-01 DIAGNOSIS — R569 Unspecified convulsions: Secondary | ICD-10-CM | POA: Diagnosis not present

## 2022-02-01 DIAGNOSIS — M47812 Spondylosis without myelopathy or radiculopathy, cervical region: Secondary | ICD-10-CM | POA: Diagnosis not present

## 2022-02-01 DIAGNOSIS — Z8673 Personal history of transient ischemic attack (TIA), and cerebral infarction without residual deficits: Secondary | ICD-10-CM | POA: Diagnosis not present

## 2022-02-01 DIAGNOSIS — Z96659 Presence of unspecified artificial knee joint: Secondary | ICD-10-CM | POA: Diagnosis not present

## 2022-02-01 DIAGNOSIS — D72829 Elevated white blood cell count, unspecified: Secondary | ICD-10-CM | POA: Diagnosis present

## 2022-02-01 DIAGNOSIS — G9389 Other specified disorders of brain: Secondary | ICD-10-CM | POA: Diagnosis not present

## 2022-02-01 LAB — CBC WITH DIFFERENTIAL/PLATELET
Abs Immature Granulocytes: 0.03 10*3/uL (ref 0.00–0.07)
Basophils Absolute: 0 10*3/uL (ref 0.0–0.1)
Basophils Relative: 0 %
Eosinophils Absolute: 0 10*3/uL (ref 0.0–0.5)
Eosinophils Relative: 1 %
HCT: 26.7 % — ABNORMAL LOW (ref 36.0–46.0)
Hemoglobin: 8.8 g/dL — ABNORMAL LOW (ref 12.0–15.0)
Immature Granulocytes: 1 %
Lymphocytes Relative: 10 %
Lymphs Abs: 0.6 10*3/uL — ABNORMAL LOW (ref 0.7–4.0)
MCH: 31.1 pg (ref 26.0–34.0)
MCHC: 33 g/dL (ref 30.0–36.0)
MCV: 94.3 fL (ref 80.0–100.0)
Monocytes Absolute: 0.5 10*3/uL (ref 0.1–1.0)
Monocytes Relative: 9 %
Neutro Abs: 4.7 10*3/uL (ref 1.7–7.7)
Neutrophils Relative %: 79 %
Platelets: 173 10*3/uL (ref 150–400)
RBC: 2.83 MIL/uL — ABNORMAL LOW (ref 3.87–5.11)
RDW: 13.6 % (ref 11.5–15.5)
WBC: 5.9 10*3/uL (ref 4.0–10.5)
nRBC: 0 % (ref 0.0–0.2)

## 2022-02-01 LAB — I-STAT CHEM 8, ED
BUN: 20 mg/dL (ref 8–23)
Calcium, Ion: 1.09 mmol/L — ABNORMAL LOW (ref 1.15–1.40)
Chloride: 97 mmol/L — ABNORMAL LOW (ref 98–111)
Creatinine, Ser: 0.5 mg/dL (ref 0.44–1.00)
Glucose, Bld: 122 mg/dL — ABNORMAL HIGH (ref 70–99)
HCT: 23 % — ABNORMAL LOW (ref 36.0–46.0)
Hemoglobin: 7.8 g/dL — ABNORMAL LOW (ref 12.0–15.0)
Potassium: 3.8 mmol/L (ref 3.5–5.1)
Sodium: 137 mmol/L (ref 135–145)
TCO2: 32 mmol/L (ref 22–32)

## 2022-02-01 LAB — COMPREHENSIVE METABOLIC PANEL
ALT: 15 U/L (ref 0–44)
AST: 21 U/L (ref 15–41)
Albumin: 3.5 g/dL (ref 3.5–5.0)
Alkaline Phosphatase: 68 U/L (ref 38–126)
Anion gap: 6 (ref 5–15)
BUN: 19 mg/dL (ref 8–23)
CO2: 31 mmol/L (ref 22–32)
Calcium: 8.4 mg/dL — ABNORMAL LOW (ref 8.9–10.3)
Chloride: 101 mmol/L (ref 98–111)
Creatinine, Ser: 0.57 mg/dL (ref 0.44–1.00)
GFR, Estimated: >60 mL/min (ref >60)
Glucose, Bld: 125 mg/dL — ABNORMAL HIGH (ref 70–99)
Potassium: 3.4 mmol/L — ABNORMAL LOW (ref 3.5–5.1)
Sodium: 138 mmol/L (ref 135–145)
Total Bilirubin: 0.7 mg/dL (ref 0.3–1.2)
Total Protein: 6.5 g/dL (ref 6.5–8.1)

## 2022-02-01 LAB — URINALYSIS, ROUTINE W REFLEX MICROSCOPIC
Bilirubin Urine: NEGATIVE
Glucose, UA: NEGATIVE mg/dL
Hgb urine dipstick: NEGATIVE
Ketones, ur: 5 mg/dL — AB
Leukocytes,Ua: NEGATIVE
Nitrite: NEGATIVE
Protein, ur: NEGATIVE mg/dL
Specific Gravity, Urine: 1.012 (ref 1.005–1.030)
pH: 6 (ref 5.0–8.0)

## 2022-02-01 LAB — RESP PANEL BY RT-PCR (FLU A&B, COVID) ARPGX2
Influenza A by PCR: NEGATIVE
Influenza B by PCR: NEGATIVE
SARS Coronavirus 2 by RT PCR: NEGATIVE

## 2022-02-01 LAB — BLOOD GAS, VENOUS
Acid-Base Excess: 6.9 mmol/L — ABNORMAL HIGH (ref 0.0–2.0)
Bicarbonate: 33 mmol/L — ABNORMAL HIGH (ref 20.0–28.0)
O2 Saturation: 73.2 %
Patient temperature: 37
pCO2, Ven: 52 mmHg (ref 44–60)
pH, Ven: 7.41 (ref 7.25–7.43)
pO2, Ven: 38 mmHg (ref 32–45)

## 2022-02-01 LAB — BLOOD GAS, ARTERIAL
Acid-Base Excess: 6 mmol/L — ABNORMAL HIGH (ref 0.0–2.0)
Bicarbonate: 30.6 mmol/L — ABNORMAL HIGH (ref 20.0–28.0)
O2 Saturation: 98.9 %
Patient temperature: 37
pCO2 arterial: 43 mmHg (ref 32–48)
pH, Arterial: 7.46 — ABNORMAL HIGH (ref 7.35–7.45)
pO2, Arterial: 151 mmHg — ABNORMAL HIGH (ref 83–108)

## 2022-02-01 LAB — RAPID URINE DRUG SCREEN, HOSP PERFORMED
Amphetamines: NOT DETECTED
Barbiturates: NOT DETECTED
Benzodiazepines: POSITIVE — AB
Cocaine: NOT DETECTED
Opiates: NOT DETECTED
Tetrahydrocannabinol: NOT DETECTED

## 2022-02-01 LAB — ETHANOL: Alcohol, Ethyl (B): <10 mg/dL (ref <10)

## 2022-02-01 LAB — MAGNESIUM: Magnesium: 2.5 mg/dL — ABNORMAL HIGH (ref 1.7–2.4)

## 2022-02-01 LAB — PHOSPHORUS: Phosphorus: 2.5 mg/dL (ref 2.5–4.6)

## 2022-02-01 LAB — AMMONIA: Ammonia: 11 umol/L (ref 9–35)

## 2022-02-01 LAB — CBG MONITORING, ED: Glucose-Capillary: 99 mg/dL (ref 70–99)

## 2022-02-01 MED ORDER — ACETAMINOPHEN 325 MG PO TABS: 650.0000 mg | ORAL_TABLET | Freq: Four times a day (QID) | ORAL | Status: DC | PRN

## 2022-02-01 MED ORDER — LAMOTRIGINE 100 MG PO TABS
400.0000 mg | ORAL_TABLET | Freq: Every day | ORAL | Status: DC
  Administered 2022-02-01 – 2022-02-04 (×4): 400 mg via ORAL
  Filled 2022-02-01 (×4): qty 4

## 2022-02-01 MED ORDER — ONDANSETRON HCL 4 MG/2ML IJ SOLN: 4.0000 mg | Freq: Four times a day (QID) | INTRAMUSCULAR | Status: DC | PRN

## 2022-02-01 MED ORDER — AMANTADINE HCL 100 MG PO CAPS
100.0000 mg | ORAL_CAPSULE | Freq: Two times a day (BID) | ORAL | Status: DC
  Administered 2022-02-01 – 2022-02-05 (×8): 100 mg via ORAL
  Filled 2022-02-01 (×9): qty 1

## 2022-02-01 MED ORDER — CLOBAZAM 10 MG PO TABS: 5.0000 mg | ORAL_TABLET | ORAL | Status: DC

## 2022-02-01 MED ORDER — POTASSIUM CHLORIDE CRYS ER 20 MEQ PO TBCR
40.0000 meq | EXTENDED_RELEASE_TABLET | Freq: Once | ORAL | Status: AC
  Administered 2022-02-01: 40 meq via ORAL
  Filled 2022-02-01: qty 2

## 2022-02-01 MED ORDER — LACTATED RINGERS IV SOLN: INTRAVENOUS | Status: DC

## 2022-02-01 MED ORDER — CLOBAZAM 10 MG PO TABS
5.0000 mg | ORAL_TABLET | Freq: Every day | ORAL | Status: DC
  Administered 2022-02-02 – 2022-02-05 (×4): 5 mg via ORAL
  Filled 2022-02-01 (×4): qty 1

## 2022-02-01 MED ORDER — ACETAMINOPHEN 650 MG RE SUPP: 650.0000 mg | Freq: Four times a day (QID) | RECTAL | Status: DC | PRN

## 2022-02-01 MED ORDER — CLOBAZAM 10 MG PO TABS
15.0000 mg | ORAL_TABLET | Freq: Every day | ORAL | Status: DC
  Administered 2022-02-01 – 2022-02-04 (×4): 15 mg via ORAL
  Filled 2022-02-01 (×4): qty 2

## 2022-02-01 MED ORDER — ESCITALOPRAM OXALATE 10 MG PO TABS
10.0000 mg | ORAL_TABLET | Freq: Every day | ORAL | Status: DC
  Administered 2022-02-01 – 2022-02-04 (×4): 10 mg via ORAL
  Filled 2022-02-01 (×4): qty 1

## 2022-02-01 MED ORDER — LEVETIRACETAM 500 MG PO TABS: 500.0000 mg | ORAL_TABLET | ORAL | Status: DC

## 2022-02-01 MED ORDER — ASPIRIN EC 325 MG PO TBEC: 325.0000 mg | DELAYED_RELEASE_TABLET | Freq: Two times a day (BID) | ORAL | Status: DC

## 2022-02-01 MED ORDER — LACOSAMIDE 50 MG PO TABS
50.0000 mg | ORAL_TABLET | ORAL | Status: DC
  Administered 2022-02-02 – 2022-02-05 (×8): 50 mg via ORAL
  Filled 2022-02-01 (×8): qty 1

## 2022-02-01 MED ORDER — LEVETIRACETAM 500 MG PO TABS
500.0000 mg | ORAL_TABLET | ORAL | Status: DC
  Administered 2022-02-02 – 2022-02-05 (×8): 500 mg via ORAL
  Filled 2022-02-01 (×8): qty 1

## 2022-02-01 MED ORDER — PANTOPRAZOLE SODIUM 40 MG PO TBEC
40.0000 mg | DELAYED_RELEASE_TABLET | Freq: Every day | ORAL | Status: DC
Start: 2022-02-02 — End: 2022-02-05
  Administered 2022-02-02 – 2022-02-05 (×4): 40 mg via ORAL
  Filled 2022-02-01 (×4): qty 1

## 2022-02-01 MED ORDER — LACOSAMIDE 50 MG PO TABS: 50.0000 mg | ORAL_TABLET | ORAL | Status: DC

## 2022-02-01 MED ORDER — LACTATED RINGERS IV BOLUS
1000.0000 mL | Freq: Once | INTRAVENOUS | Status: AC
  Administered 2022-02-01: 1000 mL via INTRAVENOUS

## 2022-02-01 MED ORDER — ONDANSETRON HCL 4 MG PO TABS: 4.0000 mg | ORAL_TABLET | Freq: Four times a day (QID) | ORAL | Status: DC | PRN

## 2022-02-01 MED ORDER — LACOSAMIDE 50 MG PO TABS
150.0000 mg | ORAL_TABLET | Freq: Every day | ORAL | Status: DC
  Administered 2022-02-01 – 2022-02-04 (×4): 150 mg via ORAL
  Filled 2022-02-01 (×4): qty 3

## 2022-02-01 MED ORDER — LAMOTRIGINE 100 MG PO TABS: 200.0000 mg | ORAL_TABLET | ORAL | Status: DC

## 2022-02-01 MED ORDER — LAMOTRIGINE 100 MG PO TABS
200.0000 mg | ORAL_TABLET | ORAL | Status: DC
  Administered 2022-02-02 – 2022-02-05 (×8): 200 mg via ORAL
  Filled 2022-02-01 (×8): qty 2

## 2022-02-01 MED ORDER — ENOXAPARIN SODIUM 40 MG/0.4ML IJ SOSY
40.0000 mg | PREFILLED_SYRINGE | INTRAMUSCULAR | Status: DC
  Administered 2022-02-01 – 2022-02-04 (×4): 40 mg via SUBCUTANEOUS
  Filled 2022-02-01 (×4): qty 0.4

## 2022-02-01 MED ORDER — ARIPIPRAZOLE 10 MG PO TABS
30.0000 mg | ORAL_TABLET | ORAL | Status: DC
  Administered 2022-02-01 – 2022-02-04 (×4): 30 mg via ORAL
  Filled 2022-02-01 (×4): qty 3

## 2022-02-01 MED ORDER — LEVETIRACETAM 500 MG PO TABS
1000.0000 mg | ORAL_TABLET | Freq: Every day | ORAL | Status: DC
  Administered 2022-02-01 – 2022-02-04 (×4): 1000 mg via ORAL
  Filled 2022-02-01 (×4): qty 2

## 2022-02-01 NOTE — H&P (Signed)
?History and Physical  ? ? ?Patient: Andrea Crawford DOB: 1956/02/15 ?DOA: 02/01/2022 ?DOS: the patient was seen and examined on 02/01/2022 ?PCP: Marda Stalker, PA-C  ?Patient coming from: Home ? ?Chief Complaint:  ?Chief Complaint  ?Patient presents with  ? Altered Mental Status  ? ?HPI: Andrea Crawford is a 66 y.o. female with medical history significant of copper deficiency, drug-induced Parkinson, chronic normocytic anemia, history of complex partial epilepsy requiring multiple antiepileptic agents, osteoarthritis, osteopenia, pedal edema who underwent an uneventful left TKA on Monday but is being brought today to the emergency department by her husband due to altered mental status since Wednesday that has become progressively worse over the past 2 days.  She had a fall last night with sustaining any injuries.  She stated she did not hit her head.  Her confusion was relatively mild until around 1045 this morning when it became a lot worse.  She has some trouble knowing time, date and situation.  Her speech is mildly slower at baseline but she was slurring her words a lot more according to her husband.    She has not taken oxycodone or the muscle relaxant since Wednesday.  She had a similar episode in the past when she was walking with her husband, which was not as pronounced at this episode and subsequently resolved.  There has not been any complaints of fever, chills, sore throat, rhinorrhea, dyspnea, chest pain or lightheadedness.  No abdominal pain, diarrhea, but had an episode of emesis this morning and has been constipated for several days.  No flank pain, dysuria, frequency or hematuria. ? ?Her husband is who provide most of the story.  However, he often gets distracted and elaborates on other past issues in a very detailed fashion. ? ?ED course: Initial vital signs were temperature 98.9 ?F, pulse 74, respirations 17, BP 128/56 mmHg and O2 sat 100% on room air.  The patient received 1000  mL of LR bolus. ? ?Labwork: Her urinalysis showed ketonuria 5 mg/dL but was otherwise normal.  Arterial blood gas showed increased pH at 7.46, increased PO2 at 151 mmHg and increased bicarbonate at 30.6/acid-base excess 6.0 mmol/L. ? ?Imaging: One-view portable chest radiograph showed no active disease.  CT head without contrast shows cerebral and cerebellar atrophy but no acute intracranial abnormality.  Brain MRI imaging still in ordered status. ?  ?Review of Systems: As mentioned in the history of present illness. All other systems reviewed and are negative. ?Past Medical History:  ?Diagnosis Date  ? Anemia 04/2020  ? Drug-induced Parkinson's disease ([place])   ? parkinson like  ? History of complex partial epilepsy   ? Murmur, cardiac   ? Osteoarthritis   ? Osteopenia   ? Pedal edema   ? Seizure disorder ([place])   ? Seizures ([place])   ? ?Past Surgical History:  ?Procedure Laterality Date  ? ESOPHAGOGASTRODUODENOSCOPY    ? TONSILLECTOMY    ? as a child  ? TOTAL KNEE ARTHROPLASTY Left 01/28/2022  ? Procedure: TOTAL KNEE ARTHROPLASTY;  Surgeon: Gaynelle Arabian, MD;  Location: [facility];  Service: Orthopedics;  Laterality: Left;  ? ?Social History:  reports that she has never smoked. She has never used smokeless tobacco. She reports that she does not drink alcohol and does not use drugs. ? ?No Known Allergies ? ?Family History  ?Problem Relation Age of Onset  ? Arthritis/Rheumatoid Mother   ? Melanoma Sister   ? Melanoma Brother   ? Lung cancer Brother   ?  Breast cancer Paternal Aunt   ? Heart attack Father   ? Diabetes Sister   ? Seizures Neg Hx   ? ? ?Prior to Admission medications   ?Medication Sig Start Date End Date Taking? Authorizing Provider  ?acetaminophen (TYLENOL) 500 MG tablet Take 1,000 mg by mouth every 6 (six) hours as needed for headache (pain).    [provider]  ?amantadine (SYMMETREL) 100 MG capsule Take 100 mg by mouth 2 (two) times daily. 930 & 2130 01/10/19   [provider]  ?ARIPiprazole  (ABILIFY) 30 MG tablet Take 30 mg by mouth at bedtime. 2130    [provider]  ?aspirin EC 325 MG EC tablet Take 1 tablet (325 mg total) by mouth 2 (two) times daily for 20 days. Then take one 81 mg aspirin once a day for three weeks. Then discontinue aspirin. 01/29/22 02/18/22  Edmisten, Ok Anis, PA  ?cloBAZam (ONFI) 10 MG tablet Take 5-15 mg by mouth See admin instructions. Take 5 MG in the morning at 0930 and  (15 MG) in the evening at 2130    [provider]  ?escitalopram (LEXAPRO) 10 MG tablet Take 10 mg by mouth at bedtime. 2130 12/04/20   [provider]  ?lacosamide (VIMPAT) 50 MG TABS tablet Take 50-150 mg by mouth See admin instructions. Pt takes '50mg'$  q am; '50mg'$  @ noon; then '150mg'$  at bedtime. BRAND NAME ONLY ?*Brand Name *    [provider]  ?lamoTRIgine (LAMICTAL) 200 MG tablet Take 200-400 mg by mouth See admin instructions. '200mg'$  q am; '200mg'$  at noon; then '400mg'$  at bedtime.    [provider]  ?levETIRAcetam (KEPPRA) 1000 MG tablet Take 500-1,000 mg by mouth See admin instructions. '500mg'$  q am; '500mg'$  at noon; then '1000mg'$  qhs.    [provider]  ?methocarbamol (ROBAXIN) 500 MG tablet Take 1 tablet (500 mg total) by mouth every 6 (six) hours as needed for muscle spasms. 01/29/22   Edmisten, Ok Anis, PA  ?Multiple Vitamins-Minerals (MULTIVITAMIN WITH MINERALS) tablet Take 1 tablet by mouth daily. 1230    [provider]  ?omeprazole (PRILOSEC) 40 MG capsule Take 40 mg by mouth at bedtime. 0930 01/10/19   [provider]  ?oxyCODONE (OXY IR/ROXICODONE) 5 MG immediate release tablet Take 1-2 tablets (5-10 mg total) by mouth every 6 (six) hours as needed for severe pain. 01/29/22   Edmisten, Ok Anis, PA  ?PRESCRIPTION MEDICATION Inhale into the lungs at bedtime. CPAP    [provider]  ? ? ?Physical Exam: ?Vitals:  ? 02/01/22 1615 02/01/22 1700 02/01/22 1730 02/01/22 1804  ?BP: 124/75 (!) 108/57 105/60 114/64  ?Pulse: 70 79 71 68   ?Resp: 16 (!) 22 (!) 21   ?Temp:   98.5 ?F (36.9 ?C) 98 ?F (36.7 ?C)  ?TempSrc:   Oral Oral  ?SpO2: 100% 100% 100% 100%  ?Weight:    65 kg  ?Height:    '5\' 2"'$  (1.575 m)  ? ?Physical Exam ?Vitals and nursing note reviewed.  ?HENT:  ?   Head: Normocephalic.  ?   Mouth/Throat:  ?   Mouth: Mucous membranes are dry.  ?Eyes:  ?   General: No scleral icterus. ?   Pupils: Pupils are equal, round, and reactive to light.  ?Neck:  ?   Vascular: No JVD.  ?Cardiovascular:  ?   Rate and Rhythm: Normal rate and regular rhythm.  ?   Heart sounds: S1 normal and S2 normal.  ?Pulmonary:  ?  Effort: Pulmonary effort is normal.  ?   Breath sounds: Normal breath sounds. No wheezing, rhonchi or rales.  ?Abdominal:  ?   General: Bowel sounds are normal. There is no distension.  ?   Palpations: Abdomen is soft.  ?   Tenderness: There is no abdominal tenderness.  ?Musculoskeletal:  ?   Cervical back: Neck supple.  ?   Left knee: Swelling and erythema present. Decreased range of motion. Tenderness present.  ?   Comments: Left TKA seems to be healing well.  No bleeding or discharge seen.  ?Skin: ?   General: Skin is warm and dry.  ?Neurological:  ?   General: No focal deficit present.  ?   Mental Status: She is alert and oriented to person, place, and time.  ?Psychiatric:     ?   Mood and Affect: Mood normal.     ?   Behavior: Behavior normal.  ? ? ?Data Reviewed: ? ?There are no new results to review at this time. ? ?Assessment and Plan: ?Principal Problem: ?  Acute encephalopathy ?No clear etiology. ?Observation/PCU. ?MRI imaging still pending. ?Continue IV fluids. ?Continue frequent neurochecks. ?Avoid sedating medications. ?Consider neurology evaluation if no improvement. ?Consider [facility] evaluation as well. ? ?Active Problems: ?  Epilepsy ([place]) ?Continue Vimpat, Lamictal and Keppra home regimen. ?Check Lamictal level while the patient is in the hospital. ? ?  Normocytic anemia ?Monitor H&H. ?Transfuse as needed. ? ?   Hypokalemia ?Replacing. ?Check potassium and magnesium level. ? ?  S/P left total knee replacement ?Hold aspirin. ?Analgesics as needed. ?Lovenox for DVT prophylaxis. ? ?  Leukocytosis ?No fever or signs of infection. ? ? ?

## 2022-02-01 NOTE — ED Provider Notes (Signed)
?[facility] DEPT ?Provider Note ? ? ?CSN: [id number] ?Arrival date & time: 02/01/22  1415 ? ?LEVEL 5 CAVEAT - ALTERED MENTAL STATUS  ? ?History ? ?Chief Complaint  ?Patient presents with  ? Altered Mental Status  ? ? ?Andrea Crawford is a 66 y.o. female. ? ?HPI ?66 year old female with a history of seizures, schizophrenia presents with acute altered mental status.  The history is from the husband at the bedside.  It is hard to pin down exactly when this started.  Patient had knee surgery on her left knee earlier this week.  Was doing okay and then last night he heard her fall in another room.  It did not seem like she injured herself.  She might of been a little bit confused last night but she is not "normal" at baseline and he did not notice anything significant.  This morning she seemed to be herself but then sometime before 10:45 AM she started to become confused.  She could not tell the time 1:00 which is abnormal for her.  Seem like she might of had some slurred speech as well.  The mental status changes have progressively worsened. ? ?Triage nurse reports that the patient vomited in route to her room.  Further history from the patient is unobtainable. ? ?Home Medications ?Prior to Admission medications   ?Medication Sig Start Date End Date Taking? Authorizing Provider  ?acetaminophen (TYLENOL) 500 MG tablet Take 1,000 mg by mouth every 6 (six) hours as needed for headache (pain).    [provider]  ?amantadine (SYMMETREL) 100 MG capsule Take 100 mg by mouth 2 (two) times daily. 930 & 2130 01/10/19   [provider]  ?ARIPiprazole (ABILIFY) 30 MG tablet Take 30 mg by mouth at bedtime. 2130    [provider]  ?aspirin EC 325 MG EC tablet Take 1 tablet (325 mg total) by mouth 2 (two) times daily for 20 days. Then take one 81 mg aspirin once a day for three weeks. Then discontinue aspirin. 01/29/22 02/18/22  Edmisten, Ok Anis, PA  ?cloBAZam (ONFI) 10 MG  tablet Take 5-15 mg by mouth See admin instructions. Take 5 MG in the morning at 0930 and  (15 MG) in the evening at 2130    [provider]  ?escitalopram (LEXAPRO) 10 MG tablet Take 10 mg by mouth at bedtime. 2130 12/04/20   [provider]  ?lacosamide (VIMPAT) 50 MG TABS tablet Take 50-150 mg by mouth See admin instructions. Pt takes '50mg'$  q am; '50mg'$  @ noon; then '150mg'$  at bedtime. BRAND NAME ONLY ?*Brand Name *    [provider]  ?lamoTRIgine (LAMICTAL) 200 MG tablet Take 200-400 mg by mouth See admin instructions. '200mg'$  q am; '200mg'$  at noon; then '400mg'$  at bedtime.    [provider]  ?levETIRAcetam (KEPPRA) 1000 MG tablet Take 500-1,000 mg by mouth See admin instructions. '500mg'$  q am; '500mg'$  at noon; then '1000mg'$  qhs.    [provider]  ?methocarbamol (ROBAXIN) 500 MG tablet Take 1 tablet (500 mg total) by mouth every 6 (six) hours as needed for muscle spasms. 01/29/22   Edmisten, Ok Anis, PA  ?Multiple Vitamins-Minerals (MULTIVITAMIN WITH MINERALS) tablet Take 1 tablet by mouth daily. 1230    [provider]  ?omeprazole (PRILOSEC) 40 MG capsule Take 40 mg by mouth at bedtime. 0930 01/10/19   [provider]  ?oxyCODONE (OXY IR/ROXICODONE) 5 MG immediate release tablet Take 1-2 tablets (5-10 mg total) by mouth every 6 (  six) hours as needed for severe pain. 01/29/22   Edmisten, Ok Anis, PA  ?PRESCRIPTION MEDICATION Inhale into the lungs at bedtime. CPAP    [provider]  ?   ? ?Allergies    ?Patient has no known allergies.   ? ?Review of Systems   ?Review of Systems  ?Unable to perform ROS: Mental status change  ? ?Physical Exam ?Updated Vital Signs ?BP 124/75   Pulse 70   Temp 98.9 ?F (37.2 ?C) (Rectal)   Resp 16   SpO2 100%  ?Physical Exam ?Vitals and nursing note reviewed.  ?Constitutional:   ?   Appearance: She is well-developed.  ?HENT:  ?   Head: Normocephalic and atraumatic.  ?   Mouth/Throat:  ?   Mouth: Mucous membranes are dry.   ?Eyes:  ?   Pupils: Pupils are equal, round, and reactive to light.  ?Cardiovascular:  ?   Rate and Rhythm: Normal rate and regular rhythm.  ?   Heart sounds: Normal heart sounds.  ?Pulmonary:  ?   Effort: Pulmonary effort is normal.  ?   Breath sounds: Normal breath sounds.  ?Abdominal:  ?   General: There is no distension.  ?   Palpations: Abdomen is soft.  ?   Tenderness: There is no abdominal tenderness.  ?Musculoskeletal:  ?   Comments: Left knee is diffusely swollen and ecchymotic. However this is no cellulitis or increased warmth.  ?Skin: ?   General: Skin is warm and dry.  ?Neurological:  ?   Mental Status: She is alert.  ?   Comments: Patient moves all 4 extremities equally.  She is awake but seems to be looking at someone or something and quietly talking.  ? ? ?ED Results / Procedures / Treatments   ?Labs ?(all labs ordered are listed, but only abnormal results are displayed) ?Labs Reviewed  ?COMPREHENSIVE METABOLIC PANEL - Abnormal; Notable for the following components:  ?    Result Value  ? Potassium 3.4 (*)   ? Glucose, Bld 125 (*)   ? Calcium 8.4 (*)   ? All other components within normal limits  ?CBC WITH DIFFERENTIAL/PLATELET - Abnormal; Notable for the following components:  ? RBC 2.83 (*)   ? Hemoglobin 8.8 (*)   ? HCT 26.7 (*)   ? Lymphs Abs 0.6 (*)   ? All other components within normal limits  ?BLOOD GAS, VENOUS - Abnormal; Notable for the following components:  ? Bicarbonate 33.0 (*)   ? Acid-Base Excess 6.9 (*)   ? All other components within normal limits  ?BLOOD GAS, ARTERIAL - Abnormal; Notable for the following components:  ? pH, Arterial 7.46 (*)   ? pO2, Arterial 151 (*)   ? Bicarbonate 30.6 (*)   ? Acid-Base Excess 6.0 (*)   ? All other components within normal limits  ?I-STAT CHEM 8, ED - Abnormal; Notable for the following components:  ? Chloride 97 (*)   ? Glucose, Bld 122 (*)   ? Calcium, Ion 1.09 (*)   ? Hemoglobin 7.8 (*)   ? HCT 23.0 (*)   ? All other components within normal  limits  ?RESP PANEL BY RT-PCR (FLU A&B, COVID) [id number]  ?ETHANOL  ?AMMONIA  ?URINALYSIS, ROUTINE W REFLEX MICROSCOPIC  ?RAPID URINE DRUG SCREEN, HOSP PERFORMED  ?CBG MONITORING, ED  ?CBG MONITORING, ED  ? ? ?EKG ?EKG Interpretation ? ?Date/Time:  Friday February 01 2022 14:35:52 EDT ?Ventricular Rate:  75 ?PR Interval:  164 ?QRS Duration: 100 ?QT  Interval:  401 ?QTC Calculation: 448 ?R Axis:   45 ?Text Interpretation: Sinus rhythm Consider left atrial enlargement Abnormal R-wave progression, early transition Left ventricular hypertrophy nonspecific changes similar to Mar 2023 Confirmed by Sherwood Gambler [phone]) on 02/01/2022 3:20:40 PM ? ?Radiology ?CT Head Wo Contrast ? ?Result Date: 02/01/2022 ?CLINICAL DATA:  Mental status changes of unknown cause EXAM: CT HEAD WITHOUT CONTRAST TECHNIQUE: Contiguous axial images were obtained from the base of the skull through the vertex without intravenous contrast. RADIATION DOSE REDUCTION: This exam was performed according to the departmental dose-optimization program which includes automated exposure control, adjustment of the mA and/or kV according to patient size and/or use of iterative reconstruction technique. COMPARISON:  06/02/2020 MRI.  05/30/2020 head CT. FINDINGS: Brain: Cerebral and cerebellar atrophy again identified. No mass lesion, hemorrhage, hydrocephalus, acute infarct, intra-axial, or extra-axial fluid collection. Vascular: No hyperdense vessel or unexpected calcification. Skull: Normal Sinuses/Orbits: Normal imaged portions of the orbits and globes. Clear paranasal sinuses and mastoid air cells. Other: None. IMPRESSION: No acute intracranial abnormality. Cerebral and cerebellar atrophy. Electronically Signed   By: Abigail Miyamoto M.D.   On: 02/01/2022 15:22  ? ?DG Chest Portable 1 View ? ?Result Date: 02/01/2022 ?CLINICAL DATA:  Altered mental status. EXAM: PORTABLE CHEST 1 VIEW COMPARISON:  May 29, 2020. FINDINGS: The heart size and mediastinal contours are within  normal limits. Both lungs are clear. The visualized skeletal structures are unremarkable. IMPRESSION: No active disease. Electronically Signed   By: Marijo Conception M.D.   On: 02/01/2022 15:24   ? ?Procedures ?Proce

## 2022-02-01 NOTE — Progress Notes (Signed)
Pt arrived to unit from ED on stretcher. Slid to bed w/ 3 assist. VSS. Upon getting pt into bed, MRI tech came to take pt to MRI. Pt left unit for MRI at this time.  ?

## 2022-02-02 DIAGNOSIS — Z20822 Contact with and (suspected) exposure to covid-19: Secondary | ICD-10-CM | POA: Diagnosis present

## 2022-02-02 DIAGNOSIS — Z96659 Presence of unspecified artificial knee joint: Secondary | ICD-10-CM

## 2022-02-02 DIAGNOSIS — R0609 Other forms of dyspnea: Secondary | ICD-10-CM | POA: Diagnosis not present

## 2022-02-02 DIAGNOSIS — Z96652 Presence of left artificial knee joint: Secondary | ICD-10-CM | POA: Diagnosis present

## 2022-02-02 DIAGNOSIS — I10 Essential (primary) hypertension: Secondary | ICD-10-CM | POA: Diagnosis present

## 2022-02-02 DIAGNOSIS — I6523 Occlusion and stenosis of bilateral carotid arteries: Secondary | ICD-10-CM | POA: Diagnosis not present

## 2022-02-02 DIAGNOSIS — G40209 Localization-related (focal) (partial) symptomatic epilepsy and epileptic syndromes with complex partial seizures, not intractable, without status epilepticus: Secondary | ICD-10-CM | POA: Diagnosis present

## 2022-02-02 DIAGNOSIS — G4733 Obstructive sleep apnea (adult) (pediatric): Secondary | ICD-10-CM | POA: Diagnosis present

## 2022-02-02 DIAGNOSIS — E876 Hypokalemia: Secondary | ICD-10-CM

## 2022-02-02 DIAGNOSIS — W19XXXA Unspecified fall, initial encounter: Secondary | ICD-10-CM | POA: Diagnosis present

## 2022-02-02 DIAGNOSIS — G9340 Encephalopathy, unspecified: Secondary | ICD-10-CM | POA: Diagnosis present

## 2022-02-02 DIAGNOSIS — G40909 Epilepsy, unspecified, not intractable, without status epilepticus: Secondary | ICD-10-CM

## 2022-02-02 DIAGNOSIS — R29810 Facial weakness: Secondary | ICD-10-CM | POA: Diagnosis present

## 2022-02-02 DIAGNOSIS — D649 Anemia, unspecified: Secondary | ICD-10-CM | POA: Diagnosis present

## 2022-02-02 DIAGNOSIS — R4701 Aphasia: Secondary | ICD-10-CM | POA: Diagnosis present

## 2022-02-02 DIAGNOSIS — R4182 Altered mental status, unspecified: Secondary | ICD-10-CM

## 2022-02-02 DIAGNOSIS — K5900 Constipation, unspecified: Secondary | ICD-10-CM | POA: Diagnosis present

## 2022-02-02 DIAGNOSIS — G459 Transient cerebral ischemic attack, unspecified: Secondary | ICD-10-CM

## 2022-02-02 DIAGNOSIS — F209 Schizophrenia, unspecified: Secondary | ICD-10-CM | POA: Diagnosis present

## 2022-02-02 DIAGNOSIS — M1990 Unspecified osteoarthritis, unspecified site: Secondary | ICD-10-CM | POA: Diagnosis present

## 2022-02-02 DIAGNOSIS — Z8673 Personal history of transient ischemic attack (TIA), and cerebral infarction without residual deficits: Secondary | ICD-10-CM | POA: Diagnosis not present

## 2022-02-02 DIAGNOSIS — R011 Cardiac murmur, unspecified: Secondary | ICD-10-CM | POA: Diagnosis present

## 2022-02-02 DIAGNOSIS — R471 Dysarthria and anarthria: Secondary | ICD-10-CM | POA: Diagnosis present

## 2022-02-02 DIAGNOSIS — K117 Disturbances of salivary secretion: Secondary | ICD-10-CM | POA: Diagnosis present

## 2022-02-02 DIAGNOSIS — M47812 Spondylosis without myelopathy or radiculopathy, cervical region: Secondary | ICD-10-CM | POA: Diagnosis not present

## 2022-02-02 DIAGNOSIS — R569 Unspecified convulsions: Secondary | ICD-10-CM | POA: Diagnosis not present

## 2022-02-02 DIAGNOSIS — G9389 Other specified disorders of brain: Secondary | ICD-10-CM | POA: Diagnosis not present

## 2022-02-02 DIAGNOSIS — D72829 Elevated white blood cell count, unspecified: Secondary | ICD-10-CM | POA: Diagnosis present

## 2022-02-02 DIAGNOSIS — Z7982 Long term (current) use of aspirin: Secondary | ICD-10-CM | POA: Diagnosis not present

## 2022-02-02 DIAGNOSIS — G2119 Other drug induced secondary parkinsonism: Secondary | ICD-10-CM | POA: Diagnosis present

## 2022-02-02 DIAGNOSIS — Z79899 Other long term (current) drug therapy: Secondary | ICD-10-CM | POA: Diagnosis not present

## 2022-02-02 LAB — COMPREHENSIVE METABOLIC PANEL
ALT: 14 U/L (ref 0–44)
AST: 18 U/L (ref 15–41)
Albumin: 2.9 g/dL — ABNORMAL LOW (ref 3.5–5.0)
Alkaline Phosphatase: 61 U/L (ref 38–126)
Anion gap: 6 (ref 5–15)
BUN: 11 mg/dL (ref 8–23)
CO2: 32 mmol/L (ref 22–32)
Calcium: 8.5 mg/dL — ABNORMAL LOW (ref 8.9–10.3)
Chloride: 106 mmol/L (ref 98–111)
Creatinine, Ser: 0.56 mg/dL (ref 0.44–1.00)
GFR, Estimated: >60 mL/min (ref >60)
Glucose, Bld: 96 mg/dL (ref 70–99)
Potassium: 3.8 mmol/L (ref 3.5–5.1)
Sodium: 144 mmol/L (ref 135–145)
Total Bilirubin: 0.9 mg/dL (ref 0.3–1.2)
Total Protein: 5.5 g/dL — ABNORMAL LOW (ref 6.5–8.1)

## 2022-02-02 LAB — CBC
HCT: 24.2 % — ABNORMAL LOW (ref 36.0–46.0)
Hemoglobin: 7.9 g/dL — ABNORMAL LOW (ref 12.0–15.0)
MCH: 30.7 pg (ref 26.0–34.0)
MCHC: 32.6 g/dL (ref 30.0–36.0)
MCV: 94.2 fL (ref 80.0–100.0)
Platelets: 165 10*3/uL (ref 150–400)
RBC: 2.57 MIL/uL — ABNORMAL LOW (ref 3.87–5.11)
RDW: 13.7 % (ref 11.5–15.5)
WBC: 5.5 10*3/uL (ref 4.0–10.5)
nRBC: 0 % (ref 0.0–0.2)

## 2022-02-02 LAB — HIV ANTIBODY (ROUTINE TESTING W REFLEX): HIV Screen 4th Generation wRfx: NONREACTIVE

## 2022-02-02 MED ORDER — SENNOSIDES-DOCUSATE SODIUM 8.6-50 MG PO TABS
1.0000 | ORAL_TABLET | Freq: Every day | ORAL | Status: DC
  Administered 2022-02-03 – 2022-02-04 (×2): 1 via ORAL
  Filled 2022-02-02 (×2): qty 1

## 2022-02-02 MED ORDER — BISACODYL 5 MG PO TBEC
10.0000 mg | DELAYED_RELEASE_TABLET | Freq: Once | ORAL | Status: AC
  Administered 2022-02-02: 10 mg via ORAL
  Filled 2022-02-02: qty 2

## 2022-02-02 NOTE — Assessment & Plan Note (Addendum)
No fever or signs of infection. ?Resolved as of  ?

## 2022-02-02 NOTE — Assessment & Plan Note (Signed)
Consider BH consult if neuro w/u unrevealing  ?

## 2022-02-02 NOTE — Progress Notes (Signed)
? ? ?HISTORY AND PHYSICAL ? ?Patient: Andrea Crawford 66 y.o. female ?MRN: [id number] ? ?Today is hospital day 0 after presenting to ED on 02/01/2022  2:17 PM with  ?Chief Complaint  ?Patient presents with  ? Altered Mental Status  ? ?Andrea Crawford is a 66 y.o. female with medical history significant of copper deficiency, drug-induced Parkinson, chronic normocytic anemia, history of complex partial epilepsy requiring multiple antiepileptic agents, osteoarthritis, osteopenia, pedal edema who underwent an uneventful left TKA on 01/28/22 but was brought to the emergency department by her husband on 02/01/22 due to altered mental status since 01/30/22 that has become progressively worse over the past 2 days ? ?RECORD REVIEW AND HOSPITAL COURSE: ?Underwent total knee replacement 01/28/2022 and discharged 01/29/22, family reports starting 01/30/2022 patient had significantly altered mental status,  ? ?Procedures and Significant Results:  ?none ? ?Consultants:  ?Neurology consulted 02/02/22   ? ? ? ?SUBJECTIVE today 02/02/22 :  ?Patient seen and examined resting comfortably in bed today, family is present at bedside (husband, 2 adult daughters).  Patient states that she is still feeling a bit "wobbly" and confused, family agree that she is still very different from her usual baseline, still slurring her speech and seeming to have word finding difficulty at times. ? ? ? ? ?ASSESSMENT & PLAN ? ?Acute encephalopathy ?No clear etiology, patient remains altered ?Observation/PCU with frequent neurochecks ?MRI brain 02/01/22 shows nothing to explain AMS. ?Continue IV fluids to transition to p.o. ?Avoid sedating medications. ?Neurology is asked to see pt  ?Consider [facility] evaluation as well. ? ?Epilepsy ([place]) ?Continue Vimpat, Lamictal and Keppra home regimen. ?Lamictal level still in process -if Lamictal level comes back elevated, will reduce dose and have patient follow-up with her neurologist.  If it comes back normal and no other  explanation for altered mental status, will consult neurology ? ?Normocytic anemia ?Monitor H&H. ?Transfuse as needed. ? ?Hypokalemia ?Replacing. ?magnesium level - no concerns ? ?S/P left total knee replacement ?Hold aspirin. ?Analgesics as needed. ?Lovenox for DVT prophylaxis. ? ?Leukocytosis ?No fever or signs of infection. ? ?Heart murmur ?Echo pending ?Likely d/t anemia ? ?Altered mental state ?Consider [facility] consult if neuro w/u unrevealing  ? ?  ? ?VTE Ppx: lovenox ?CODE STATUS: FULL ?Admitted from: home ?Expected Dispo: TBD ?Barriers to discharge: neuro recs, lamictal level, Echo, improvement in mentation. May need to consider [facility] consult if neurology w/u nonrevealing.  ?Family communication: husband and daughters at bedside on medical floor  ? ? ? ? ? ? ? ? ? ? ? ? ? ?Objective: ?Vital signs in last 24 hours: ?Temp:  [98 ?F (36.7 ?C)-98.7 ?F (37.1 ?C)] 98.6 ?F (37 ?C) (04/08 1243) ?Pulse Rate:  [68-86] 78 (04/08 1243) ?Resp:  [16-21] 19 (04/08 0601) ?BP: (94-115)/(53-89) 98/60 (04/08 1243) ?SpO2:  [98 %-100 %] 99 % (04/08 1243) ?Weight:  [65 kg] 65 kg (04/07 1804) ?Weight change:  ?  ? ?Intake/Output from previous day: ?04/07 0701 - 04/08 0700 ?In: -  ?Out: 500 [Urine:500] ?Intake/Output this shift: ?Total I/O ?In: 720 [P.O.:720] ?Out: -  ? ?General appearance: alert and no distress ?Resp: clear to auscultation bilaterally ?Cardio: regular rate and rhythm, S1, S2 normal, and systolic murmur: 2/6 ?GI: soft, non-tender; bowel sounds normal; no masses,  no organomegaly ?Neurologic: Mental status: Alert, oriented, thought content appropriate, alertness: alert, orientation: time, person, place, city, affect: flat ?Motor: grossly normal ? ?Lab Results: ?Recent Labs  ?  02/01/22 ?1447 02/01/22 ?1534 02/02/22 ?0400  ?WBC 5.9  --  5.5  ?HGB 8.8* 7.8* 7.9*  ?HCT 26.7* 23.0* 24.2*  ?PLT 173  --  165  ? ?BMET ?Recent Labs  ?  02/01/22 ?1440 02/01/22 ?1534 02/02/22 ?0400  ?NA 138 137 144  ?K 3.4* 3.8 3.8  ?CL 101 97* 106   ?CO2 31  --  32  ?GLUCOSE 125* 122* 96  ?BUN '19 20 11  '$ ?CREATININE 0.57 0.50 0.56  ?CALCIUM 8.4*  --  8.5*  ? ? ?Studies/Results: ?CT Head Wo Contrast ? ?Result Date: 02/01/2022 ?CLINICAL DATA:  Mental status changes of unknown cause EXAM: CT HEAD WITHOUT CONTRAST TECHNIQUE: Contiguous axial images were obtained from the base of the skull through the vertex without intravenous contrast. RADIATION DOSE REDUCTION: This exam was performed according to the departmental dose-optimization program which includes automated exposure control, adjustment of the mA and/or kV according to patient size and/or use of iterative reconstruction technique. COMPARISON:  06/02/2020 MRI.  05/30/2020 head CT. FINDINGS: Brain: Cerebral and cerebellar atrophy again identified. No mass lesion, hemorrhage, hydrocephalus, acute infarct, intra-axial, or extra-axial fluid collection. Vascular: No hyperdense vessel or unexpected calcification. Skull: Normal Sinuses/Orbits: Normal imaged portions of the orbits and globes. Clear paranasal sinuses and mastoid air cells. Other: None. IMPRESSION: No acute intracranial abnormality. Cerebral and cerebellar atrophy. Electronically Signed   By: Abigail Miyamoto M.D.   On: 02/01/2022 15:22  ? ?MR BRAIN WO CONTRAST ? ?Result Date: 02/01/2022 ?CLINICAL DATA:  Mental status change, unknown cause EXAM: MRI HEAD WITHOUT CONTRAST TECHNIQUE: Multiplanar, multiecho pulse sequences of the brain and surrounding structures were obtained without intravenous contrast. COMPARISON:  06/02/2020 MRI head, correlation is also made with 02/01/2022 CT head FINDINGS: Brain: No restricted diffusion to suggest acute or subacute infarct. No acute hemorrhage, mass, mass effect, or midline shift. Encephalomalacia in the right hippocampus/medial right temporal lobe, unchanged. No hydrocephalus or extra-axial collection. Vascular: Normal flow voids. Skull and upper cervical spine: Normal marrow signal. Sinuses/Orbits: No acute finding  Other: None. IMPRESSION: No acute intracranial process. No etiology seen for the patient's altered mental status. Electronically Signed   By: Merilyn Baba M.D.   On: 02/01/2022 19:14  ? ?DG Chest Portable 1 View ? ?Result Date: 02/01/2022 ?CLINICAL DATA:  Altered mental status. EXAM: PORTABLE CHEST 1 VIEW COMPARISON:  May 29, 2020. FINDINGS: The heart size and mediastinal contours are within normal limits. Both lungs are clear. The visualized skeletal structures are unremarkable. IMPRESSION: No active disease. Electronically Signed   By: Marijo Conception M.D.   On: 02/01/2022 15:24   ? ?Medications: I have reviewed the patient's current medications. ?Prior to Admission:  ?Medications Prior to Admission  ?Medication Sig Dispense Refill Last Dose  ? amantadine (SYMMETREL) 100 MG capsule Take 100 mg by mouth 2 (two) times daily. 930 & 2130   02/01/2022  ? ARIPiprazole (ABILIFY) 30 MG tablet Take 30 mg by mouth at bedtime. 2130   01/31/2022  ? aspirin EC 325 MG EC tablet Take 1 tablet (325 mg total) by mouth 2 (two) times daily for 20 days. Then take one 81 mg aspirin once a day for three weeks. Then discontinue aspirin. (Patient taking differently: Take 325 mg by mouth 2 (two) times daily.) 40 tablet 0 02/01/2022  ? cloBAZam (ONFI) 10 MG tablet Take 5-15 mg by mouth See admin instructions. Take 5 MG in the morning at 0930 and  (15 MG) in the evening at 2130   02/01/2022  ? escitalopram (LEXAPRO) 10 MG tablet Take 10 mg by mouth  at bedtime. 2130   01/31/2022  ? lacosamide (VIMPAT) 50 MG TABS tablet Take 50-150 mg by mouth See admin instructions. Pt takes '50mg'$  q am; '50mg'$  @ noon; then '150mg'$  at bedtime. BRAND NAME ONLY ?Marca Ancona Name *   02/01/2022  ? lamoTRIgine (LAMICTAL) 200 MG tablet Take 200-400 mg by mouth See admin instructions. '200mg'$  q am; '200mg'$  at noon; then '400mg'$  at bedtime.   02/01/2022  ? levETIRAcetam (KEPPRA) 1000 MG tablet Take 500-1,000 mg by mouth See admin instructions. '500mg'$  q am; '500mg'$  at noon; then '1000mg'$  qhs.    02/01/2022  ? Multiple Vitamins-Minerals (MULTIVITAMIN WITH MINERALS) tablet Take 1 tablet by mouth daily. 1230   01/31/2022  ? omeprazole (PRILOSEC) 40 MG capsule Take 40 mg by mouth daily. 0930   02/01/2022  ? methocarbamol (

## 2022-02-02 NOTE — Assessment & Plan Note (Addendum)
No clear etiology, patient remaining altered yesterday but improving to baseline by the afternoon  ?Observation/PCU with frequent neurochecks ?MRI brain 02/01/22 shows nothing to explain AMS. ?Continue IV fluids to transition to p.o. ?Avoid sedating medications. ? ?Neurology evaluation 02/02/22: "[recommendations]: ?- Delirium precautions to include limiting sedating medications ?- Seizure precautions ?- Continue home Vimpat, Lamictal, Onfi and Keppra? ?- Outpatient neurology follow up for cognitive testing to assess for possible incipient Lewy body dementia (sees Dr. Inocente Salles @ [facility]) ?- TIA work up to include CTA of head and neck, TTE and cardiac telemetry ?--> CTA 02/03/22: "No large vessel occlusion or proximal stenosis. No carotid bifurcation stenosis.  No carotid siphon stenosis. Mild atherosclerotic calcification of the aortic arch. ?--> TTE 02/03/22: EF 60-65%, no thrombosis or significant valvular disease  ?- EEG (ordered) ?- PT/OT/Speech ?--> PT recommending home health, TOC working on this  ?- Lamictal and Keppra levels (still pending as of 02/03/22) ?- Encourage oral hydration to resolve her xerostomia with associated mild dysarthria ?- Starting ASA 81 mg po daily ? ?Patient stable for discharge from medical standpoint but awaiting some of the above studies particularly EEG, awaiting recs from OT/SLT. Appreciate neuro recs re: dispo. Will keep on progressive unit for neuro checks unless neurology recommends deescalate  ? ? ?

## 2022-02-02 NOTE — Consult Note (Addendum)
Neurology Consultation ? ?Reason for Consult: AMS ?Referring Physician: Dr. Sheppard Coil ? ?CC: AMS ? ?History is obtained from:husband, daughter, patient and medical chart ? ?HPI: Andrea Crawford is a 66 y.o. female with past medical history of  copper deficiency, drug-induced Parkinsonism with tremor (but no gait dysfunction or muscle rigidity per husband), chronic normocytic anemia, OSA wears CPAP, history of complex partial epilepsy requiring multiple antiepileptic agents, osteoarthritis, osteopenia, pedal edema who underwent an uneventful left TKA on 01/28/22 but was brought to the emergency department by her husband on 02/01/22 due to a fall and altered mental status since 01/30/22 that has fluctuated with a trend towards progressive worsening over the past 2 days. Husband states on 4/7 (Friday AM) she had a 30 minute episode of slurred and garbled speech, was not making sense and was unable to answer questions coherently. She at that time also was unable to read a clock and per a video of the event supplied by her husband appears to have had receptive aphasia and mild right facial droop. Patient states she knew what she wanted to say but could not get her words out and could understand what husband was saying to her, although in the video she also appears to have a comprehension deficit, exhibits phonemic paraphasias, verbal perseveration and dysfluency. They tell me she has not had a seizure in over a year, and has not missed any doses of her anticonvulsant medications (clobazam, lamotrigine, levetiracetam and lacosamide). Husband is in control of medications. Daughter states that she was hospitalized 18 months ago and since then has been declining and there is concern for cognitive dysfunction. There is also concern for swallowing issues due to patient having issues with eating her meal today. They also tell me that this am she was confused and patient also recalls that she was confused. At this moment both husband  and daughter feel she is back at her baseline. Patient also endorses that she feels as if she is at her baseline. She denies any numbness, tingling or weakness.  ? ?She has had seizures for many years. [facility] evaluation; husband states that she was not a surgical candidate due to seizures "coming from both sides". She did have a spell similar to the current spell about 4 years ago that had resolved on its own.  ? ?LKW: 4/5 ?tpa given?: no, outside window  ?Premorbid modified Rankin scale (mRS): 4-Needs assistance to walk and tend to bodily needs ? ?ROS: Full ROS was performed and is negative except as noted in the HPI.   ?Past Medical History:  ?Diagnosis Date  ? Anemia 04/2020  ? Drug-induced Parkinson's disease ([place])   ? parkinson like  ? History of complex partial epilepsy   ? Murmur, cardiac   ? Osteoarthritis   ? Osteopenia   ? Pedal edema   ? Seizure disorder ([place])   ? Seizures ([place])   ? ? ?Essential (primary) hypertension  ? ?Family History  ?Problem Relation Age of Onset  ? Arthritis/Rheumatoid Mother   ? Melanoma Sister   ? Melanoma Brother   ? Lung cancer Brother   ? Breast cancer Paternal Aunt   ? Heart attack Father   ? Diabetes Sister   ? Seizures Neg Hx   ? ? ? ?Social History:  ? reports that she has never smoked. She has never used smokeless tobacco. She reports that she does not drink alcohol and  does not use drugs. ? ?Medications ? ?Current Facility-Administered Medications:  ?  acetaminophen (TYLENOL) tablet 650 mg, 650 mg, Oral, Q6H PRN **OR** acetaminophen (TYLENOL) suppository 650 mg, 650 mg, Rectal, Q6H PRN, Reubin Milan, MD ?  amantadine (SYMMETREL) capsule 100 mg, 100 mg, Oral, Q12H, Reubin Milan, MD, 100 mg at 02/02/22 1039 ?  ARIPiprazole (ABILIFY) tablet 30 mg, 30 mg, Oral, Q24H, Reubin Milan, MD, 30 mg at 02/01/22 2147 ?  cloBAZam (ONFI) tablet 5 mg, 5 mg, Oral, Daily, 5 mg at 02/02/22 1039  **AND** cloBAZam (ONFI) tablet 15 mg, 15 mg, Oral, QHS, Green, Terri L, RPH, 15 mg at 02/01/22 2148 ?  enoxaparin (LOVENOX) injection 40 mg, 40 mg, Subcutaneous, Q24H, Reubin Milan, MD, 40 mg at 02/01/22 2147 ?  escitalopram (LEXAPRO) tablet 10 mg, 10 mg, Oral, QHS, Reubin Milan, MD, 10 mg at 02/01/22 2149 ?  lacosamide (VIMPAT) tablet 50 mg, 50 mg, Oral, 2 times per day, 50 mg at 02/02/22 1503 **AND** lacosamide (VIMPAT) tablet 150 mg, 150 mg, Oral, QHS, Green, Terri L, RPH, 150 mg at 02/01/22 2148 ?  lactated ringers infusion, , Intravenous, Continuous, Reubin Milan, MD, Last Rate: 75 mL/hr at 02/02/22 1509, New Bag at 02/02/22 1509 ?  lamoTRIgine (LAMICTAL) tablet 200 mg, 200 mg, Oral, 2 times per day, 200 mg at 02/02/22 1503 **AND** lamoTRIgine (LAMICTAL) tablet 400 mg, 400 mg, Oral, QHS, Green, Terri L, RPH, 400 mg at 02/01/22 2149 ?  levETIRAcetam (KEPPRA) tablet 500 mg, 500 mg, Oral, 2 times per day, 500 mg at 02/02/22 1504 **AND** levETIRAcetam (KEPPRA) tablet 1,000 mg, 1,000 mg, Oral, QHS, Green, Terri L, RPH, 1,000 mg at 02/01/22 2148 ?  ondansetron (ZOFRAN) tablet 4 mg, 4 mg, Oral, Q6H PRN **OR** ondansetron (ZOFRAN) injection 4 mg, 4 mg, Intravenous, Q6H PRN, Reubin Milan, MD ?  pantoprazole (PROTONIX) EC tablet 40 mg, 40 mg, Oral, Daily, Reubin Milan, MD, 40 mg at 02/02/22 1037 ? ? ?Exam: ?Current vital signs: ?BP 98/60 (BP Location: Left Arm)   Pulse 78   Temp 98.6 ?F (37 ?C) (Oral)   Resp 19   Ht '5\' 2"'$  (1.575 m)   Wt 65 kg   SpO2 99%   BMI 26.21 kg/m?  ?Vital signs in last 24 hours: ?Temp:  [98 ?F (36.7 ?C)-98.7 ?F (37.1 ?C)] 98.6 ?F (37 ?C) (04/08 1243) ?Pulse Rate:  [68-86] 78 (04/08 1243) ?Resp:  [16-19] 19 (04/08 0601) ?BP: (94-115)/(53-89) 98/60 (04/08 1243) ?SpO2:  [98 %-100 %] 99 % (04/08 1243) ?Weight:  [65 kg] 65 kg (04/07 1804) ? ?GENERAL: Awake, alert in NAD ?HEENT: - Normocephalic and atraumatic, dry mm ?LUNGS - Clear to auscultation bilaterally  with no wheezes ?CV - S1S2 RRR, no m/r/g, equal pulses bilaterally. ?ABDOMEN - Soft, nontender, nondistended with normoactive BS ?Ext: Left knee swollen with ice pack on it, with LLE bruising. Limbs are warm, well perfused, with intact peripheral pulses, and other than at the operative site, with no edema ? ?NEURO:  ?Mental Status: AA&Ox5. Comprehension intact to all questions and commands. Speech fluent. There is mildl dysarthria in the context of xerostomia but husband states this is her baseline. Can name objects and repeat a phrase without error. No errors of grammar or syntax. No paraphasias or perseveration. Hypomimia and decreased prosody of speech is noted.  ?CN: Visual fields intact. Can count fingers and correctly tell the time from an analog wall clock. Pupils: equal and reactive. EOMI with saccadic  quality of visual pursuits noted. Face is mildly asymmetric at rest with subtly decreased NL fold on the left, but contracts equally when smiling. Vocalizations are mildly hypophonic.  ?Motor: Moves all 4 extremities 5/5 in all 4 with LLE strength testing to ankle strength only due to recent knee operation. Baseline essential tremor is noted in bilateral arms. No rest tremor is seen. Tone is normal and bulk is normal for age and level of conditioning. No pronator drift.  ?Sensation- Decreased on right side with NP exam but temp and FT are intact bilaterally on follow up attending exam with no extinction to DSS.  ?Coordination: FTN intact bilaterally ?Gait- Deferred ? ?NIHSS ?1a Level of Conscious.: 0 ?1b LOC Questions: 0 ?1c LOC Commands: 0 ?2 Best Gaze: 0 ?3 Visual: 0 ?4 Facial Palsy: 0 ?5a Motor Arm - left: 0 ?5b Motor Arm - Right: 0 ?6a Motor Leg - Left: 0 ?6b Motor Leg - Right: 0 ?7 Limb Ataxia: 0 ?8 Sensory: 1 >> 0 ?9 Best Language: 0 ?10 Dysarthria: 0 ?11 Extinct. and Inatten.: 0 ?TOTAL: 1 >> 0 ? ?Imaging ?I have reviewed the images obtained: ? ?CT-head 4/7: ?No acute intracranial abnormality. Cerebral  and cerebellar atrophy. ? ?MRI examination of the brain 4/7 ?No acute intracranial process. ? ?LABS: ?Ua negative  ?UDS +benzos ?Ethanol negative ?WBC 5.5 ?Ammonia 11 ?Lamotrigine level pending  ?Hgb 7.9 ? ?A

## 2022-02-02 NOTE — Assessment & Plan Note (Addendum)
Pt is postoperative knee replacement <1 week ago ?Monitor H&H. ?Transfuse as needed. ?

## 2022-02-02 NOTE — Assessment & Plan Note (Addendum)
See neurology recommendations as above ?

## 2022-02-02 NOTE — Assessment & Plan Note (Addendum)
Echo pending ?Likely d/t anemia but given possible TIA of course will w/u w TTE ?

## 2022-02-02 NOTE — Progress Notes (Signed)
RT has tried to put the CPAP on 3 times.The Pt was using the bathroom, she needed her medicine first and then a doctor was in the room. RT will continue to monitor ?

## 2022-02-02 NOTE — Assessment & Plan Note (Addendum)
ASA initially held, restarted per neuro recs ?Analgesics as needed. ?Lovenox for DVT prophylaxis. ?

## 2022-02-02 NOTE — Assessment & Plan Note (Addendum)
Replaced, WNL as of 02/03/22 ?magnesium level - no concerns ?

## 2022-02-03 ENCOUNTER — Inpatient Hospital Stay (HOSPITAL_COMMUNITY): Payer: Medicare Other

## 2022-02-03 DIAGNOSIS — G9340 Encephalopathy, unspecified: Secondary | ICD-10-CM | POA: Diagnosis not present

## 2022-02-03 DIAGNOSIS — R011 Cardiac murmur, unspecified: Secondary | ICD-10-CM

## 2022-02-03 DIAGNOSIS — R0609 Other forms of dyspnea: Secondary | ICD-10-CM

## 2022-02-03 LAB — ECHOCARDIOGRAM COMPLETE
AR max vel: 2.26 cm2
AV Area VTI: 2.24 cm2
AV Area mean vel: 2.15 cm2
AV Mean grad: 9 mmHg
AV Peak grad: 17 mmHg
Ao pk vel: 2.06 m/s
Area-P 1/2: 4.17 cm2
Height: 62 in
S' Lateral: 3 cm
Weight: 2292.8 [oz_av]

## 2022-02-03 LAB — COMPREHENSIVE METABOLIC PANEL WITH GFR
ALT: 15 U/L (ref 0–44)
AST: 17 U/L (ref 15–41)
Albumin: 2.9 g/dL — ABNORMAL LOW (ref 3.5–5.0)
Alkaline Phosphatase: 64 U/L (ref 38–126)
Anion gap: 6 (ref 5–15)
BUN: 14 mg/dL (ref 8–23)
CO2: 32 mmol/L (ref 22–32)
Calcium: 8.6 mg/dL — ABNORMAL LOW (ref 8.9–10.3)
Chloride: 103 mmol/L (ref 98–111)
Creatinine, Ser: 0.56 mg/dL (ref 0.44–1.00)
GFR, Estimated: >60 mL/min
Glucose, Bld: 107 mg/dL — ABNORMAL HIGH (ref 70–99)
Potassium: 3.7 mmol/L (ref 3.5–5.1)
Sodium: 141 mmol/L (ref 135–145)
Total Bilirubin: 0.6 mg/dL (ref 0.3–1.2)
Total Protein: 5.5 g/dL — ABNORMAL LOW (ref 6.5–8.1)

## 2022-02-03 LAB — CBC WITH DIFFERENTIAL/PLATELET
Abs Immature Granulocytes: 0.06 10*3/uL (ref 0.00–0.07)
Basophils Absolute: 0 10*3/uL (ref 0.0–0.1)
Basophils Relative: 0 %
Eosinophils Absolute: 0.1 10*3/uL (ref 0.0–0.5)
Eosinophils Relative: 2 %
HCT: 25.3 % — ABNORMAL LOW (ref 36.0–46.0)
Hemoglobin: 8.1 g/dL — ABNORMAL LOW (ref 12.0–15.0)
Immature Granulocytes: 1 %
Lymphocytes Relative: 29 %
Lymphs Abs: 1.9 10*3/uL (ref 0.7–4.0)
MCH: 30.9 pg (ref 26.0–34.0)
MCHC: 32 g/dL (ref 30.0–36.0)
MCV: 96.6 fL (ref 80.0–100.0)
Monocytes Absolute: 0.6 10*3/uL (ref 0.1–1.0)
Monocytes Relative: 10 %
Neutro Abs: 3.7 10*3/uL (ref 1.7–7.7)
Neutrophils Relative %: 58 %
Platelets: 189 10*3/uL (ref 150–400)
RBC: 2.62 MIL/uL — ABNORMAL LOW (ref 3.87–5.11)
RDW: 13.9 % (ref 11.5–15.5)
WBC: 6.4 10*3/uL (ref 4.0–10.5)
nRBC: 0.5 % — ABNORMAL HIGH (ref 0.0–0.2)

## 2022-02-03 MED ORDER — ASPIRIN EC 81 MG PO TBEC
81.0000 mg | DELAYED_RELEASE_TABLET | Freq: Every day | ORAL | Status: DC
Start: 2022-02-03 — End: 2022-02-05
  Administered 2022-02-03 – 2022-02-05 (×3): 81 mg via ORAL
  Filled 2022-02-03 (×3): qty 1

## 2022-02-03 MED ORDER — SODIUM CHLORIDE (PF) 0.9 % IJ SOLN
INTRAMUSCULAR | Status: AC
  Filled 2022-02-03: qty 50

## 2022-02-03 MED ORDER — IOHEXOL 350 MG/ML SOLN
75.0000 mL | Freq: Once | INTRAVENOUS | Status: AC | PRN
  Administered 2022-02-03: 100 mL via INTRAVENOUS

## 2022-02-03 NOTE — TOC Initial Note (Signed)
Transition of Care (TOC) - Initial/Assessment Note  ? ? ?Patient Details  ?Name: Andrea Crawford ?MRN: [id number] ?Date of Birth: [birth year] ? ?Transition of Care (TOC) CM/SW Contact:    ?Tawanna Cooler, RN ?Phone Number: ?02/03/2022, 9:46 AM ? ?Clinical Narrative:                 ? ?Transition of Care Department [facility]) has reviewed patient and no TOC needs have been identified at this time. We will continue to monitor patient advancement through interdisciplinary progression rounds. If new patient transition needs arise, please place a TOC consult. ? ? ?Expected Discharge Plan: Home/Self Care ?Barriers to Discharge: Continued Medical Work up ? ? ?Expected Discharge Plan and Services ?Expected Discharge Plan: Home/Self Care ?  ?   ?Living arrangements for the past 2 months: [place] ?                ? Prior Living Arrangements/Services ?Living arrangements for the past 2 months: [place] ?Lives with:: Spouse ?Patient language and need for interpreter reviewed:: Yes ?       ?Need for Family Participation in Patient Care: Yes (Comment) ?Care giver support system in place?: Yes (comment) ?  ?Criminal Activity/Legal Involvement Pertinent to Current Situation/Hospitalization: No - Comment as needed ? ?Activities of Daily Living ?Home Assistive Devices/Equipment: CPAP, Walker (specify type), Eyeglasses, Wheelchair, Shower chair with back, Bedside commode/3-in-1, Grab bars in shower (rolator, transfer chair) ?ADL Screening (condition at time of admission) ?Patient's cognitive ability adequate to safely complete daily activities?: No (having confusion today) ?Is the patient deaf or have difficulty hearing?: No ?Does the patient have difficulty seeing, even when wearing glasses/contacts?: No ?Does the patient have difficulty concentrating, remembering, or making decisions?: Yes (having trouble today) ?Patient able to express need for assistance with ADLs?: Yes ?Does the patient have difficulty dressing or  bathing?: Yes ?Independently performs ADLs?: No ?Communication: Independent ?Dressing (OT): Needs assistance ?Is this a change from baseline?: Pre-admission baseline ?Grooming: Needs assistance ?Is this a change from baseline?: Pre-admission baseline ?Feeding: Needs assistance ?Is this a change from baseline?: Pre-admission baseline ?Bathing: Needs assistance ?Is this a change from baseline?: Pre-admission baseline ?Toileting: Needs assistance ?Is this a change from baseline?: Pre-admission baseline ?In/Out Bed: Needs assistance ?Is this a change from baseline?: Pre-admission baseline ?Walks in Home: Needs assistance ?Is this a change from baseline?: Pre-admission baseline ?Does the patient have difficulty walking or climbing stairs?: Yes ?Weakness of Legs: Both ?Weakness of Arms/Hands: Both ? ? ?Emotional Assessment ?  ?Alcohol / Substance Use: Not Applicable ?Psych Involvement: No (comment) ? ?Admission diagnosis:  Acute encephalopathy [G93.40] ?Altered mental status, unspecified altered mental status type [R41.82] ?Altered mental state [R41.82] ?Patient Active Problem List  ? Diagnosis Date Noted  ? Altered mental state 02/02/2022  ? Heart murmur 02/02/2022  ? Acute encephalopathy 02/01/2022  ? Other seborrheic keratosis 02/01/2022  ? Normocytic anemia 02/01/2022  ? Hypokalemia 02/01/2022  ? S/P left total knee replacement 02/01/2022  ? Leukocytosis 02/01/2022  ? OA (osteoarthritis) of knee 01/28/2022  ? Osteoarthritis of left knee 01/28/2022  ? Psychotic disorder ([place]) 12/27/2021  ? Copper deficiency 06/28/2020  ? Other pancytopenia ([place]) 06/09/2020  ? Physical debility 06/09/2020  ? Pancytopenia, acquired ([place])   ? Pancytopenia ([place]) 05/31/2020  ? Schizophrenia ([place]) 05/31/2020  ? Volume overload 05/31/2020  ? Symptomatic anemia   ? Leucopenia 05/30/2020  ? Peripheral edema 05/30/2020  ? Epilepsy ([place]) 05/30/2020  ? Sleep apnea 05/30/2020  ? CHF (  congestive heart failure) ([place]) 05/30/2020  ? Anemia 05/29/2020   ? ?PCP:  Marda Stalker, PA-C ?Pharmacy:   ?[id number] [place] [address]?Phone: [phone]: [phone]? ? ?Readmission Risk Interventions ? ?  06/04/2020  ?  2:18 PM  ?Readmission Risk Prevention Plan  ?Transportation Screening Complete  ? ? ? ?

## 2022-02-03 NOTE — Progress Notes (Addendum)
? ? ?HISTORY AND PHYSICAL ? ?Patient: Andrea Crawford 66 y.o. female ?MRN: [id number] ? ?Today is hospital day 1 after presenting to ED on 02/01/2022  2:17 PM with  ?Chief Complaint  ?Patient presents with  ? Altered Mental Status  ? ?Andrea Crawford is a 66 y.o. female with medical history significant of copper deficiency, drug-induced Parkinson, chronic normocytic anemia, history of complex partial epilepsy requiring multiple antiepileptic agents, osteoarthritis, osteopenia, pedal edema who underwent an uneventful left TKA on 01/28/22 but was brought to the emergency department by her husband on 02/01/22 due to altered mental status since 01/30/22 that has become progressively worse over the past 2 days ? ?RECORD REVIEW AND HOSPITAL COURSE: ?Underwent total knee replacement 01/28/2022 and discharged 01/29/22, family reports starting 01/30/2022 patient had significantly altered mental status. Brought to ED 02/01/22. Continued to be altered without obvious cause into 02/02/22 - MRI brain WNL. Echo ordered. Neurology consult placed, recommended CTA Head/Neck, EEG, Echo (TTE), PT/OT/SLP, start ASA 81 mg, continue current antiseizure Rx.  ? ?Procedures and Significant Results:  ?none ? ?Consultants:  ?Neurology consulted 02/02/22   ? ? ? ?SUBJECTIVE today 02/03/22 :  ?Patient seen and examined resting comfortably in bed today, family is present at bedside (husband, 2 adult daughters).  Family has lots of questions about when her EEG will be performed, when will PT/OT evaluation occur, when will neurology be around to see her, etc.  But they do note that she seems much closer to her baseline today, less confusion, less slurred speech ? ? ? ? ?ASSESSMENT & PLAN ? ?Acute encephalopathy ?No clear etiology, patient remaining altered yesterday but improving to baseline by the afternoon  ?Observation/PCU with frequent neurochecks ?MRI brain 02/01/22 shows nothing to explain AMS. ?Continue IV fluids to transition to p.o. ?Avoid sedating  medications. ? ?Neurology evaluation 02/02/22: "[recommendations]: ?- Delirium precautions to include limiting sedating medications ?- Seizure precautions ?- Continue home Vimpat, Lamictal, Onfi and Keppra  ?- Outpatient neurology follow up for cognitive testing to assess for possible incipient Lewy body dementia (sees Dr. Inocente Salles @ [facility]) ?- TIA work up to include CTA of head and neck, TTE and cardiac telemetry ?--> CTA 02/03/22: "No large vessel occlusion or proximal stenosis. No carotid bifurcation stenosis.  No carotid siphon stenosis. Mild atherosclerotic calcification of the aortic arch. ?--> TTE 02/03/22: EF 60-65%, no thrombosis or significant valvular disease  ?- EEG (ordered) ?- PT/OT/Speech ?--> PT recommending home health, TOC working on this  ?- Lamictal and Keppra levels (still pending as of 02/03/22) ?- Encourage oral hydration to resolve her xerostomia with associated mild dysarthria ?- Starting ASA 81 mg po daily ? ?Patient stable for discharge from medical standpoint but awaiting some of the above studies particularly EEG, awaiting recs from OT/SLT. Appreciate neuro recs re: dispo. Will keep on progressive unit for neuro checks unless neurology recommends deescalate  ? ? ? ?Epilepsy ([place]) ?See neurology recommendations as above ? ?Normocytic anemia ?Pt is postoperative knee replacement <1 week ago ?Monitor H&H. ?Transfuse as needed. ? ?Hypokalemia ?Replaced, WNL as of 02/03/22 ?magnesium level - no concerns ? ?S/P left total knee replacement ?ASA initially held, restarted per neuro recs ?Analgesics as needed. ?Lovenox for DVT prophylaxis. ? ?Leukocytosis ?No fever or signs of infection. ?Resolved as of  ? ?Heart murmur ?Echo pending ?Likely d/t anemia but given possible TIA of course will w/u w TTE ? ?Altered mental state ?Consider BH consult if neuro w/u unrevealing  ? ?  ? ?VTE Ppx:  lovenox ?CODE STATUS: FULL ?Admitted from: home ?Expected Dispo: TBD ?Barriers to discharge: neuro recs, lamictal level, Echo,  improvement in mentation. May need to consider [facility] consult if neurology w/u nonrevealing.  ?Family communication: husband and daughters at bedside on medical floor  ? ? ? ? ? ? ? ? ? ? ? ? ? ?Objective: ?Vital signs in last 24 hours: ?Temp:  [97.5 ?F (36.4 ?C)-98.9 ?F (37.2 ?C)] 97.5 ?F (36.4 ?C) (04/09 0449) ?Pulse Rate:  [74-78] 74 (04/09 0449) ?Resp:  [18-20] 18 (04/09 0449) ?BP: (97-101)/(52-60) 101/55 (04/09 0449) ?SpO2:  [94 %-99 %] 97 % (04/09 0449) ?Weight change:  ?Last BM Date : 02/02/22 ? ?Intake/Output from previous day: ?04/08 0701 - 04/09 0700 ?In: 2973.4 [P.O.:1120; I.V.:1853.4] ?Out: 800 [Urine:800] ?Intake/Output this shift: ?No intake/output data recorded. ? ?General appearance: alert and no distress ?Resp: clear to auscultation bilaterally ?Cardio: regular rate and rhythm, S1, S2 normal, and systolic murmur: 2/6 ?GI: soft, non-tender; bowel sounds normal; no masses,  no organomegaly ?Neurologic: Mental status: Alert, oriented, thought content appropriate, alertness: alert, orientation: time, person, place, city, affect: normal ?Motor: grossly normal ? ?Lab Results: ?Recent Labs  ?  02/02/22 ?0400 02/03/22 ?0328  ?WBC 5.5 6.4  ?HGB 7.9* 8.1*  ?HCT 24.2* 25.3*  ?PLT 165 189  ? ? ?BMET ?Recent Labs  ?  02/02/22 ?0400 02/03/22 ?0328  ?NA 144 141  ?K 3.8 3.7  ?CL 106 103  ?CO2 32 32  ?GLUCOSE 96 107*  ?BUN 11 14  ?CREATININE 0.56 0.56  ?CALCIUM 8.5* 8.6*  ? ? ? ?Studies/Results: ?CT Head Wo Contrast ? ?Result Date: 02/01/2022 ?CLINICAL DATA:  Mental status changes of unknown cause EXAM: CT HEAD WITHOUT CONTRAST TECHNIQUE: Contiguous axial images were obtained from the base of the skull through the vertex without intravenous contrast. RADIATION DOSE REDUCTION: This exam was performed according to the departmental dose-optimization program which includes automated exposure control, adjustment of the mA and/or kV according to patient size and/or use of iterative reconstruction technique. COMPARISON:   06/02/2020 MRI.  05/30/2020 head CT. FINDINGS: Brain: Cerebral and cerebellar atrophy again identified. No mass lesion, hemorrhage, hydrocephalus, acute infarct, intra-axial, or extra-axial fluid collection. Vascular: No hyperdense vessel or unexpected calcification. Skull: Normal Sinuses/Orbits: Normal imaged portions of the orbits and globes. Clear paranasal sinuses and mastoid air cells. Other: None. IMPRESSION: No acute intracranial abnormality. Cerebral and cerebellar atrophy. Electronically Signed   By: Abigail Miyamoto M.D.   On: 02/01/2022 15:22  ? ?MR BRAIN WO CONTRAST ? ?Result Date: 02/01/2022 ?CLINICAL DATA:  Mental status change, unknown cause EXAM: MRI HEAD WITHOUT CONTRAST TECHNIQUE: Multiplanar, multiecho pulse sequences of the brain and surrounding structures were obtained without intravenous contrast. COMPARISON:  06/02/2020 MRI head, correlation is also made with 02/01/2022 CT head FINDINGS: Brain: No restricted diffusion to suggest acute or subacute infarct. No acute hemorrhage, mass, mass effect, or midline shift. Encephalomalacia in the right hippocampus/medial right temporal lobe, unchanged. No hydrocephalus or extra-axial collection. Vascular: Normal flow voids. Skull and upper cervical spine: Normal marrow signal. Sinuses/Orbits: No acute finding Other: None. IMPRESSION: No acute intracranial process. No etiology seen for the patient's altered mental status. Electronically Signed   By: Merilyn Baba M.D.   On: 02/01/2022 19:14  ? ?DG Chest Portable 1 View ? ?Result Date: 02/01/2022 ?CLINICAL DATA:  Altered mental status. EXAM: PORTABLE CHEST 1 VIEW COMPARISON:  May 29, 2020. FINDINGS: The heart size and mediastinal contours are within normal limits. Both lungs are clear. The visualized  skeletal structures are unremarkable. IMPRESSION: No active disease. Electronically Signed   By: Marijo Conception M.D.   On: 02/01/2022 15:24   ? ?Medications: I have reviewed the patient's current  medications. ?Prior to Admission:  ?Medications Prior to Admission  ?Medication Sig Dispense Refill Last Dose  ? amantadine (SYMMETREL) 100 MG capsule Take 100 mg by mouth 2 (two) times daily. 930 & 2130   02/01/2022  ? ARIPiprazole (A

## 2022-02-03 NOTE — Evaluation (Signed)
Occupational Therapy Evaluation ?Patient Details ?Name: Andrea Crawford ?MRN: [id number] ?DOB: 07-20-1956 ?Today's Date: 02/03/2022 ? ? ?History of Present Illness Pt is 66 yo female admitted 02/01/22 with altered mental status, acute encephalopathy - no clear etiology. Pt had L TKA on 01/28/22.  Pt with hx of copper deficiency, drug-induced Parkinson tremor, chronic normocytic anemia, complex partial epilepsy, OA, osteopenia, and pedal edema.  ? ?Clinical Impression ?  ?Andrea Crawford is a 66 year old woman admitted with above medical history. ON evaluation she requires min guard for ambulation with walker and supervision for ADLs. She has a history of cognitive impairment and requires verbal cues for safety and proper technique for transfers and ambulation. Her family reports falls prior to knee surgery and 3 falls since knee surgery. She has a history of impairments with visual depth perception and visual spatial processing. She needs 24/7 supervision, consistent cueing in order for her to learn and retain new safety recommendations with ambulation and ADLs, and a safer home setup. Recommend HH OT at discharge.  ?   ? ?Recommendations for follow up therapy are one component of a multi-disciplinary discharge planning process, led by the attending physician.  Recommendations may be updated based on patient status, additional functional criteria and insurance authorization.  ? ?Follow Up Recommendations ? Home health OT  ?  ?Assistance Recommended at Discharge Set up Supervision/Assistance  ?Patient can return home with the following Assistance with cooking/housework;Direct supervision/assist for financial management;Direct supervision/assist for medications management;Help with stairs or ramp for entrance ? ?  ?Functional Status Assessment ? Patient has had a recent decline in their functional status and demonstrates the ability to make significant improvements in function in a reasonable and predictable amount of  time.  ?Equipment Recommendations ? None recommended by OT  ?  ?Recommendations for Other Services   ? ? ?  ?Precautions / Restrictions Precautions ?Precautions: Fall ?Restrictions ?Weight Bearing Restrictions: No ?LLE Weight Bearing: Weight bearing as tolerated  ? ?  ? ?Mobility Bed Mobility ?  ?  ?  ?  ?  ?  ?  ?  ?  ? ?Transfers ?  ?  ?  ?  ?  ?  ?  ?  ?  ?  ?  ? ?  ?Balance Overall balance assessment: History of Falls ?  ?  ?  ?  ?  ?  ?  ?  ?  ?  ?  ?  ?  ?  ?  ?  ?  ?  ?   ? ?ADL either performed or assessed with clinical judgement  ? ?ADL Overall ADL's : Needs assistance/impaired ?Eating/Feeding: Independent ?  ?Grooming: Supervision/safety ?  ?Upper Body Bathing: Supervision/ safety ?  ?Lower Body Bathing: Supervison/ safety ?  ?Upper Body Dressing : Supervision/safety ?  ?Lower Body Dressing: Supervision/safety ?  ?Toilet Transfer: Supervision/safety ?  ?Toileting- Clothing Manipulation and Hygiene: Supervision/safety ?  ?  ?  ?Functional mobility during ADLs: Min guard;Rolling walker (2 wheels) ?General ADL Comments: Able to perform ADLs with supervision. Needs cues for safety, proper use of rw, use of grab bars.  ? ? ? ?Vision Patient Visual Report: No change from baseline ?Additional Comments: Hx of impaired depth perception, visual spatial awarenes. Reported double vision with onset of current problems but now resolved.  ?   ?Perception   ?  ?Praxis   ?  ? ?Pertinent Vitals/Pain Pain Assessment ?Pain Assessment: No/denies pain  ? ? ? ?Hand Dominance   ?  ?  Extremity/Trunk Assessment Upper Extremity Assessment ?Upper Extremity Assessment: RUE deficits/detail;LUE deficits/detail ?RUE Deficits / Details: WFL ROM, 5/5 strength ?RUE Sensation: WNL ?RUE Coordination: WNL ?LUE Deficits / Details: WFL ROM, 5/5 strength ?LUE Sensation: WNL ?LUE Coordination: WNL ?  ?Lower Extremity Assessment ?Lower Extremity Assessment: Defer to PT evaluation ?RLE Deficits / Details: ROM WFL; MMT 5/5 ?LLE Deficits / Details:  Recnet TKA, significant edema and ecchymosis; ROM: ankle and hip WFL, knee 15 to 90 degrees; MMT: ankle 5/5, knee and hip 3/5 ?  ?Cervical / Trunk Assessment ?Cervical / Trunk Assessment: Normal ?  ?Communication Communication ?Communication: Expressive difficulties (quiet speech, difficult to understand at times) ?  ?Cognition Arousal/Alertness: Awake/alert ?Behavior During Therapy: [facility] for tasks assessed/performed ?Overall Cognitive Status: History of cognitive impairments - at baseline ?  ?  ?  ?  ?  ?  ?  ?  ?  ?  ?  ?  ?  ?  ?  ?  ?General Comments: Impaired cogntion at baseline though grossly alert and oriented. Appears to have deficits in regards to insight, problem solving, safety awareness. ?  ?  ?General Comments    ? ?  ?Exercises   ?  ?Shoulder Instructions    ? ? ?Home Living Family/patient expects to be discharged to:: Private residence ?Living Arrangements: Spouse/significant other ?Available Help at Discharge: Family;Available 24 hours/day ?Type of Home: House ?Home Access: Stairs to enter ?Entrance Stairs-Number of Steps: 2 ?Entrance Stairs-Rails: None (steps wide enough to fit RW) ?Home Layout: Two level;Able to live on main level with bedroom/bathroom;1/2 bath on main level (Pt's bed and bath upstairs but can stay in temporary room downstairs) ?Alternate Level Stairs-Number of Steps: 11 ?Alternate Level Stairs-Rails: Right ?Bathroom Shower/Tub: Walk-in shower ?  ?Bathroom Toilet: Handicapped height ?Bathroom Accessibility: Yes ?  ?Home Equipment: Conservation officer, nature (2 wheels);Rollator (4 wheels);Cane - single point;Tub bench ?  ?Additional Comments: Daughter and spouse report that pt independent prior to hospitalization 18 months ago and since that time more difficulty with ambulation and some cognitive deficits (difficult with spatial awareness, planning, safety, etc per daughter). ?  ? ?  ?Prior Functioning/Environment Prior Level of Function : History of Falls (last six months);Independent/Modified  Independent ?  ?  ?  ?  ?  ?  ?Mobility Comments: Prior to TKA pt used SPC for mobility; using RW since surgery last week but limited mobility and 3 falls ?ADLs Comments: Husband assist with dressing; increased assist with all ADLs since TKA last week ?  ? ?  ?  ?OT Problem List: Decreased cognition;Decreased safety awareness;Decreased knowledge of use of DME or AE;Impaired balance (sitting and/or standing) ?  ?   ?OT Treatment/Interventions:    ?  ?OT Goals(Current goals can be found in the care plan section) Acute Rehab OT Goals ?OT Goal Formulation: All assessment and education complete, DC therapy  ?OT Frequency:   ?  ? ?Co-evaluation   ?  ?  ?  ?  ? ?  ?AM-PAC OT "6 Clicks" Daily Activity     ?Outcome Measure Help from another person eating meals?: None ?Help from another person taking care of personal grooming?: A Little ?Help from another person toileting, which includes using toliet, bedpan, or urinal?: A Little ?Help from another person bathing (including washing, rinsing, drying)?: A Little ?Help from another person to put on and taking off regular upper body clothing?: A Little ?Help from another person to put on and taking off regular lower body clothing?: A Little ?6  Click Score: 19 ?  ?End of Session Equipment Utilized During Treatment: Gait belt;Rolling walker (2 wheels) ? ?Activity Tolerance: Patient tolerated treatment well ?Patient left: in chair;with call bell/phone within reach;with family/visitor present ? ?OT Visit Diagnosis: Other abnormalities of gait and mobility (R26.89);Other symptoms and signs involving cognitive function ?Symptoms and signs involving cognitive functions: Other cerebrovascular disease  ?              ?Time: 8295-6213 ?OT Time Calculation (min): 35 min ?Charges:  OT General Charges ?$OT Visit: 1 Visit ?OT Evaluation ?$OT Eval Moderate Complexity: 1 Mod ? ?Lennell Shanks, OTR/L ?Acute [facility] Re[facility]  ?Office [phone] ?Pager: [phone]  ? ?Andrea Crawford ?02/03/2022, 3:57  PM ?

## 2022-02-03 NOTE — Progress Notes (Signed)
?  Echocardiogram ?2D Echocardiogram has been performed. ? ?Andrea Crawford ?02/03/2022, 2:23 PM ?

## 2022-02-03 NOTE — Evaluation (Addendum)
Physical Therapy Evaluation ?Patient Details ?Name: Andrea Crawford ?MRN: [id number] ?DOB: Feb 26, 1956 ?Today's Date: 02/03/2022 ? ?History of Present Illness ? Pt is 66 yo female admitted 02/01/22 with altered mental status, acute encephalopathy - no clear etiology. Pt had L TKA on 01/28/22.  Pt with hx of copper deficiency, drug-induced Parkinson tremor, chronic normocytic anemia, complex partial epilepsy, OA, osteopenia, and pedal edema.  ?Clinical Impression ? Pt admitted with above diagnosis. At baseline, pt using cane and has some mild cognitive deficits per family (difficulty with motor planning, spatial awareness, etc).  She recently (last week) had a L TKA.  After going home, pt with increased confusion and difficulty with mobility.  Today, she is feeling better and feels cognition returned to baseline. She was able to ambulate 75' with min A (reports most she has moved since going home) and had good participation with exercises.  Does need at least mod cues for exercise technique and mobility safety.  Spouse able to provide 24 hr support. Family expressing interest in [place] at d/c - pt has had 3 falls in past week and does have difficulty with mobility - could benefit from [place].  Pt currently with functional limitations due to the deficits listed below (see PT Problem List). Pt will benefit from skilled PT to increase their independence and safety with mobility to allow discharge to the venue listed below.   ?   ?   ? ?Recommendations for follow up therapy are one component of a multi-disciplinary discharge planning process, led by the attending physician.  Recommendations may be updated based on patient status, additional functional criteria and insurance authorization. ? ?Follow Up Recommendations Home health PT ? ?  ?Assistance Recommended at Discharge Frequent or constant Supervision/Assistance (advised hands on mobility)  ?Patient can return home with the following ? A little help with walking and/or  transfers;A little help with bathing/dressing/bathroom;Assistance with cooking/housework;Help with stairs or ramp for entrance;Assist for transportation ? ?  ?Equipment Recommendations None recommended by PT  ?Recommendations for Other Services ?    ?  ?Functional Status Assessment Patient has had a recent decline in their functional status and demonstrates the ability to make significant improvements in function in a reasonable and predictable amount of time.  ? ?  ?Precautions / Restrictions Precautions ?Precautions: Fall ?Restrictions ?LLE Weight Bearing: Weight bearing as tolerated  ? ?  ? ?Mobility ? Bed Mobility ?Overal bed mobility: Needs Assistance ?Bed Mobility: Supine to Sit ?  ?  ?Supine to sit: Min assist ?  ?  ?General bed mobility comments: light min A to steady ?  ? ?Transfers ?Overall transfer level: Needs assistance ?Equipment used: Rolling walker (2 wheels) ?Transfers: Sit to/from Stand ?Sit to Stand: Min guard ?  ?  ?  ?  ?  ?General transfer comment: From bed and toielt; required cues for hand placement and cues for safety (in bathroom started standing on her own by pulling on RW and was going backwards) ?  ? ?Ambulation/Gait ?Ambulation/Gait assistance: Min assist ?Gait Distance (Feet): 80 Feet ?Assistive device: Rolling walker (2 wheels) ?Gait Pattern/deviations: Step-to pattern, Decreased stride length, Decreased weight shift to left, Trunk flexed ?Gait velocity: decreased ?  ?  ?General Gait Details: DIfficulty with terminal knee extension; drifts to the R requiring cues and assist to correct ? ?Stairs ?  ?  ?  ?  ?  ? ?Wheelchair Mobility ?  ? ?Modified Rankin (Stroke Patients Only) ?  ? ?  ? ?Balance Overall balance assessment: Needs  assistance ?Sitting-balance support: Feet supported, No upper extremity supported ?Sitting balance-Leahy Scale: Fair ?  ?  ?Standing balance support: Reliant on assistive device for balance, Bilateral upper extremity supported ?Standing balance-Leahy Scale:  Poor ?  ?  ?  ?  ?  ?  ?  ?  ?  ?  ?  ?  ?   ? ? ? ?Pertinent Vitals/Pain Pain Assessment ?Pain Assessment: No/denies pain  ? ? ?Home Living Family/patient expects to be discharged to:: Private residence ?Living Arrangements: Spouse/significant other ?Available Help at Discharge: Family;Available 24 hours/day ?Type of Home: House ?Home Access: Stairs to enter ?Entrance Stairs-Rails: None (steps wide enough to fit RW) ?Entrance Stairs-Number of Steps: 2 ?Alternate Level Stairs-Number of Steps: 11 ?Home Layout: Two level;Able to live on main level with bedroom/bathroom;1/2 bath on main level (Pt's bed and bath upstairs but can stay in temporary room downstairs) ?Home Equipment: Conservation officer, nature (2 wheels);Rollator (4 wheels);Cane - single point;Tub bench ?Additional Comments: Daughter and spouse report that pt independent prior to hospitalization 18 months ago and since that time more difficulty with ambulation and some cognitive deficits (difficult with spatial awareness, planning, safety, etc per daughter).  ?  ?Prior Function Prior Level of Function : History of Falls (last six months);Independent/Modified Independent ?  ?  ?  ?  ?  ?  ?Mobility Comments: Prior to TKA pt used SPC for mobility; using RW since surgery last week but limited mobility and 3 falls ?ADLs Comments: Husband assist with dressing; increased assist with all ADLs since TKA last week ?  ? ? ?Hand Dominance  ?   ? ?  ?Extremity/Trunk Assessment  ? Upper Extremity Assessment ?Upper Extremity Assessment: Defer to OT evaluation ?  ? ?Lower Extremity Assessment ?Lower Extremity Assessment: LLE deficits/detail;RLE deficits/detail ?RLE Deficits / Details: ROM WFL; MMT 5/5 ?LLE Deficits / Details: Recnet TKA, significant edema and ecchymosis; ROM: ankle and hip WFL, knee 15 to 90 degrees; MMT: ankle 5/5, knee and hip 3/5 ?  ? ?Cervical / Trunk Assessment ?Cervical / Trunk Assessment: Normal  ?Communication  ? Communication: Expressive difficulties (quiet  speech, difficult to understand at times)  ?Cognition Arousal/Alertness: Awake/alert ?Behavior During Therapy: [facility] for tasks assessed/performed ?Overall Cognitive Status: History of cognitive impairments - at baseline ?  ?  ?  ?  ?  ?  ?  ?  ?  ?  ?  ?  ?  ?  ?  ?  ?General Comments: Pt admitted with AMS but reports feels back to her baseline.  Family agrees.  Family reports some deficits at baseline (see prior comment) ?  ?  ? ?  ?General Comments   ? ?  ?Exercises Total Joint Exercises ?Ankle Circles/Pumps: AROM, Both, 10 reps, Supine ?Quad Sets: AROM, Both, 10 reps, Supine (max cues ; towel roll under L ankle) ?Heel Slides: AROM, Left, 10 reps, Supine ?Hip ABduction/ADduction: Left, 10 reps, AAROM, Supine (cues for toes up (limited ER hip)) ?Long Arc Quad: AROM, Left, 10 reps, Seated (cues to focus on knee) ?Knee Flexion: AROM, Left, 10 reps, Seated ?Goniometric ROM: L knee 15 to 90 degrees ?Other Exercises ?Other Exercises: Encouarged/educated on importance of mobility, exercises, but particularly of working toward full knee ext.  Encouraged quad sets with towel roll and low load long duration extension stretching (ex: double towel roll, foot propt on chair , for 10-20 mins at a time).  Educated on safe ice use  ? ?Assessment/Plan  ?  ?PT Assessment Patient needs continued  PT services  ?PT Problem List Decreased strength;Decreased range of motion;Decreased activity tolerance;Decreased balance;Decreased mobility;Decreased coordination;Decreased knowledge of use of DME;Decreased safety awareness;Decreased knowledge of precautions;Pain;Decreased cognition ? ?   ?  ?PT Treatment Interventions DME instruction;Gait training;Stair training;Functional mobility training;Therapeutic activities;Therapeutic exercise;Balance training;Neuromuscular re-education;Patient/family education;Modalities   ? ?PT Goals (Current goals can be found in the Care Plan section)  ?Acute Rehab PT Goals ?Patient Stated Goal: return home ?PT  Goal Formulation: With patient/family ?Time For Goal Achievement: 02/17/22 ?Potential to Achieve Goals: Good ? ?  ?Frequency 4X/week ?  ? ? ?Co-evaluation   ?  ?  ?  ?  ? ? ?  ?AM-PAC PT "6 Clicks" Mobility  ?Outc

## 2022-02-03 NOTE — TOC Progression Note (Signed)
Transition of Care (TOC) - Progression Note  ? ? ?Patient Details  ?Name: Andrea Crawford ?MRN: [id number] ?Date of Birth: 1956-06-09 ? ?Transition of Care (TOC) CM/SW Contact  ?Tawanna Cooler, RN ?Phone Number: ?02/03/2022, 2:40 PM ? ?Clinical Narrative:    ? ?Received message from PT that they recommend home health for patient.  Called patient's husband, Eddie Dibbles, to ask for [facility] choice.  He states the patient was on service with [facility] in the past, and they'd like to use [facility] again.  Notified [facility] rep Tommi Rumps that they are choice.  ?

## 2022-02-04 ENCOUNTER — Inpatient Hospital Stay (HOSPITAL_COMMUNITY)
Admission: EM | Admit: 2022-02-04 | Discharge: 2022-02-04 | Disposition: A | Discharge status: Home / Self Care | Payer: Medicare Other | Source: Home / Self Care | Attending: Osteopathic Medicine | Admitting: Osteopathic Medicine

## 2022-02-04 DIAGNOSIS — G40909 Epilepsy, unspecified, not intractable, without status epilepticus: Secondary | ICD-10-CM | POA: Diagnosis not present

## 2022-02-04 DIAGNOSIS — G9340 Encephalopathy, unspecified: Secondary | ICD-10-CM | POA: Diagnosis not present

## 2022-02-04 LAB — LAMOTRIGINE LEVEL
Lamotrigine Lvl: 18.1 ug/mL (ref 2.0–20.0)
Lamotrigine Lvl: 13.3 ug/mL (ref 2.0–20.0)

## 2022-02-04 LAB — LEVETIRACETAM LEVEL: Levetiracetam Lvl: 27 ug/mL (ref 10.0–40.0)

## 2022-02-04 MED ORDER — BUPIVACAINE IN DEXTROSE 0.75-8.25 % IT SOLN
INTRATHECAL | Status: DC | PRN
  Administered 2022-01-28: 12 mg via INTRATHECAL

## 2022-02-04 NOTE — Progress Notes (Signed)
Physical Therapy Treatment ?Patient Details ?Name: Andrea Crawford ?MRN: [id number] ?DOB: 1956-04-05 ?Today's Date: 02/04/2022 ? ? ?History of Present Illness Pt is 66 yo female admitted 02/01/22 with altered mental status, acute encephalopathy - no clear etiology. Pt had L TKA on 01/28/22.  Pt with hx of copper deficiency, drug-induced Parkinson tremor, chronic normocytic anemia, complex partial epilepsy, OA, osteopenia, and pedal edema. ? ?  ?PT Comments  ? ? Pt able to increase ambulation distance today.  She does veer to the R and needs cues for posture.  When cued for gait refinement, then her ability to keep RW on a smooth path declined.  No reports of pain with gait or therex.  Recommend HHPT. ?   ?Recommendations for follow up therapy are one component of a multi-disciplinary discharge planning process, led by the attending physician.  Recommendations may be updated based on patient status, additional functional criteria and insurance authorization. ? ?Follow Up Recommendations ? Home health PT ?  ?  ?Assistance Recommended at Discharge Frequent or constant Supervision/Assistance  ?Patient can return home with the following A little help with walking and/or transfers;A little help with bathing/dressing/bathroom;Assistance with cooking/housework;Help with stairs or ramp for entrance;Assist for transportation ?  ?Equipment Recommendations ? None recommended by PT  ?  ?Recommendations for Other Services   ? ? ?  ?Precautions / Restrictions Precautions ?Precautions: Fall ?Restrictions ?Weight Bearing Restrictions: Yes ?LLE Weight Bearing: Weight bearing as tolerated  ?  ? ?Mobility ? Bed Mobility ?  ?  ?  ?  ?  ?  ?  ?General bed mobility comments: in recliner upon arrival ?  ? ?Transfers ?Overall transfer level: Needs assistance ?Equipment used: Rolling walker (2 wheels) ?Transfers: Sit to/from Stand ?Sit to Stand: Min guard ?  ?  ?  ?  ?  ?General transfer comment: cues for leg and hand placement ?   ? ?Ambulation/Gait ?Ambulation/Gait assistance: Min guard, Min assist ?Gait Distance (Feet): 300 Feet ?Assistive device: Rolling walker (2 wheels) ?Gait Pattern/deviations: Decreased step length - right, Decreased step length - left, Drifts right/left, Decreased dorsiflexion - left ?Gait velocity: decreased ?  ?  ?General Gait Details: Drifts to the R and needed verbal and tactile cues at time for obstacle negotiation.  Worked on heel to toe gait, but then that affected her flow with the RW. ? ? ?Stairs ?  ?  ?  ?  ?  ? ? ?Wheelchair Mobility ?  ? ?Modified Rankin (Stroke Patients Only) ?  ? ? ?  ?Balance   ?  ?  ?  ?  ?  ?  ?  ?  ?  ?  ?  ?  ?  ?  ?  ?  ?  ?  ?  ? ?  ?Cognition Arousal/Alertness: Awake/alert ?Behavior During Therapy: [facility] for tasks assessed/performed ?Overall Cognitive Status: History of cognitive impairments - at baseline ?  ?  ?  ?  ?  ?  ?  ?  ?  ?  ?  ?  ?  ?  ?  ?  ?General Comments: Can follow instructions, but decreased problem solving and insight. ?  ?  ? ?  ?Exercises Total Joint Exercises ?Ankle Circles/Pumps: AROM, Both, 10 reps ?Quad Sets: AROM, Left, 10 reps ?Heel Slides: AROM, Left, 10 reps ?Hip ABduction/ADduction: AROM, Left, 10 reps ?Long Arc Quad: AROM, Left, 10 reps, Other (comment) (cues to slow down and get full range) ?Knee Flexion: AROM, Left, 10 reps, Seated ? ?  ?  General Comments   ?  ?  ? ?Pertinent Vitals/Pain Pain Assessment ?Pain Assessment: No/denies pain  ? ? ?Home Living   ?  ?  ?  ?  ?  ?  ?  ?  ?  ?   ?  ?Prior Function    ?  ?  ?   ? ?PT Goals (current goals can now be found in the care plan section) Acute Rehab PT Goals ?Patient Stated Goal: return home ?PT Goal Formulation: With patient/family ?Time For Goal Achievement: 02/17/22 ?Potential to Achieve Goals: Good ?Progress towards PT goals: Progressing toward goals ? ?  ?Frequency ? ? ? Min 4X/week ? ? ? ?  ?PT Plan Current plan remains appropriate  ? ? ?Co-evaluation   ?  ?  ?  ?  ? ?  ?AM-PAC PT "6 Clicks"  Mobility   ?Outcome Measure ? Help needed turning from your back to your side while in a flat bed without using bedrails?: A Little ?Help needed moving from lying on your back to sitting on the side of a flat bed without using bedrails?: A Little ?Help needed moving to and from a bed to a chair (including a wheelchair)?: A Little ?Help needed standing up from a chair using your arms (e.g., wheelchair or bedside chair)?: A Little ?Help needed to walk in hospital room?: A Little ?Help needed climbing 3-5 steps with a railing? : A Little ?6 Click Score: 18 ? ?  ?End of Session Equipment Utilized During Treatment: Gait belt ?Activity Tolerance: Patient tolerated treatment well ?Patient left: in chair;with call bell/phone within reach;with family/visitor present ?Nurse Communication: Mobility status ?PT Visit Diagnosis: Difficulty in walking, not elsewhere classified (R26.2) ?  ? ? ?Time: 0076-2263 ?PT Time Calculation (min) (ACUTE ONLY): 25 min ? ?Charges:  $Gait Training: 8-22 mins ?$Therapeutic Exercise: 8-22 mins          ?          ? ?Santiago Glad L. Tamala Julian, PT ? ?02/04/2022 ? ? ? ?Galen Manila ?02/04/2022, 2:29 PM ? ?

## 2022-02-04 NOTE — Progress Notes (Signed)
EEG complete - results pending 

## 2022-02-04 NOTE — Progress Notes (Signed)
? ? ?HISTORY AND PHYSICAL ? ?Patient: Andrea Crawford 66 y.o. female ?MRN: [id number] ? ?Today is hospital day 2 after presenting to ED on 02/01/2022  2:17 PM with  ?Chief Complaint  ?Patient presents with  ? Altered Mental Status  ? ?Andrea Crawford is a 66 y.o. female with medical history significant of copper deficiency, drug-induced Parkinson, chronic normocytic anemia, history of complex partial epilepsy requiring multiple antiepileptic agents, osteoarthritis, osteopenia, pedal edema who underwent an uneventful left TKA on 01/28/22 but was brought to the emergency department by her husband on 02/01/22 due to altered mental status since 01/30/22 that has become progressively worse over the past 2 days ? ?RECORD REVIEW AND HOSPITAL COURSE: ?Underwent total knee replacement 01/28/2022 and discharged 01/29/22, family reports starting 01/30/2022 patient had significantly altered mental status. Brought to ED 02/01/22. Continued to be altered without obvious cause into 02/02/22 - MRI brain WNL. Echo ordered. Neurology consult placed, recommended CTA Head/Neck, EEG, Echo (TTE), PT/OT/SLP, start ASA 81 mg, continue current antiseizure Rx.  ? ?Procedures and Significant Results:  ?none ? ?Consultants:  ?Neurology consulted 02/02/22   ? ? ? ?SUBJECTIVE today 02/04/22 :  ?Patient seen alongside patient's husband, daughter and nurse. ?Patient is currently undergoing EEG. ?No new complaints today. ?Suspect findings may be secondary to cognitive decline (dementing illness) ? ? ? ?ASSESSMENT & PLAN ? ?Acute encephalopathy ?No clear etiology, patient remaining altered yesterday but improving to baseline by the afternoon  ?Observation/PCU with frequent neurochecks ?MRI brain 02/01/22 shows nothing to explain AMS. ?Continue IV fluids to transition to p.o. ?Avoid sedating medications. ? ?Neurology evaluation 02/02/22: "[recommendations]: ?- Delirium precautions to include limiting sedating medications ?- Seizure precautions ?- Continue home Vimpat,  Lamictal, Onfi and Keppra  ?- Outpatient neurology follow up for cognitive testing to assess for possible incipient Lewy body dementia (sees Dr. Inocente Salles @ [facility]) ?- TIA work up to include CTA of head and neck, TTE and cardiac telemetry ?--> CTA 02/03/22: "No large vessel occlusion or proximal stenosis. No carotid bifurcation stenosis.  No carotid siphon stenosis. Mild atherosclerotic calcification of the aortic arch. ?--> TTE 02/03/22: EF 60-65%, no thrombosis or significant valvular disease  ?- EEG (ordered) ?- PT/OT/Speech ?--> PT recommending home health, TOC working on this  ?- Lamictal and Keppra levels (still pending as of 02/03/22) ?- Encourage oral hydration to resolve her xerostomia with associated mild dysarthria ?- Starting ASA 81 mg po daily ? ?Patient stable for discharge from medical standpoint but awaiting some of the above studies particularly EEG, awaiting recs from OT/SLT. Appreciate neuro recs re: dispo. Will keep on progressive unit for neuro checks unless neurology recommends deescalate  ? ?02/04/2022: Suspect with the line with dementing illness.  Follow-up with neurology on discharge.  Discharge once cleared for discharge by neurology team.  EEG is pending. ? ? ? ?Epilepsy ([place]) ?See neurology recommendations as above ?Follow results of EEG. ?Patient has had lifelong epilepsy. ? ?Normocytic anemia ?Pt is postoperative knee replacement <1 week ago ?Monitor H&H. ?Transfuse as needed. ? ?Hypokalemia ?Replaced, WNL as of 02/03/22 ?magnesium level - no concerns ? ?S/P left total knee replacement ?ASA initially held, restarted per neuro recs ?Analgesics as needed. ?Lovenox for DVT prophylaxis. ? ?Leukocytosis ?No fever or signs of infection. ?Resolved as of  ? ?Heart murmur ?Echo pending ?Likely d/t anemia but given possible TIA of course will w/u w TTE ? ?Altered mental state ?Consider [facility] consult if neuro w/u unrevealing  ? ?  ? ?VTE Ppx: lovenox ?  CODE STATUS: FULL ?Admitted from: home ?Expected Dispo:  TBD ?Barriers to discharge: neuro recs, lamictal level, Echo, improvement in mentation. May need to consider [facility] consult if neurology w/u nonrevealing.  ?Family communication: husband and daughters at bedside on medical floor  ? ? ?Objective: ?Vital signs in last 24 hours: ?Temp:  [97.5 ?F (36.4 ?C)-98.3 ?F (36.8 ?C)] 97.5 ?F (36.4 ?C) (04/10 1321) ?Pulse Rate:  [69-74] 72 (04/10 1321) ?Resp:  [18-20] 20 (04/10 1321) ?BP: (108-120)/(54-68) 120/68 (04/10 1321) ?SpO2:  [97 %-100 %] 100 % (04/10 1321) ?Weight change:  ?Last BM Date : 02/02/22 ? ?Intake/Output from previous day: ?04/09 0701 - 04/10 0700 ?In: 1640 [P.O.:960; I.V.:680] ?Out: 1500 [Urine:1500] ?Intake/Output this shift: ?No intake/output data recorded. ? ?General appearance: alert and no distress ?Resp: clear to auscultation bilaterally ?Cardio: regular rate and rhythm, S1, S2 normal, and systolic murmur: 2/6 ?GI: soft, non-tender; bowel sounds normal; no masses,  no organomegaly ?Neurologic: Mental status: Alert, oriented, thought content appropriate, alertness: alert, orientation: time, person, place, city, affect: normal ?Motor: grossly normal ? ?Lab Results: ?Recent Labs  ?  02/02/22 ?0400 02/03/22 ?0328  ?WBC 5.5 6.4  ?HGB 7.9* 8.1*  ?HCT 24.2* 25.3*  ?PLT 165 189  ? ? ?BMET ?Recent Labs  ?  02/02/22 ?0400 02/03/22 ?0328  ?NA 144 141  ?K 3.8 3.7  ?CL 106 103  ?CO2 32 32  ?GLUCOSE 96 107*  ?BUN 11 14  ?CREATININE 0.56 0.56  ?CALCIUM 8.5* 8.6*  ? ? ? ?Studies/Results: ?ECHOCARDIOGRAM COMPLETE ? ?Result Date: 02/03/2022 ?   ECHOCARDIOGRAM REPORT   Patient Name:   Andrea Crawford [facility] Date of Exam: 02/03/2022 Medical Rec #:  [id number]        Height:       62.0 in Accession #:    [id number]       Weight:       143.3 lb Date of Birth:  December 15, 1955       BSA:          1.659 m? Patient Age:    59 years         BP:           98/71 mmHg Patient Gender: F                HR:           71 bpm. Exam Location:  Inpatient Procedure: 2D Echo, Cardiac Doppler and Color Doppler  Indications:    Murmur R01.1                 Dyspnea R06.00  History:        Patient has prior history of Echocardiogram examinations, most                 recent 05/30/2020.  Sonographer:    Merrie Roof RDCS Referring Phys: [id number] [place]  1. Left ventricular ejection fraction, by estimation, is 60 to 65%. The left ventricle has normal function. Left ventricular endocardial border not optimally defined to evaluate regional wall motion. Left ventricular diastolic parameters were normal.  2. Right ventricular systolic function is normal. The right ventricular size is normal. There is normal pulmonary artery systolic pressure. The estimated right ventricular systolic pressure is 85.4 mmHg.  3. Left atrial size was mildly dilated.  4. The mitral valve is grossly normal. Trivial mitral valve regurgitation. No evidence of mitral stenosis.  5. The aortic valve is tricuspid. Aortic valve regurgitation is not visualized. No aortic stenosis  is present.  6. The inferior vena cava is normal in size with greater than 50% respiratory variability, suggesting right atrial pressure of 3 mmHg. FINDINGS  Left Ventricle: Left ventricular ejection fraction, by estimation, is 60 to 65%. The left ventricle has normal function. Left ventricular endocardial border not optimally defined to evaluate regional wall motion. The left ventricular internal cavity size was normal in size. There is no left ventricular hypertrophy. Left ventricular diastolic parameters were normal. Right Ventricle: The right ventricular size is normal. No increase in right ventricular wall thickness. Right ventricular systolic function is normal. There is normal pulmonary artery systolic pressure. The tricuspid regurgitant velocity is 2.46 m/s, and  with an assumed right atrial pressure of 3 mmHg, the estimated right ventricular systolic pressure is 28.7 mmHg. Left Atrium: Left atrial size was mildly dilated. Right Atrium: Right atrial size was  normal in size. Pericardium: There is no evidence of pericardial effusion. Mitral Valve: The mitral valve is grossly normal. Trivial mitral valve regurgitation. No evidence of mitral valve stenosis. Tricuspid V

## 2022-02-04 NOTE — Addendum Note (Signed)
Addendum  created 02/04/22 1944 by Barnet Glasgow, MD  ? Child order released for a procedure order, Clinical Note Signed, Intraprocedure Blocks edited, Intraprocedure Meds edited, SmartForm saved  ?  ?

## 2022-02-04 NOTE — Addendum Note (Signed)
Addendum  created 02/04/22 1950 by Barnet Glasgow, MD  ? Clinical Note Signed  ?  ?

## 2022-02-04 NOTE — Anesthesia Procedure Notes (Addendum)
Spinal ? ?Patient location during procedure: OR ?Start time: 01/28/2022 12:21 PM ?End time: 01/28/2022 12:26 PM ?Reason for block: surgical anesthesia ?Staffing ?Performed: anesthesiologist  ?Anesthesiologist: Barnet Glasgow, MD ?Preanesthetic Checklist ?Completed: patient identified, IV checked, risks and benefits discussed, surgical consent, monitors and equipment checked, pre-op evaluation and timeout performed ?Spinal Block ?Patient position: sitting ?Prep: DuraPrep and site prepped and draped ?Patient monitoring: heart rate, cardiac monitor, continuous pulse ox and blood pressure ?Approach: midline ?Location: L3-4 ?Injection technique: single-shot ?Needle ?Needle type: Pencan  ?Needle gauge: 24 G ?Needle length: 10 cm ?Needle insertion depth: 5 cm ?Assessment ?Sensory level: T4 ?Events: CSF return ?Additional Notes ? 1 Attempt (s). Pt tolerated procedure well. ? ? ? ?

## 2022-02-04 NOTE — Evaluation (Signed)
Speech Language Pathology Evaluation ?Patient Details ?Name: Andrea Crawford ?MRN: [id number] ?DOB: Dec 15, 1955 ?Today's Date: 02/04/2022 ?Time: 8546-2703 ?SLP Time Calculation (min) (ACUTE ONLY): 30 min ? ?Problem List:  ?Patient Active Problem List  ? Diagnosis Date Noted  ? Altered mental state 02/02/2022  ? Heart murmur 02/02/2022  ? Acute encephalopathy 02/01/2022  ? Other seborrheic keratosis 02/01/2022  ? Normocytic anemia 02/01/2022  ? Hypokalemia 02/01/2022  ? S/P left total knee replacement 02/01/2022  ? Leukocytosis 02/01/2022  ? OA (osteoarthritis) of knee 01/28/2022  ? Osteoarthritis of left knee 01/28/2022  ? Psychotic disorder ([place]) 12/27/2021  ? Copper deficiency 06/28/2020  ? Other pancytopenia ([place]) 06/09/2020  ? Physical debility 06/09/2020  ? Pancytopenia, acquired ([place])   ? Pancytopenia ([place]) 05/31/2020  ? Schizophrenia ([place]) 05/31/2020  ? Volume overload 05/31/2020  ? Symptomatic anemia   ? Leucopenia 05/30/2020  ? Peripheral edema 05/30/2020  ? Epilepsy ([place]) 05/30/2020  ? Sleep apnea 05/30/2020  ? CHF (congestive heart failure) ([place]) 05/30/2020  ? Anemia 05/29/2020  ? ?Past Medical History:  ?Past Medical History:  ?Diagnosis Date  ? Anemia 04/2020  ? Drug-induced Parkinson's disease ([place])   ? parkinson like  ? History of complex partial epilepsy   ? Murmur, cardiac   ? Osteoarthritis   ? Osteopenia   ? Pedal edema   ? Seizure disorder ([place])   ? Seizures ([place])   ? ?Past Surgical History:  ?Past Surgical History:  ?Procedure Laterality Date  ? ESOPHAGOGASTRODUODENOSCOPY    ? TONSILLECTOMY    ? as a child  ? TOTAL KNEE ARTHROPLASTY Left 01/28/2022  ? Procedure: TOTAL KNEE ARTHROPLASTY;  Surgeon: Gaynelle Arabian, MD;  Location: [facility];  Service: Orthopedics;  Laterality: Left;  ? ?HPI:  ?Patient is a 66 y.o. female with PMH: copper deficiency drug-induced Parkinson's, chronic normocytic anemia, h/o complex partial epilepsy requiring multiple antiepileptic agents, OA, osteopenia, pedal edema who  underwent an uneventful TKA on 4/3 but was brought to ED on 4/7 due to AMS since 4/5 which had reportedly been becoming progressively worse.  Husband provided details of events. She had a fall night before admission without sustaining any injuries and did not hit her head. Her confusion progressed from being relatively mild to her then having trouble with time and situation orientation, and slurring her words. In ED, she was afebrile, oxygen saturations 100% on RA, pulse 74. MRI brain did not show any acute intracranial process and no etiology seen for the patient's AMS, CT head did not show any acute intracranial abnormality. EEG completed on 4/10 with results pending.  ? ?Assessment / Plan / Recommendation ?Clinical Impression ? Patient presents with a mild-moderate impairments in cognition, language and speech production. She does reportedly have premorbid deficits in these areas and although diffcult to fully differentiate, per talking to family she does not appear to be at her baseline. SLP administered the SLUMS ([place] Mental Status exam) for which she received a score of 20 out of a possible 30 (scoring categories of SLUMS for people with [employer] are as follows: Dementia: 1-20, Mild Neurocognitive Disorder: 21-26, Normal: 27-30). Patient's results indicate need for further testing and do not provide a clinical diagnosis. She recalled all 5 of the words after 2 minute delay, recalled 3 of 4 delayed recall questions based on short story. She demonstrated awareness to errors when completing clock drawing but was unable to correct without assistance. (of note, when given a replacement circle to try to draw  clock a second time, she did then demonstrate adequate spacing of numbers but only had hour hand in right position.) She missed one point for stating day of week as "Tuesday" (it is Monday) and she was slightly off with math calculation, stating answer as $79 when correct answer is  $77. SLP did observe that patient would speak rapidly at times, did not demonstrate adequate topic maintenance and she would hear her husband or daughter say a word that would trigger her to then describe something related to that word but that was not related to conversation. She did perseverate at times, such as after finishing clock drawing and a little later when SLP asking her a different question, she was still thinking about and talking some about her errors with spacing numbers on clock. SLP spoke with patient, spouse and daughter about recommendations for home health SLP services after discharge and recommended focusing on daily routines, organization, structure, attention (especially related to medication management as this has been an issue for her in the past) and completing important tasks at a scheduled time as well as in a quiet environment to maximize concentration. SLP is not recommending further acute care level services but is recommending [facility] SLP services upon discharge from hospital. ?   ?SLP Assessment ? SLP Recommendation/Assessment: All further Speech Lanaguage Pathology  needs can be addressed in the next venue of care ?SLP Visit Diagnosis: Cognitive communication deficit (R41.841)  ?  ?Recommendations for follow up therapy are one component of a multi-disciplinary discharge planning process, led by the attending physician.  Recommendations may be updated based on patient status, additional functional criteria and insurance authorization. ?   ?Follow Up Recommendations ? Home health SLP  ?  ?Assistance Recommended at Discharge ? Frequent or constant Supervision/Assistance  ?Functional Status Assessment Patient has had a recent decline in their functional status and demonstrates the ability to make significant improvements in function in a reasonable and predictable amount of time.  ?Frequency and Duration    ?  ?  ?   ?SLP Evaluation ?Cognition ? Overall Cognitive Status: Difficult to  assess ?Arousal/Alertness: Awake/alert ?Orientation Level: Oriented to person;Oriented to place;Oriented to time ?Year: 2023 ?Month: April ?Day of Week: Incorrect ?Memory: Impaired ?Memory Impairment: Storage deficit ?Awareness: Impaired ?Awareness Impairment: Emergent impairment;Anticipatory impairment ?Problem Solving: Impaired ?Problem Solving Impairment: Verbal complex ?Executive Function: Organizing;Self Monitoring ?Organizing: Impaired ?Organizing Impairment: Verbal complex ?Self Monitoring: Impaired ?Self Monitoring Impairment: Verbal basic;Verbal complex;Functional basic ?Behaviors: Impulsive  ?  ?   ?Comprehension ? Auditory Comprehension ?Overall Auditory Comprehension: Impaired at baseline ?Yes/No Questions: Within Functional Limits ?Commands: Within Functional Limits ?Interfering Components: Processing speed;Working Marine scientist;Attention ?EffectiveTechniques: Extra processing time;Increased volume;Repetition ?Visual Recognition/Discrimination ?Discrimination: Not tested ?Reading Comprehension ?Reading Status: Not tested  ?  ?Expression Expression ?Primary Mode of Expression: Verbal ?Verbal Expression ?Overall Verbal Expression: Impaired ?Initiation: No impairment ?Level of Generative/Spontaneous Verbalization: Conversation;Sentence ?Pragmatics: Impairment ?Impairments: Topic maintenance;Interpretation of nonverbal communication;Turn Taking ?Interfering Components: Attention;Speech intelligibility ?Effective Techniques: Open ended questions ?Non-Verbal Means of Communication: Communication board   ?Oral / Motor ? Oral Motor/Sensory Function ?Overall Oral Motor/Sensory Function: Within functional limits ?Motor Speech ?Overall Motor Speech: Appears within functional limits for tasks assessed ?Respiration: Within functional limits ?Resonance: Within functional limits ?Articulation: Impaired ?Level of Impairment: Conversation ?Intelligibility: Intelligibility reduced ?Word: 75-100% accurate ?Phrase: 75-100%  accurate ?Sentence: 75-100% accurate ?Conversation: 50-74% accurate ?Motor Planning: Witnin functional limits ?Motor Speech Errors: Not applicable ?Effective Techniques: Slow rate;Increased vocal intensity   ?        ?  J

## 2022-02-04 NOTE — Procedures (Signed)
Patient Name: Andrea Crawford  ?MRN: [id number]  ?Epilepsy Attending: Lora Havens  ?Referring Physician/Provider: Kerney Elbe, MD ?Date: 02/04/2022 ?Duration: 21.27 mins ? ?Patient history: 66 year old female with history of epilepsy who presented with fall and fluctuating altered mental status.  EEG to evaluate for seizures. ? ?Level of alertness: Awake ? ?AEDs during EEG study: LTG, LEV, LCM, Onfi ? ?Technical aspects: This EEG study was done with scalp electrodes positioned according to the 10-20 International system of electrode placement. Electrical activity was acquired at a sampling rate of '500Hz'$  and reviewed with a high frequency filter of '70Hz'$  and a low frequency filter of '1Hz'$ . EEG data were recorded continuously and digitally stored.  ? ?Description: The posterior dominant rhythm consists of 9 Hz activity of moderate voltage (25-35 uV) seen predominantly in posterior head regions, symmetric and reactive to eye opening and eye closing. There is an excessive amount of 15 to 18 Hz beta activity distributed symmetrically and diffusely. Hyperventilation and photic stimulation were not performed.    ? ?ABNORMALITY ?- Excessive beta, generalized ? ?IMPRESSION: ?This study is within normal limits. The excessive beta activity seen in the background is most likely due to the effect of benzodiazepine and is a benign EEG pattern. No seizures or epileptiform discharges were seen throughout the recording. ? ?Lora Havens  ? ?

## 2022-02-05 MED ORDER — ARIPIPRAZOLE 30 MG PO TABS: 30.0000 mg | ORAL_TABLET | ORAL | 0 refills | Status: AC

## 2022-02-05 MED ORDER — ESCITALOPRAM OXALATE 10 MG PO TABS
10.0000 mg | ORAL_TABLET | Freq: Every day | ORAL | 0 refills | Status: AC
Start: 2022-02-05 — End: 2022-03-07

## 2022-02-05 MED ORDER — ASPIRIN 81 MG PO TBEC: 81.0000 mg | DELAYED_RELEASE_TABLET | Freq: Every day | ORAL | 11 refills | Status: AC

## 2022-02-05 NOTE — TOC Progression Note (Signed)
Transition of Care (TOC) - Progression Note  ? ? ?Patient Details  ?Name: Andrea Crawford ?MRN: [id number] ?Date of Birth: December 05, 1955 ? ?Transition of Care (TOC) CM/SW Contact  ?Purcell Mouton, RN ?Phone Number: ?02/05/2022, 1:30 PM ? ?Clinical Narrative:    ?Spoke with pt and husband at bedside concerning discharge needs. Pt will discharge home with [facility] HHRN/PT/OT/NA/ST. Pt is asking for Rob, HHPT. Referral given to in house rep [place] with [facility].  ? ? ?Expected Discharge Plan: Home/Self Care ?Barriers to Discharge: Continued Medical Work up ? ?Expected Discharge Plan and Services ?Expected Discharge Plan: Home/Self Care ?  ?  ?  ?Living arrangements for the past 2 months: [place] ?                ?  ?  ?  ?  ?  ?  ?  ?  ?  ?  ? ? ?Social Determinants of Health (SDOH) Interventions ?  ? ?Readmission Risk Interventions ? ?  06/04/2020  ?  2:18 PM  ?Readmission Risk Prevention Plan  ?Transportation Screening Complete  ? ? ?

## 2022-02-05 NOTE — Plan of Care (Signed)
  Problem: Activity: Goal: Risk for activity intolerance will decrease Outcome: Progressing   Problem: Nutrition: Goal: Adequate nutrition will be maintained Outcome: Progressing   Problem: Coping: Goal: Level of anxiety will decrease Outcome: Progressing   

## 2022-02-05 NOTE — Care Management Important Message (Signed)
Important Message ? ?Patient Details IM Letter given to the Patient ?Name: Andrea Crawford ?MRN: [id number] ?Date of Birth: 10-24-1956 ? ? ?Medicare Important Message Given:  Yes ? ? ? ? ?Kerin Salen ?02/05/2022, 10:42 AM ?

## 2022-02-05 NOTE — Progress Notes (Signed)
? ?  Subjective: ?  Status post left total knee arthroplasty on 01/28/2022 ?Patient reports pain as mild.   ?Patient seen in rounds for Dr. Wynelle Link. ?Patient is resting comfortably in bed. Appears improved this morning. Denies any issues regarding the knee.  ? ?Objective: ?Vital signs in last 24 hours: ?Temp:  [97.5 ?F (36.4 ?C)-98.2 ?F (36.8 ?C)] 98.2 ?F (36.8 ?C) (04/11 0555) ?Pulse Rate:  [67-75] 67 (04/11 0555) ?Resp:  [20-21] 20 (04/11 0555) ?BP: (113-120)/(66-68) 115/66 (04/11 0555) ?SpO2:  [98 %-100 %] 98 % (04/11 0555) ? ?Intake/Output from previous day: ? ?Intake/Output Summary (Last 24 hours) at 02/05/2022 0809 ?Last data filed at 02/05/2022 0149 ?Gross per 24 hour  ?Intake 2131.25 ml  ?Output 1200 ml  ?Net 931.25 ml  ?  ?Intake/Output this shift: ?No intake/output data recorded. ? ?Labs: ?Recent Labs  ?  02/03/22 ?0328  ?HGB 8.1*  ? ?Recent Labs  ?  02/03/22 ?0328  ?WBC 6.4  ?RBC 2.62*  ?HCT 25.3*  ?PLT 189  ? ?Recent Labs  ?  02/03/22 ?0328  ?NA 141  ?K 3.7  ?CL 103  ?CO2 32  ?BUN 14  ?CREATININE 0.56  ?GLUCOSE 107*  ?CALCIUM 8.6*  ? ?No results for input(s): LABPT, INR in the last 72 hours. ? ?Exam: ?General - Patient is Alert and Oriented ?Extremity - Neurologically intact ?Neurovascular intact ?Sensation intact distally ?Dorsiflexion/Plantar flexion intact ?Mild bruising throughout the lower extremity from tourniquet ?Mild swelling ?Dressing/Incision - clean, dry, no drainage ?Motor Function - intact, moving foot and toes well on exam.  ? ?Past Medical History:  ?Diagnosis Date  ? Anemia 04/2020  ? Drug-induced Parkinson's disease ([place])   ? parkinson like  ? History of complex partial epilepsy   ? Murmur, cardiac   ? Osteoarthritis   ? Osteopenia   ? Pedal edema   ? Seizure disorder ([place])   ? Seizures ([place])   ? ? ?Assessment/Plan: ?   ?Principal Problem: ?  Acute encephalopathy ?Active Problems: ?  Epilepsy ([place]) ?  Normocytic anemia ?  Hypokalemia ?  S/P left total knee replacement ?  Leukocytosis ?   Altered mental state ?  Heart murmur ? ?Estimated body mass index is 26.21 kg/m? as calculated from the following: ?  Height as of this encounter: '5\' 2"'$  (1.575 m). ?  Weight as of this encounter: 65 kg. ?Up with therapy ? ?DVT Prophylaxis - Aspirin ?Weight-bearing as tolerated ? ?Continue ambulation and exercises with physical therapy throughout stay. Would recommend ice and elevation of the left leg, has some mild swelling today. Discussed exercises she can work on while in bed.  ? ?Will either resume OPPT upon discharge or will arrange HHPT as backup plan based on therapy's recommendations. ? ?Theresa Duty, PA-C ?Orthopedic Surgery ?[phone] ?02/05/2022, 8:09 AM ? ?

## 2022-02-05 NOTE — Discharge Summary (Signed)
?Physician Discharge Summary ?  ?Patient: Andrea Crawford MRN: [id number] DOB: [birth year]  ?Admit date:     02/01/2022  ?Discharge date: 02/05/22  ?Discharge Physician: Vanna Scotland  ? ?PCP: Marda Stalker, PA-C  ?  ? ?Discharge Diagnoses: ?Principal Problem: ?  Acute encephalopathy ?Active Problems: ?  Epilepsy ([place]) ?  Normocytic anemia ?  Hypokalemia ?  S/P left total knee replacement ?  Leukocytosis ?  Altered mental state ?  Heart murmur ? ?Resolved Problems: ?  * No resolved hospital problems. * ? ?Hospital Course: ?66 y.o. female with medical history significant of copper deficiency, drug-induced Parkinson, chronic normocytic anemia, history of complex partial epilepsy requiring multiple antiepileptic agents, osteoarthritis, osteopenia, pedal edema who underwent an uneventful left TKA on Monday but is being brought today to the emergency department by her husband due to altered mental status since Wednesday that has become progressively worse over the past 2 days.  She had a fall last night with sustaining any injuries.  She stated she did not hit her head.  Her confusion was relatively mild until around 1045 this morning when it became a lot worse.  She has some trouble knowing time, date and situation.  Her speech is mildly slower at baseline but she was slurring her words a lot more according to her husband.   She has not taken oxycodone or the muscle relaxant since Wednesday.  She had a similar episode in the past when she was walking with her husband, which was not as pronounced at this episode and subsequently resolved.  There has not been any complaints of fever, chills, sore throat, rhinorrhea, dyspnea, chest pain or lightheadedness.  No abdominal pain, diarrhea, but had an episode of emesis this morning and has been constipated for several days.  No flank pain, dysuria, frequency or hematuria. ? ? ? ? ? ?Assessment and Plan: ?* Acute encephalopathy ?No clear etiology, patient remaining altered  yesterday but improving to baseline by the afternoon  ?Observation/PCU with frequent neurochecks ?MRI brain 02/01/22 shows nothing to explain AMS. ?Continue IV fluids to transition to p.o. ?Avoid sedating medications. ? ?Neurology evaluation 02/02/22: "[recommendations]: ?- Delirium precautions to include limiting sedating medications ?- Seizure precautions ?- Continue home Vimpat, Lamictal, Onfi and Keppra  ?- Outpatient neurology follow up for cognitive testing to assess for possible incipient Lewy body dementia (sees Dr. Inocente Salles @ [facility]) ?- TIA work up to include CTA of head and neck, TTE and cardiac telemetry ?--> CTA 02/03/22: "No large vessel occlusion or proximal stenosis. No carotid bifurcation stenosis.  No carotid siphon stenosis. Mild atherosclerotic calcification of the aortic arch. ?--> TTE 02/03/22: EF 60-65%, no thrombosis or significant valvular disease  ?- EEG (ordered) ?- PT/OT/Speech ?--> PT recommending home health, TOC working on this  ?- Lamictal and Keppra levels (still pending as of 02/03/22) ?- Encourage oral hydration to resolve her xerostomia with associated mild dysarthria ?- Starting ASA 81 mg po daily ? ?Patient stable for discharge from medical standpoint but awaiting some of the above studies particularly EEG, awaiting recs from OT/SLT. Appreciate neuro recs re: dispo. Will keep on progressive unit for neuro checks unless neurology recommends deescalate  ? ? ? ?Heart murmur ?Echo pending ?Likely d/t anemia but given possible TIA of course will w/u w TTE ? ?Altered mental state ?Consider BH consult if neuro w/u unrevealing  ? ?Leukocytosis ?No fever or signs of infection. ?Resolved as of  ? ?S/P left total knee replacement ?ASA initially held, restarted per neuro  recs ?Analgesics as needed. ?Lovenox for DVT prophylaxis. ? ?Hypokalemia ?Replaced, WNL as of 02/03/22 ?magnesium level - no concerns ? ?Normocytic anemia ?Pt is postoperative knee replacement <1 week ago ?Monitor H&H. ?Transfuse as  needed. ? ?Epilepsy ([place]) ?See neurology recommendations as above ? ? ? ? ?  ? ?Pain control - [employer] Controlled Substance Reporting System database was reviewed. and patient was instructed, not to drive, operate heavy machinery, perform activities at heights, swimming or participation in water activities or provide baby-sitting services while on Pain, Sleep and Anxiety Medications; until their outpatient Physician has advised to do so again. Also recommended to not to take more than prescribed Pain, Sleep and Anxiety Medications.  ?Consultants: Neurology, Orthopedics ?Procedures performed:  EEG- no acute seizure activity  ?Disposition: Home ?Diet recommendation:  ?Discharge Diet Orders (From admission, onward)  ? ?  Start     Ordered  ? 02/05/22 0000  Diet - low sodium heart healthy       ? 02/05/22 1340  ? ?  ?  ? ?  ? ?DISCHARGE MEDICATION: ?Allergies as of 02/05/2022   ?No Known Allergies ?  ? ?  ?Medication List  ?  ? ?STOP taking these medications   ? ?methocarbamol 500 MG tablet ?Commonly known as: ROBAXIN ?  ?oxyCODONE 5 MG immediate release tablet ?Commonly known as: Oxy IR/ROXICODONE ?  ? ?  ? ?TAKE these medications   ? ?amantadine 100 MG capsule ?Commonly known as: SYMMETREL ?Take 100 mg by mouth 2 (two) times daily. 930 & 2130 ?  ?ARIPiprazole 30 MG tablet ?Commonly known as: ABILIFY ?Take 1 tablet (30 mg total) by mouth daily. ?What changed:  ?when to take this ?additional instructions ?  ?aspirin 81 MG EC tablet ?Take 1 tablet (81 mg total) by mouth daily. Swallow whole. ?Start taking on: February 06, 2022 ?What changed:  ?medication strength ?how much to take ?when to take this ?additional instructions ?  ?escitalopram 10 MG tablet ?Commonly known as: LEXAPRO ?Take 1 tablet (10 mg total) by mouth at bedtime. ?What changed: additional instructions ?  ?lacosamide 50 MG Tabs tablet ?Commonly known as: VIMPAT ?Take 50-150 mg by mouth See admin instructions. Pt takes '50mg'$  q am; '50mg'$  @ noon; then '150mg'$   at bedtime. BRAND NAME ONLY ?*Brand Name * ?  ?lamoTRIgine 200 MG tablet ?Commonly known as: LAMICTAL ?Take 200-400 mg by mouth See admin instructions. '200mg'$  q am; '200mg'$  at noon; then '400mg'$  at bedtime. ?  ?levETIRAcetam 1000 MG tablet ?Commonly known as: KEPPRA ?Take 500-1,000 mg by mouth See admin instructions. '500mg'$  q am; '500mg'$  at noon; then '1000mg'$  qhs. ?  ?multivitamin with minerals tablet ?Take 1 tablet by mouth daily. 1230 ?  ?omeprazole 40 MG capsule ?Commonly known as: PRILOSEC ?Take 40 mg by mouth daily. 0930 ?  ?Onfi 10 MG tablet ?Generic drug: cloBAZam ?Take 5-15 mg by mouth See admin instructions. Take 5 MG in the morning at 0930 and  (15 MG) in the evening at 2130 ?  ?PRESCRIPTION MEDICATION ?Inhale into the lungs at bedtime. CPAP ?  ? ?  ? ? ?Discharge Exam: ?Danley Danker Weights  ? 02/01/22 1804  ?Weight: 65 kg  ?General appearance: alert and no distress ?Resp: clear to auscultation bilaterally ?Cardio: regular rate and rhythm, S1, S2 normal, and systolic murmur: 2/6 ?GI: soft, non-tender; bowel sounds normal; no masses,  no organomegaly ?Neurologic: Mental status: Alert, oriented, thought content appropriate, alertness: alert, orientation: time, person, place, city, affect: normal ?Motor: grossly normal ?  ? ?  Condition at discharge: good ? ?The results of significant diagnostics from this hospitalization (including imaging, microbiology, ancillary and laboratory) are listed below for reference.  ? ?Imaging Studies: ?CT Head Wo Contrast ? ?Result Date: 02/01/2022 ?CLINICAL DATA:  Mental status changes of unknown cause EXAM: CT HEAD WITHOUT CONTRAST TECHNIQUE: Contiguous axial images were obtained from the base of the skull through the vertex without intravenous contrast. RADIATION DOSE REDUCTION: This exam was performed according to the departmental dose-optimization program which includes automated exposure control, adjustment of the mA and/or kV according to patient size and/or use of iterative reconstruction  technique. COMPARISON:  06/02/2020 MRI.  05/30/2020 head CT. FINDINGS: Brain: Cerebral and cerebellar atrophy again identified. No mass lesion, hemorrhage, hydrocephalus, acute infarct, intra-axial, or extra-axia

## 2022-02-05 NOTE — Progress Notes (Signed)
Physical Therapy Treatment ?Patient Details ?Name: Andrea Crawford ?MRN: [id number] ?DOB: 12-30-1955 ?Today's Date: 02/05/2022 ? ? ?History of Present Illness Pt is 65 yo female admitted 02/01/22 with altered mental status, acute encephalopathy - no clear etiology. Pt had L TKA on 01/28/22.  Pt with hx of copper deficiency, drug-induced Parkinson tremor, chronic normocytic anemia, complex partial epilepsy, OA, osteopenia, and pedal edema. ? ?  ?PT Comments  ? ? Re admit 2nd fall/AMS ?Spouse present during session.  Pt's cognition is at prior level according to Spouse.  He stated after she left the hospital, pt was prescribed pain medication.  In a course of a few days, pt's mentality/coordination and confusion increased.  He stated she also fell twice.  He brought her to ED. ?Pt currently appears at prior level of cognition and alertness.  Assisted OOB to bathroom went well.  General transfer comment: assisted with toilet transfer, pt was self able to lower and rise with proper use of hands and able to perform self peri care seated.  No LOB.  Performed well. General Gait Details: first assisted with amb to bathroom.  Pt was steady enough NOT to need any physical assist.  Posture is poor/forward flexed but Spouse stated this is her.  Pt tolerated a functional distance amb in hallway 225 feet with Spouse "hands on" using gait belt.  General stair comments: with Spouse "hands on" practiced up/down 5 steps using B hands on RIGHT rail.  Pt was able to recall up with the good and down with the bad from prior.  Spouse educated on safe handling that he also have ONE hand on the rail and the other on his wife.  Tolerated well.  ?Spouse with multiple questions.  Extended treatment session to address all. ?Pt will need HH PT.  Pt has met her mobility goals to D/C today pending medical. ? ?  ?Recommendations for follow up therapy are one component of a multi-disciplinary discharge planning process, led by the attending physician.   Recommendations may be updated based on patient status, additional functional criteria and insurance authorization. ? ?Follow Up Recommendations ? Home health PT ?  ?  ?Assistance Recommended at Discharge Frequent or constant Supervision/Assistance  ?Patient can return home with the following A little help with walking and/or transfers;A little help with bathing/dressing/bathroom;Assistance with cooking/housework;Help with stairs or ramp for entrance;Assist for transportation ?  ?Equipment Recommendations ? None recommended by PT  ?  ?Recommendations for Other Services   ? ? ?  ?Precautions / Restrictions Precautions ?Precautions: Fall ?Precaution Comments: instructed no pillow under knee ?Restrictions ?Weight Bearing Restrictions: No ?LLE Weight Bearing: Weight bearing as tolerated  ?  ? ?Mobility ? Bed Mobility ?  ?Bed Mobility: Supine to Sit ?  ?  ?Supine to sit: Supervision ?  ?  ?General bed mobility comments: pt self able with increased time. ?  ? ?Transfers ?Overall transfer level: Needs assistance ?Equipment used: Rolling walker (2 wheels) ?Transfers: Sit to/from Stand, Bed to chair/wheelchair/BSC ?Sit to Stand: Supervision ?  ?Step pivot transfers: Supervision ?  ?  ?  ?General transfer comment: assisted with toilet transfer, pt was self able to lower and rise with proper use of hands and able to perform self peri care seated.  No LOB.  Performed well. ?  ? ?Ambulation/Gait ?Ambulation/Gait assistance: Supervision, Min guard ?Gait Distance (Feet): 225 Feet ?Assistive device: Rolling walker (2 wheels) ?Gait Pattern/deviations: Decreased step length - right, Decreased step length - left, Drifts right/left, Decreased dorsiflexion -  left ?Gait velocity: decreased ?  ?  ?General Gait Details: first assisted with amb to bathroom.  Pt was steady enough NOT to need any physical assist.  Posture is poor/forward flexed but Spouse stated this is her.  Pt tolerated a functional distance amb in hallway 225 feet with  Spouse "hands on" using gait belt. ? ? ?Stairs ?Stairs: Yes ?Stairs assistance: Supervision, Min guard ?Stair Management: One rail Right, Step to pattern, Forwards ?Number of Stairs: 5 ?General stair comments: with Spouse "hands on" practiced up/down 5 steps using B hands on RIGHT rail.  Pt was able to recall up with the good and down with the bad from prior.  Spouse educated on safe handling that he also have ONE hand on the rail and the other on his wife.  Tolerated well. ? ? ?Wheelchair Mobility ?  ? ?Modified Rankin (Stroke Patients Only) ?  ? ? ?  ?Balance   ?  ?  ?  ?  ?  ?  ?  ?  ?  ?  ?  ?  ?  ?  ?  ?  ?  ?  ?  ? ?  ?Cognition Arousal/Alertness: Awake/alert ?Behavior During Therapy: [facility] for tasks assessed/performed ?Overall Cognitive Status: Within Functional Limits for tasks assessed ?  ?  ?  ?  ?  ?  ?  ?  ?  ?  ?  ?  ?  ?  ?  ?  ?General Comments: Appears at prior level, pleasant, following all directions and moving well coordinated/steady. ?  ?  ? ?  ?Exercises   ? ?  ?General Comments   ?  ?  ? ?Pertinent Vitals/Pain Pain Assessment ?Pain Assessment: No/denies pain  ? ? ?Home Living   ?  ?  ?  ?  ?  ?  ?  ?  ?  ?   ?  ?Prior Function    ?  ?  ?   ? ?PT Goals (current goals can now be found in the care plan section) Progress towards PT goals: Progressing toward goals ? ?  ?Frequency ? ? ? Min 4X/week ? ? ? ?  ?PT Plan Current plan remains appropriate  ? ? ?Co-evaluation   ?  ?  ?  ?  ? ?  ?AM-PAC PT "6 Clicks" Mobility   ?Outcome Measure ? Help needed turning from your back to your side while in a flat bed without using bedrails?: A Little ?Help needed moving from lying on your back to sitting on the side of a flat bed without using bedrails?: A Little ?Help needed moving to and from a bed to a chair (including a wheelchair)?: A Little ?Help needed standing up from a chair using your arms (e.g., wheelchair or bedside chair)?: A Little ?Help needed to walk in hospital room?: A Little ?Help needed climbing  3-5 steps with a railing? : A Little ?6 Click Score: 18 ? ?  ?End of Session Equipment Utilized During Treatment: Gait belt ?Activity Tolerance: Patient tolerated treatment well ?Patient left: in chair;with call bell/phone within reach;with family/visitor present ?Nurse Communication: Mobility status ?PT Visit Diagnosis: Difficulty in walking, not elsewhere classified (R26.2) ?Pain - Right/Left: Left ?Pain - part of body: Knee ?  ? ? ?Time: 3875-6433 ?PT Time Calculation (min) (ACUTE ONLY): 44 min ? ?Charges:  $Gait Training: 23-37 mins ?$Therapeutic Activity: 8-22 mins          ?          ? ?  Rica Koyanagi  PTA ?Acute  Rehabilitation Services ?Pager      [phone] ?Office      [phone] ? ?

## 2022-02-06 DIAGNOSIS — G40209 Localization-related (focal) (partial) symptomatic epilepsy and epileptic syndromes with complex partial seizures, not intractable, without status epilepticus: Secondary | ICD-10-CM | POA: Diagnosis not present

## 2022-02-06 DIAGNOSIS — I700 Atherosclerosis of aorta: Secondary | ICD-10-CM | POA: Diagnosis not present

## 2022-02-06 DIAGNOSIS — G9340 Encephalopathy, unspecified: Secondary | ICD-10-CM | POA: Diagnosis not present

## 2022-02-06 DIAGNOSIS — G2119 Other drug induced secondary parkinsonism: Secondary | ICD-10-CM | POA: Diagnosis not present

## 2022-02-06 DIAGNOSIS — E876 Hypokalemia: Secondary | ICD-10-CM | POA: Diagnosis not present

## 2022-02-06 DIAGNOSIS — Z9181 History of falling: Secondary | ICD-10-CM | POA: Diagnosis not present

## 2022-02-06 DIAGNOSIS — Z471 Aftercare following joint replacement surgery: Secondary | ICD-10-CM | POA: Diagnosis not present

## 2022-02-06 DIAGNOSIS — I081 Rheumatic disorders of both mitral and tricuspid valves: Secondary | ICD-10-CM | POA: Diagnosis not present

## 2022-02-06 DIAGNOSIS — D649 Anemia, unspecified: Secondary | ICD-10-CM | POA: Diagnosis not present

## 2022-02-06 DIAGNOSIS — Z96652 Presence of left artificial knee joint: Secondary | ICD-10-CM | POA: Diagnosis not present

## 2022-02-06 DIAGNOSIS — I119 Hypertensive heart disease without heart failure: Secondary | ICD-10-CM | POA: Diagnosis not present

## 2022-02-06 DIAGNOSIS — Z7982 Long term (current) use of aspirin: Secondary | ICD-10-CM | POA: Diagnosis not present

## 2022-02-06 DIAGNOSIS — E610 Copper deficiency: Secondary | ICD-10-CM | POA: Diagnosis not present

## 2022-02-06 DIAGNOSIS — M8580 Other specified disorders of bone density and structure, unspecified site: Secondary | ICD-10-CM | POA: Diagnosis not present

## 2022-02-08 DIAGNOSIS — Z471 Aftercare following joint replacement surgery: Secondary | ICD-10-CM | POA: Diagnosis not present

## 2022-02-08 DIAGNOSIS — G9340 Encephalopathy, unspecified: Secondary | ICD-10-CM | POA: Diagnosis not present

## 2022-02-08 DIAGNOSIS — G40209 Localization-related (focal) (partial) symptomatic epilepsy and epileptic syndromes with complex partial seizures, not intractable, without status epilepticus: Secondary | ICD-10-CM | POA: Diagnosis not present

## 2022-02-08 DIAGNOSIS — Z96652 Presence of left artificial knee joint: Secondary | ICD-10-CM | POA: Diagnosis not present

## 2022-02-08 DIAGNOSIS — I119 Hypertensive heart disease without heart failure: Secondary | ICD-10-CM | POA: Diagnosis not present

## 2022-02-08 DIAGNOSIS — G2119 Other drug induced secondary parkinsonism: Secondary | ICD-10-CM | POA: Diagnosis not present

## 2022-02-11 DIAGNOSIS — Z96652 Presence of left artificial knee joint: Secondary | ICD-10-CM | POA: Diagnosis not present

## 2022-02-11 DIAGNOSIS — G40209 Localization-related (focal) (partial) symptomatic epilepsy and epileptic syndromes with complex partial seizures, not intractable, without status epilepticus: Secondary | ICD-10-CM | POA: Diagnosis not present

## 2022-02-11 DIAGNOSIS — I119 Hypertensive heart disease without heart failure: Secondary | ICD-10-CM | POA: Diagnosis not present

## 2022-02-11 DIAGNOSIS — Z471 Aftercare following joint replacement surgery: Secondary | ICD-10-CM | POA: Diagnosis not present

## 2022-02-11 DIAGNOSIS — G2119 Other drug induced secondary parkinsonism: Secondary | ICD-10-CM | POA: Diagnosis not present

## 2022-02-11 DIAGNOSIS — G9340 Encephalopathy, unspecified: Secondary | ICD-10-CM | POA: Diagnosis not present

## 2022-02-12 DIAGNOSIS — G2119 Other drug induced secondary parkinsonism: Secondary | ICD-10-CM | POA: Diagnosis not present

## 2022-02-12 DIAGNOSIS — G40209 Localization-related (focal) (partial) symptomatic epilepsy and epileptic syndromes with complex partial seizures, not intractable, without status epilepticus: Secondary | ICD-10-CM | POA: Diagnosis not present

## 2022-02-12 DIAGNOSIS — I119 Hypertensive heart disease without heart failure: Secondary | ICD-10-CM | POA: Diagnosis not present

## 2022-02-12 DIAGNOSIS — Z96652 Presence of left artificial knee joint: Secondary | ICD-10-CM | POA: Diagnosis not present

## 2022-02-12 DIAGNOSIS — G9340 Encephalopathy, unspecified: Secondary | ICD-10-CM | POA: Diagnosis not present

## 2022-02-12 DIAGNOSIS — Z471 Aftercare following joint replacement surgery: Secondary | ICD-10-CM | POA: Diagnosis not present

## 2022-02-13 DIAGNOSIS — Z471 Aftercare following joint replacement surgery: Secondary | ICD-10-CM | POA: Diagnosis not present

## 2022-02-13 DIAGNOSIS — G9340 Encephalopathy, unspecified: Secondary | ICD-10-CM | POA: Diagnosis not present

## 2022-02-13 DIAGNOSIS — I119 Hypertensive heart disease without heart failure: Secondary | ICD-10-CM | POA: Diagnosis not present

## 2022-02-13 DIAGNOSIS — M170 Bilateral primary osteoarthritis of knee: Secondary | ICD-10-CM | POA: Diagnosis not present

## 2022-02-13 DIAGNOSIS — R350 Frequency of micturition: Secondary | ICD-10-CM | POA: Diagnosis not present

## 2022-02-13 DIAGNOSIS — G2119 Other drug induced secondary parkinsonism: Secondary | ICD-10-CM | POA: Diagnosis not present

## 2022-02-13 DIAGNOSIS — G40209 Localization-related (focal) (partial) symptomatic epilepsy and epileptic syndromes with complex partial seizures, not intractable, without status epilepticus: Secondary | ICD-10-CM | POA: Diagnosis not present

## 2022-02-13 DIAGNOSIS — T50905D Adverse effect of unspecified drugs, medicaments and biological substances, subsequent encounter: Secondary | ICD-10-CM | POA: Diagnosis not present

## 2022-02-13 DIAGNOSIS — Z96652 Presence of left artificial knee joint: Secondary | ICD-10-CM | POA: Diagnosis not present

## 2022-02-14 DIAGNOSIS — Z96652 Presence of left artificial knee joint: Secondary | ICD-10-CM | POA: Diagnosis not present

## 2022-02-14 DIAGNOSIS — I119 Hypertensive heart disease without heart failure: Secondary | ICD-10-CM | POA: Diagnosis not present

## 2022-02-14 DIAGNOSIS — G40209 Localization-related (focal) (partial) symptomatic epilepsy and epileptic syndromes with complex partial seizures, not intractable, without status epilepticus: Secondary | ICD-10-CM | POA: Diagnosis not present

## 2022-02-14 DIAGNOSIS — G9340 Encephalopathy, unspecified: Secondary | ICD-10-CM | POA: Diagnosis not present

## 2022-02-14 DIAGNOSIS — G2119 Other drug induced secondary parkinsonism: Secondary | ICD-10-CM | POA: Diagnosis not present

## 2022-02-14 DIAGNOSIS — Z471 Aftercare following joint replacement surgery: Secondary | ICD-10-CM | POA: Diagnosis not present

## 2022-02-18 DIAGNOSIS — Z471 Aftercare following joint replacement surgery: Secondary | ICD-10-CM | POA: Diagnosis not present

## 2022-02-18 DIAGNOSIS — Z96652 Presence of left artificial knee joint: Secondary | ICD-10-CM | POA: Diagnosis not present

## 2022-02-18 DIAGNOSIS — G2119 Other drug induced secondary parkinsonism: Secondary | ICD-10-CM | POA: Diagnosis not present

## 2022-02-18 DIAGNOSIS — I119 Hypertensive heart disease without heart failure: Secondary | ICD-10-CM | POA: Diagnosis not present

## 2022-02-18 DIAGNOSIS — G9340 Encephalopathy, unspecified: Secondary | ICD-10-CM | POA: Diagnosis not present

## 2022-02-18 DIAGNOSIS — G40209 Localization-related (focal) (partial) symptomatic epilepsy and epileptic syndromes with complex partial seizures, not intractable, without status epilepticus: Secondary | ICD-10-CM | POA: Diagnosis not present

## 2022-02-19 DIAGNOSIS — G2119 Other drug induced secondary parkinsonism: Secondary | ICD-10-CM | POA: Diagnosis not present

## 2022-02-19 DIAGNOSIS — Z96652 Presence of left artificial knee joint: Secondary | ICD-10-CM | POA: Diagnosis not present

## 2022-02-19 DIAGNOSIS — G9340 Encephalopathy, unspecified: Secondary | ICD-10-CM | POA: Diagnosis not present

## 2022-02-19 DIAGNOSIS — Z471 Aftercare following joint replacement surgery: Secondary | ICD-10-CM | POA: Diagnosis not present

## 2022-02-19 DIAGNOSIS — I119 Hypertensive heart disease without heart failure: Secondary | ICD-10-CM | POA: Diagnosis not present

## 2022-02-19 DIAGNOSIS — G40209 Localization-related (focal) (partial) symptomatic epilepsy and epileptic syndromes with complex partial seizures, not intractable, without status epilepticus: Secondary | ICD-10-CM | POA: Diagnosis not present

## 2022-02-20 DIAGNOSIS — G40209 Localization-related (focal) (partial) symptomatic epilepsy and epileptic syndromes with complex partial seizures, not intractable, without status epilepticus: Secondary | ICD-10-CM | POA: Diagnosis not present

## 2022-02-20 DIAGNOSIS — G9340 Encephalopathy, unspecified: Secondary | ICD-10-CM | POA: Diagnosis not present

## 2022-02-20 DIAGNOSIS — I119 Hypertensive heart disease without heart failure: Secondary | ICD-10-CM | POA: Diagnosis not present

## 2022-02-20 DIAGNOSIS — G2119 Other drug induced secondary parkinsonism: Secondary | ICD-10-CM | POA: Diagnosis not present

## 2022-02-20 DIAGNOSIS — Z96652 Presence of left artificial knee joint: Secondary | ICD-10-CM | POA: Diagnosis not present

## 2022-02-20 DIAGNOSIS — Z471 Aftercare following joint replacement surgery: Secondary | ICD-10-CM | POA: Diagnosis not present

## 2022-02-21 DIAGNOSIS — G2119 Other drug induced secondary parkinsonism: Secondary | ICD-10-CM | POA: Diagnosis not present

## 2022-02-21 DIAGNOSIS — G9340 Encephalopathy, unspecified: Secondary | ICD-10-CM | POA: Diagnosis not present

## 2022-02-21 DIAGNOSIS — Z471 Aftercare following joint replacement surgery: Secondary | ICD-10-CM | POA: Diagnosis not present

## 2022-02-21 DIAGNOSIS — Z96652 Presence of left artificial knee joint: Secondary | ICD-10-CM | POA: Diagnosis not present

## 2022-02-21 DIAGNOSIS — I119 Hypertensive heart disease without heart failure: Secondary | ICD-10-CM | POA: Diagnosis not present

## 2022-02-21 DIAGNOSIS — G40209 Localization-related (focal) (partial) symptomatic epilepsy and epileptic syndromes with complex partial seizures, not intractable, without status epilepticus: Secondary | ICD-10-CM | POA: Diagnosis not present

## 2022-02-25 DIAGNOSIS — Z96652 Presence of left artificial knee joint: Secondary | ICD-10-CM | POA: Diagnosis not present

## 2022-02-25 DIAGNOSIS — I119 Hypertensive heart disease without heart failure: Secondary | ICD-10-CM | POA: Diagnosis not present

## 2022-02-25 DIAGNOSIS — G2119 Other drug induced secondary parkinsonism: Secondary | ICD-10-CM | POA: Diagnosis not present

## 2022-02-25 DIAGNOSIS — G9340 Encephalopathy, unspecified: Secondary | ICD-10-CM | POA: Diagnosis not present

## 2022-02-25 DIAGNOSIS — G40209 Localization-related (focal) (partial) symptomatic epilepsy and epileptic syndromes with complex partial seizures, not intractable, without status epilepticus: Secondary | ICD-10-CM | POA: Diagnosis not present

## 2022-02-25 DIAGNOSIS — Z471 Aftercare following joint replacement surgery: Secondary | ICD-10-CM | POA: Diagnosis not present

## 2022-02-26 DIAGNOSIS — Z20822 Contact with and (suspected) exposure to covid-19: Secondary | ICD-10-CM | POA: Diagnosis not present

## 2022-02-26 DIAGNOSIS — M25562 Pain in left knee: Secondary | ICD-10-CM | POA: Diagnosis not present

## 2022-02-28 DIAGNOSIS — M25562 Pain in left knee: Secondary | ICD-10-CM | POA: Diagnosis not present

## 2022-03-05 DIAGNOSIS — Z5189 Encounter for other specified aftercare: Secondary | ICD-10-CM | POA: Diagnosis not present

## 2022-03-05 DIAGNOSIS — M25562 Pain in left knee: Secondary | ICD-10-CM | POA: Diagnosis not present

## 2022-03-07 DIAGNOSIS — M25562 Pain in left knee: Secondary | ICD-10-CM | POA: Diagnosis not present

## 2022-03-12 DIAGNOSIS — Z96652 Presence of left artificial knee joint: Secondary | ICD-10-CM | POA: Diagnosis not present

## 2022-03-12 DIAGNOSIS — M25562 Pain in left knee: Secondary | ICD-10-CM | POA: Diagnosis not present

## 2022-03-14 DIAGNOSIS — Z96652 Presence of left artificial knee joint: Secondary | ICD-10-CM | POA: Diagnosis not present

## 2022-03-14 DIAGNOSIS — M25562 Pain in left knee: Secondary | ICD-10-CM | POA: Diagnosis not present

## 2022-03-15 DIAGNOSIS — R350 Frequency of micturition: Secondary | ICD-10-CM | POA: Diagnosis not present

## 2022-03-15 DIAGNOSIS — N3946 Mixed incontinence: Secondary | ICD-10-CM | POA: Diagnosis not present

## 2022-03-19 DIAGNOSIS — Z96652 Presence of left artificial knee joint: Secondary | ICD-10-CM | POA: Diagnosis not present

## 2022-03-19 DIAGNOSIS — M25562 Pain in left knee: Secondary | ICD-10-CM | POA: Diagnosis not present

## 2022-03-21 DIAGNOSIS — Z780 Asymptomatic menopausal state: Secondary | ICD-10-CM | POA: Diagnosis not present

## 2022-03-21 DIAGNOSIS — Z79899 Other long term (current) drug therapy: Secondary | ICD-10-CM | POA: Diagnosis not present

## 2022-03-21 DIAGNOSIS — Z885 Allergy status to narcotic agent status: Secondary | ICD-10-CM | POA: Diagnosis not present

## 2022-03-21 DIAGNOSIS — G40209 Localization-related (focal) (partial) symptomatic epilepsy and epileptic syndromes with complex partial seizures, not intractable, without status epilepticus: Secondary | ICD-10-CM | POA: Diagnosis not present

## 2022-03-26 DIAGNOSIS — M25562 Pain in left knee: Secondary | ICD-10-CM | POA: Diagnosis not present

## 2022-03-26 DIAGNOSIS — Z96652 Presence of left artificial knee joint: Secondary | ICD-10-CM | POA: Diagnosis not present

## 2022-04-02 DIAGNOSIS — Z96652 Presence of left artificial knee joint: Secondary | ICD-10-CM | POA: Diagnosis not present

## 2022-04-02 DIAGNOSIS — M25562 Pain in left knee: Secondary | ICD-10-CM | POA: Diagnosis not present

## 2022-04-04 DIAGNOSIS — M25562 Pain in left knee: Secondary | ICD-10-CM | POA: Diagnosis not present

## 2022-04-04 DIAGNOSIS — Z96652 Presence of left artificial knee joint: Secondary | ICD-10-CM | POA: Diagnosis not present

## 2022-04-09 DIAGNOSIS — Z96652 Presence of left artificial knee joint: Secondary | ICD-10-CM | POA: Diagnosis not present

## 2022-04-09 DIAGNOSIS — M25562 Pain in left knee: Secondary | ICD-10-CM | POA: Diagnosis not present

## 2022-04-11 DIAGNOSIS — M25562 Pain in left knee: Secondary | ICD-10-CM | POA: Diagnosis not present

## 2022-04-11 DIAGNOSIS — Z96652 Presence of left artificial knee joint: Secondary | ICD-10-CM | POA: Diagnosis not present

## 2022-04-23 DIAGNOSIS — Z96652 Presence of left artificial knee joint: Secondary | ICD-10-CM | POA: Diagnosis not present

## 2022-04-23 DIAGNOSIS — S0001XA Abrasion of scalp, initial encounter: Secondary | ICD-10-CM | POA: Diagnosis not present

## 2022-04-23 DIAGNOSIS — M25562 Pain in left knee: Secondary | ICD-10-CM | POA: Diagnosis not present

## 2022-04-25 DIAGNOSIS — Z96652 Presence of left artificial knee joint: Secondary | ICD-10-CM | POA: Diagnosis not present

## 2022-04-25 DIAGNOSIS — M25562 Pain in left knee: Secondary | ICD-10-CM | POA: Diagnosis not present

## 2022-05-29 DIAGNOSIS — R351 Nocturia: Secondary | ICD-10-CM | POA: Diagnosis not present

## 2022-05-29 DIAGNOSIS — N3946 Mixed incontinence: Secondary | ICD-10-CM | POA: Diagnosis not present

## 2022-07-02 DIAGNOSIS — E559 Vitamin D deficiency, unspecified: Secondary | ICD-10-CM | POA: Diagnosis not present

## 2022-07-02 DIAGNOSIS — M810 Age-related osteoporosis without current pathological fracture: Secondary | ICD-10-CM | POA: Diagnosis not present

## 2022-07-17 DIAGNOSIS — M1990 Unspecified osteoarthritis, unspecified site: Secondary | ICD-10-CM | POA: Diagnosis not present

## 2022-07-17 DIAGNOSIS — M8580 Other specified disorders of bone density and structure, unspecified site: Secondary | ICD-10-CM | POA: Diagnosis not present

## 2022-07-17 DIAGNOSIS — G40909 Epilepsy, unspecified, not intractable, without status epilepticus: Secondary | ICD-10-CM | POA: Diagnosis not present

## 2022-07-17 DIAGNOSIS — F29 Unspecified psychosis not due to a substance or known physiological condition: Secondary | ICD-10-CM | POA: Diagnosis not present

## 2022-07-17 DIAGNOSIS — K219 Gastro-esophageal reflux disease without esophagitis: Secondary | ICD-10-CM | POA: Diagnosis not present

## 2022-07-17 DIAGNOSIS — M4120 Other idiopathic scoliosis, site unspecified: Secondary | ICD-10-CM | POA: Diagnosis not present

## 2022-07-17 DIAGNOSIS — E785 Hyperlipidemia, unspecified: Secondary | ICD-10-CM | POA: Diagnosis not present

## 2022-07-17 DIAGNOSIS — Z6821 Body mass index (BMI) 21.0-21.9, adult: Secondary | ICD-10-CM | POA: Diagnosis not present

## 2022-07-17 DIAGNOSIS — Z9181 History of falling: Secondary | ICD-10-CM | POA: Diagnosis not present

## 2022-07-22 DIAGNOSIS — H40023 Open angle with borderline findings, high risk, bilateral: Secondary | ICD-10-CM | POA: Diagnosis not present

## 2022-07-24 DIAGNOSIS — Z0000 Encounter for general adult medical examination without abnormal findings: Secondary | ICD-10-CM | POA: Diagnosis not present

## 2022-07-24 DIAGNOSIS — Z1389 Encounter for screening for other disorder: Secondary | ICD-10-CM | POA: Diagnosis not present

## 2022-07-24 DIAGNOSIS — Z6826 Body mass index (BMI) 26.0-26.9, adult: Secondary | ICD-10-CM | POA: Diagnosis not present

## 2022-07-24 DIAGNOSIS — Z23 Encounter for immunization: Secondary | ICD-10-CM | POA: Diagnosis not present

## 2022-07-31 DIAGNOSIS — Z23 Encounter for immunization: Secondary | ICD-10-CM | POA: Diagnosis not present

## 2022-08-02 DIAGNOSIS — Z96652 Presence of left artificial knee joint: Secondary | ICD-10-CM | POA: Diagnosis not present

## 2022-09-02 DIAGNOSIS — E559 Vitamin D deficiency, unspecified: Secondary | ICD-10-CM | POA: Diagnosis not present

## 2022-09-11 DIAGNOSIS — L570 Actinic keratosis: Secondary | ICD-10-CM | POA: Diagnosis not present

## 2022-09-11 DIAGNOSIS — L578 Other skin changes due to chronic exposure to nonionizing radiation: Secondary | ICD-10-CM | POA: Diagnosis not present

## 2022-09-11 DIAGNOSIS — L818 Other specified disorders of pigmentation: Secondary | ICD-10-CM | POA: Diagnosis not present

## 2022-09-11 DIAGNOSIS — L821 Other seborrheic keratosis: Secondary | ICD-10-CM | POA: Diagnosis not present

## 2022-09-11 DIAGNOSIS — D225 Melanocytic nevi of trunk: Secondary | ICD-10-CM | POA: Diagnosis not present

## 2022-09-11 DIAGNOSIS — D2261 Melanocytic nevi of right upper limb, including shoulder: Secondary | ICD-10-CM | POA: Diagnosis not present

## 2022-09-11 DIAGNOSIS — Z808 Family history of malignant neoplasm of other organs or systems: Secondary | ICD-10-CM | POA: Diagnosis not present

## 2022-09-11 DIAGNOSIS — D220 Melanocytic nevi of lip: Secondary | ICD-10-CM | POA: Diagnosis not present

## 2022-09-11 DIAGNOSIS — D485 Neoplasm of uncertain behavior of skin: Secondary | ICD-10-CM | POA: Diagnosis not present

## 2022-09-11 DIAGNOSIS — L814 Other melanin hyperpigmentation: Secondary | ICD-10-CM | POA: Diagnosis not present

## 2022-10-04 DIAGNOSIS — L570 Actinic keratosis: Secondary | ICD-10-CM | POA: Diagnosis not present

## 2022-10-04 DIAGNOSIS — L578 Other skin changes due to chronic exposure to nonionizing radiation: Secondary | ICD-10-CM | POA: Diagnosis not present

## 2022-10-04 DIAGNOSIS — L910 Hypertrophic scar: Secondary | ICD-10-CM | POA: Diagnosis not present

## 2022-10-04 DIAGNOSIS — R58 Hemorrhage, not elsewhere classified: Secondary | ICD-10-CM | POA: Diagnosis not present

## 2022-10-08 DIAGNOSIS — G40109 Localization-related (focal) (partial) symptomatic epilepsy and epileptic syndromes with simple partial seizures, not intractable, without status epilepticus: Secondary | ICD-10-CM | POA: Diagnosis not present

## 2022-10-08 DIAGNOSIS — G4733 Obstructive sleep apnea (adult) (pediatric): Secondary | ICD-10-CM | POA: Diagnosis not present

## 2022-11-15 DIAGNOSIS — Z6827 Body mass index (BMI) 27.0-27.9, adult: Secondary | ICD-10-CM | POA: Diagnosis not present

## 2022-11-15 DIAGNOSIS — M4134 Thoracogenic scoliosis, thoracic region: Secondary | ICD-10-CM | POA: Diagnosis not present

## 2022-11-22 DIAGNOSIS — H40023 Open angle with borderline findings, high risk, bilateral: Secondary | ICD-10-CM | POA: Diagnosis not present

## 2022-12-05 DIAGNOSIS — M419 Scoliosis, unspecified: Secondary | ICD-10-CM | POA: Diagnosis not present

## 2023-01-06 DIAGNOSIS — M419 Scoliosis, unspecified: Secondary | ICD-10-CM | POA: Diagnosis not present

## 2023-01-06 DIAGNOSIS — R2689 Other abnormalities of gait and mobility: Secondary | ICD-10-CM | POA: Diagnosis not present

## 2023-01-09 DIAGNOSIS — R2689 Other abnormalities of gait and mobility: Secondary | ICD-10-CM | POA: Diagnosis not present

## 2023-01-09 DIAGNOSIS — M419 Scoliosis, unspecified: Secondary | ICD-10-CM | POA: Diagnosis not present

## 2023-01-13 DIAGNOSIS — R2689 Other abnormalities of gait and mobility: Secondary | ICD-10-CM | POA: Diagnosis not present

## 2023-01-13 DIAGNOSIS — M419 Scoliosis, unspecified: Secondary | ICD-10-CM | POA: Diagnosis not present

## 2023-01-15 DIAGNOSIS — E785 Hyperlipidemia, unspecified: Secondary | ICD-10-CM | POA: Diagnosis not present

## 2023-01-15 DIAGNOSIS — G40909 Epilepsy, unspecified, not intractable, without status epilepticus: Secondary | ICD-10-CM | POA: Diagnosis not present

## 2023-01-15 DIAGNOSIS — F29 Unspecified psychosis not due to a substance or known physiological condition: Secondary | ICD-10-CM | POA: Diagnosis not present

## 2023-01-15 DIAGNOSIS — Z9181 History of falling: Secondary | ICD-10-CM | POA: Diagnosis not present

## 2023-01-15 DIAGNOSIS — K219 Gastro-esophageal reflux disease without esophagitis: Secondary | ICD-10-CM | POA: Diagnosis not present

## 2023-01-15 DIAGNOSIS — M8580 Other specified disorders of bone density and structure, unspecified site: Secondary | ICD-10-CM | POA: Diagnosis not present

## 2023-01-15 DIAGNOSIS — Z6827 Body mass index (BMI) 27.0-27.9, adult: Secondary | ICD-10-CM | POA: Diagnosis not present

## 2023-01-15 DIAGNOSIS — M1990 Unspecified osteoarthritis, unspecified site: Secondary | ICD-10-CM | POA: Diagnosis not present

## 2023-01-15 DIAGNOSIS — R2681 Unsteadiness on feet: Secondary | ICD-10-CM | POA: Diagnosis not present

## 2023-01-15 DIAGNOSIS — M4120 Other idiopathic scoliosis, site unspecified: Secondary | ICD-10-CM | POA: Diagnosis not present

## 2023-01-16 DIAGNOSIS — R2689 Other abnormalities of gait and mobility: Secondary | ICD-10-CM | POA: Diagnosis not present

## 2023-01-16 DIAGNOSIS — M419 Scoliosis, unspecified: Secondary | ICD-10-CM | POA: Diagnosis not present

## 2023-01-27 DIAGNOSIS — Z1231 Encounter for screening mammogram for malignant neoplasm of breast: Secondary | ICD-10-CM | POA: Diagnosis not present

## 2023-01-28 DIAGNOSIS — R2689 Other abnormalities of gait and mobility: Secondary | ICD-10-CM | POA: Diagnosis not present

## 2023-01-28 DIAGNOSIS — M419 Scoliosis, unspecified: Secondary | ICD-10-CM | POA: Diagnosis not present

## 2023-01-29 ENCOUNTER — Other Ambulatory Visit (HOSPITAL_BASED_OUTPATIENT_CLINIC_OR_DEPARTMENT_OTHER): Payer: Self-pay | Admitting: Obstetrics and Gynecology

## 2023-01-29 DIAGNOSIS — G4733 Obstructive sleep apnea (adult) (pediatric): Secondary | ICD-10-CM | POA: Diagnosis not present

## 2023-01-29 DIAGNOSIS — Z8249 Family history of ischemic heart disease and other diseases of the circulatory system: Secondary | ICD-10-CM

## 2023-01-29 DIAGNOSIS — G40109 Localization-related (focal) (partial) symptomatic epilepsy and epileptic syndromes with simple partial seizures, not intractable, without status epilepticus: Secondary | ICD-10-CM | POA: Diagnosis not present

## 2023-02-03 DIAGNOSIS — R2689 Other abnormalities of gait and mobility: Secondary | ICD-10-CM | POA: Diagnosis not present

## 2023-02-03 DIAGNOSIS — M419 Scoliosis, unspecified: Secondary | ICD-10-CM | POA: Diagnosis not present

## 2023-02-05 DIAGNOSIS — M419 Scoliosis, unspecified: Secondary | ICD-10-CM | POA: Diagnosis not present

## 2023-02-05 DIAGNOSIS — R2689 Other abnormalities of gait and mobility: Secondary | ICD-10-CM | POA: Diagnosis not present

## 2023-02-10 DIAGNOSIS — R2689 Other abnormalities of gait and mobility: Secondary | ICD-10-CM | POA: Diagnosis not present

## 2023-02-10 DIAGNOSIS — M419 Scoliosis, unspecified: Secondary | ICD-10-CM | POA: Diagnosis not present

## 2023-02-12 DIAGNOSIS — M419 Scoliosis, unspecified: Secondary | ICD-10-CM | POA: Diagnosis not present

## 2023-02-12 DIAGNOSIS — R2689 Other abnormalities of gait and mobility: Secondary | ICD-10-CM | POA: Diagnosis not present

## 2023-02-17 DIAGNOSIS — R2689 Other abnormalities of gait and mobility: Secondary | ICD-10-CM | POA: Diagnosis not present

## 2023-02-17 DIAGNOSIS — M419 Scoliosis, unspecified: Secondary | ICD-10-CM | POA: Diagnosis not present

## 2023-02-21 ENCOUNTER — Ambulatory Visit (HOSPITAL_COMMUNITY)
Admission: RE | Admit: 2023-02-21 | Discharge: 2023-02-21 | Disposition: A | Discharge status: Home / Self Care | Payer: Medicare Other | Source: Ambulatory Visit | Attending: Obstetrics and Gynecology | Admitting: Obstetrics and Gynecology

## 2023-02-21 DIAGNOSIS — Z8249 Family history of ischemic heart disease and other diseases of the circulatory system: Secondary | ICD-10-CM | POA: Insufficient documentation

## 2023-02-24 DIAGNOSIS — R2689 Other abnormalities of gait and mobility: Secondary | ICD-10-CM | POA: Diagnosis not present

## 2023-02-24 DIAGNOSIS — M419 Scoliosis, unspecified: Secondary | ICD-10-CM | POA: Diagnosis not present

## 2023-02-26 DIAGNOSIS — R2689 Other abnormalities of gait and mobility: Secondary | ICD-10-CM | POA: Diagnosis not present

## 2023-02-26 DIAGNOSIS — M419 Scoliosis, unspecified: Secondary | ICD-10-CM | POA: Diagnosis not present

## 2023-03-10 DIAGNOSIS — R2689 Other abnormalities of gait and mobility: Secondary | ICD-10-CM | POA: Diagnosis not present

## 2023-03-10 DIAGNOSIS — M419 Scoliosis, unspecified: Secondary | ICD-10-CM | POA: Diagnosis not present

## 2023-03-12 DIAGNOSIS — R2689 Other abnormalities of gait and mobility: Secondary | ICD-10-CM | POA: Diagnosis not present

## 2023-03-12 DIAGNOSIS — M419 Scoliosis, unspecified: Secondary | ICD-10-CM | POA: Diagnosis not present

## 2023-03-18 DIAGNOSIS — R2689 Other abnormalities of gait and mobility: Secondary | ICD-10-CM | POA: Diagnosis not present

## 2023-03-18 DIAGNOSIS — M419 Scoliosis, unspecified: Secondary | ICD-10-CM | POA: Diagnosis not present

## 2023-03-20 DIAGNOSIS — M419 Scoliosis, unspecified: Secondary | ICD-10-CM | POA: Diagnosis not present

## 2023-03-20 DIAGNOSIS — R2689 Other abnormalities of gait and mobility: Secondary | ICD-10-CM | POA: Diagnosis not present

## 2023-03-25 DIAGNOSIS — R2689 Other abnormalities of gait and mobility: Secondary | ICD-10-CM | POA: Diagnosis not present

## 2023-03-25 DIAGNOSIS — M419 Scoliosis, unspecified: Secondary | ICD-10-CM | POA: Diagnosis not present

## 2023-03-26 DIAGNOSIS — H40023 Open angle with borderline findings, high risk, bilateral: Secondary | ICD-10-CM | POA: Diagnosis not present

## 2023-03-27 DIAGNOSIS — M419 Scoliosis, unspecified: Secondary | ICD-10-CM | POA: Diagnosis not present

## 2023-03-27 DIAGNOSIS — R2689 Other abnormalities of gait and mobility: Secondary | ICD-10-CM | POA: Diagnosis not present

## 2023-03-31 DIAGNOSIS — M419 Scoliosis, unspecified: Secondary | ICD-10-CM | POA: Diagnosis not present

## 2023-03-31 DIAGNOSIS — R2689 Other abnormalities of gait and mobility: Secondary | ICD-10-CM | POA: Diagnosis not present

## 2023-08-13 DIAGNOSIS — Z6826 Body mass index (BMI) 26.0-26.9, adult: Secondary | ICD-10-CM | POA: Diagnosis not present

## 2023-08-13 DIAGNOSIS — Z0000 Encounter for general adult medical examination without abnormal findings: Secondary | ICD-10-CM | POA: Diagnosis not present

## 2023-08-13 DIAGNOSIS — Z1331 Encounter for screening for depression: Secondary | ICD-10-CM | POA: Diagnosis not present

## 2023-08-13 DIAGNOSIS — Z23 Encounter for immunization: Secondary | ICD-10-CM | POA: Diagnosis not present

## 2023-08-27 DIAGNOSIS — F29 Unspecified psychosis not due to a substance or known physiological condition: Secondary | ICD-10-CM | POA: Diagnosis not present

## 2023-08-27 DIAGNOSIS — M1990 Unspecified osteoarthritis, unspecified site: Secondary | ICD-10-CM | POA: Diagnosis not present

## 2023-08-27 DIAGNOSIS — M8580 Other specified disorders of bone density and structure, unspecified site: Secondary | ICD-10-CM | POA: Diagnosis not present

## 2023-08-27 DIAGNOSIS — E785 Hyperlipidemia, unspecified: Secondary | ICD-10-CM | POA: Diagnosis not present

## 2023-08-27 DIAGNOSIS — R2681 Unsteadiness on feet: Secondary | ICD-10-CM | POA: Diagnosis not present

## 2023-08-27 DIAGNOSIS — M4120 Other idiopathic scoliosis, site unspecified: Secondary | ICD-10-CM | POA: Diagnosis not present

## 2023-08-27 DIAGNOSIS — Z9181 History of falling: Secondary | ICD-10-CM | POA: Diagnosis not present

## 2023-08-27 DIAGNOSIS — K219 Gastro-esophageal reflux disease without esophagitis: Secondary | ICD-10-CM | POA: Diagnosis not present

## 2023-08-27 DIAGNOSIS — G40909 Epilepsy, unspecified, not intractable, without status epilepticus: Secondary | ICD-10-CM | POA: Diagnosis not present

## 2023-09-17 DIAGNOSIS — L818 Other specified disorders of pigmentation: Secondary | ICD-10-CM | POA: Diagnosis not present

## 2023-09-17 DIAGNOSIS — D485 Neoplasm of uncertain behavior of skin: Secondary | ICD-10-CM | POA: Diagnosis not present

## 2023-09-17 DIAGNOSIS — D220 Melanocytic nevi of lip: Secondary | ICD-10-CM | POA: Diagnosis not present

## 2023-09-17 DIAGNOSIS — C44612 Basal cell carcinoma of skin of right upper limb, including shoulder: Secondary | ICD-10-CM | POA: Diagnosis not present

## 2023-09-17 DIAGNOSIS — L639 Alopecia areata, unspecified: Secondary | ICD-10-CM | POA: Diagnosis not present

## 2023-09-17 DIAGNOSIS — D225 Melanocytic nevi of trunk: Secondary | ICD-10-CM | POA: Diagnosis not present

## 2023-09-17 DIAGNOSIS — D2261 Melanocytic nevi of right upper limb, including shoulder: Secondary | ICD-10-CM | POA: Diagnosis not present

## 2023-09-17 DIAGNOSIS — L821 Other seborrheic keratosis: Secondary | ICD-10-CM | POA: Diagnosis not present

## 2023-09-17 DIAGNOSIS — Z808 Family history of malignant neoplasm of other organs or systems: Secondary | ICD-10-CM | POA: Diagnosis not present

## 2023-09-17 DIAGNOSIS — L578 Other skin changes due to chronic exposure to nonionizing radiation: Secondary | ICD-10-CM | POA: Diagnosis not present

## 2023-09-17 DIAGNOSIS — D1801 Hemangioma of skin and subcutaneous tissue: Secondary | ICD-10-CM | POA: Diagnosis not present

## 2023-09-17 DIAGNOSIS — R58 Hemorrhage, not elsewhere classified: Secondary | ICD-10-CM | POA: Diagnosis not present

## 2023-11-19 DIAGNOSIS — R569 Unspecified convulsions: Secondary | ICD-10-CM | POA: Diagnosis not present

## 2023-11-26 DIAGNOSIS — H40023 Open angle with borderline findings, high risk, bilateral: Secondary | ICD-10-CM | POA: Diagnosis not present

## 2023-12-26 DIAGNOSIS — C44519 Basal cell carcinoma of skin of other part of trunk: Secondary | ICD-10-CM | POA: Diagnosis not present

## 2023-12-26 DIAGNOSIS — D1801 Hemangioma of skin and subcutaneous tissue: Secondary | ICD-10-CM | POA: Diagnosis not present

## 2024-02-25 DIAGNOSIS — M1990 Unspecified osteoarthritis, unspecified site: Secondary | ICD-10-CM | POA: Diagnosis not present

## 2024-02-25 DIAGNOSIS — E785 Hyperlipidemia, unspecified: Secondary | ICD-10-CM | POA: Diagnosis not present

## 2024-03-11 DIAGNOSIS — G40209 Localization-related (focal) (partial) symptomatic epilepsy and epileptic syndromes with complex partial seizures, not intractable, without status epilepticus: Secondary | ICD-10-CM | POA: Diagnosis not present

## 2024-03-27 DIAGNOSIS — M1990 Unspecified osteoarthritis, unspecified site: Secondary | ICD-10-CM | POA: Diagnosis not present

## 2024-03-27 DIAGNOSIS — E785 Hyperlipidemia, unspecified: Secondary | ICD-10-CM | POA: Diagnosis not present

## 2024-03-29 DIAGNOSIS — N958 Other specified menopausal and perimenopausal disorders: Secondary | ICD-10-CM | POA: Diagnosis not present

## 2024-03-29 DIAGNOSIS — Z01419 Encounter for gynecological examination (general) (routine) without abnormal findings: Secondary | ICD-10-CM | POA: Diagnosis not present

## 2024-03-29 DIAGNOSIS — Z1231 Encounter for screening mammogram for malignant neoplasm of breast: Secondary | ICD-10-CM | POA: Diagnosis not present

## 2024-03-29 DIAGNOSIS — Z6826 Body mass index (BMI) 26.0-26.9, adult: Secondary | ICD-10-CM | POA: Diagnosis not present

## 2024-03-29 DIAGNOSIS — M816 Localized osteoporosis [Lequesne]: Secondary | ICD-10-CM | POA: Diagnosis not present

## 2024-05-27 DIAGNOSIS — E785 Hyperlipidemia, unspecified: Secondary | ICD-10-CM | POA: Diagnosis not present

## 2024-05-27 DIAGNOSIS — M1990 Unspecified osteoarthritis, unspecified site: Secondary | ICD-10-CM | POA: Diagnosis not present

## 2024-06-01 DIAGNOSIS — H40023 Open angle with borderline findings, high risk, bilateral: Secondary | ICD-10-CM | POA: Diagnosis not present

## 2024-06-27 DIAGNOSIS — E785 Hyperlipidemia, unspecified: Secondary | ICD-10-CM | POA: Diagnosis not present

## 2024-06-27 DIAGNOSIS — M1990 Unspecified osteoarthritis, unspecified site: Secondary | ICD-10-CM | POA: Diagnosis not present

## 2024-06-30 DIAGNOSIS — L821 Other seborrheic keratosis: Secondary | ICD-10-CM | POA: Diagnosis not present

## 2024-06-30 DIAGNOSIS — D220 Melanocytic nevi of lip: Secondary | ICD-10-CM | POA: Diagnosis not present

## 2024-06-30 DIAGNOSIS — L570 Actinic keratosis: Secondary | ICD-10-CM | POA: Diagnosis not present

## 2024-06-30 DIAGNOSIS — Z808 Family history of malignant neoplasm of other organs or systems: Secondary | ICD-10-CM | POA: Diagnosis not present

## 2024-06-30 DIAGNOSIS — D225 Melanocytic nevi of trunk: Secondary | ICD-10-CM | POA: Diagnosis not present

## 2024-06-30 DIAGNOSIS — D1801 Hemangioma of skin and subcutaneous tissue: Secondary | ICD-10-CM | POA: Diagnosis not present

## 2024-06-30 DIAGNOSIS — C44612 Basal cell carcinoma of skin of right upper limb, including shoulder: Secondary | ICD-10-CM | POA: Diagnosis not present

## 2024-06-30 DIAGNOSIS — L578 Other skin changes due to chronic exposure to nonionizing radiation: Secondary | ICD-10-CM | POA: Diagnosis not present

## 2024-06-30 DIAGNOSIS — D485 Neoplasm of uncertain behavior of skin: Secondary | ICD-10-CM | POA: Diagnosis not present

## 2024-06-30 DIAGNOSIS — D2261 Melanocytic nevi of right upper limb, including shoulder: Secondary | ICD-10-CM | POA: Diagnosis not present

## 2024-06-30 DIAGNOSIS — R58 Hemorrhage, not elsewhere classified: Secondary | ICD-10-CM | POA: Diagnosis not present

## 2024-06-30 DIAGNOSIS — L818 Other specified disorders of pigmentation: Secondary | ICD-10-CM | POA: Diagnosis not present

## 2024-07-02 DIAGNOSIS — Z23 Encounter for immunization: Secondary | ICD-10-CM | POA: Diagnosis not present

## 2024-07-23 DIAGNOSIS — Z23 Encounter for immunization: Secondary | ICD-10-CM | POA: Diagnosis not present

## 2024-07-27 DIAGNOSIS — M1990 Unspecified osteoarthritis, unspecified site: Secondary | ICD-10-CM | POA: Diagnosis not present

## 2024-07-27 DIAGNOSIS — E785 Hyperlipidemia, unspecified: Secondary | ICD-10-CM | POA: Diagnosis not present

## 2024-08-30 DIAGNOSIS — M8580 Other specified disorders of bone density and structure, unspecified site: Secondary | ICD-10-CM | POA: Diagnosis not present

## 2024-08-30 DIAGNOSIS — E785 Hyperlipidemia, unspecified: Secondary | ICD-10-CM | POA: Diagnosis not present

## 2024-08-30 DIAGNOSIS — Z0000 Encounter for general adult medical examination without abnormal findings: Secondary | ICD-10-CM | POA: Diagnosis not present

## 2024-08-30 DIAGNOSIS — Z1331 Encounter for screening for depression: Secondary | ICD-10-CM | POA: Diagnosis not present

## 2024-09-01 DIAGNOSIS — G2119 Other drug induced secondary parkinsonism: Secondary | ICD-10-CM | POA: Diagnosis not present

## 2024-09-01 DIAGNOSIS — M8580 Other specified disorders of bone density and structure, unspecified site: Secondary | ICD-10-CM | POA: Diagnosis not present

## 2024-09-01 DIAGNOSIS — G40909 Epilepsy, unspecified, not intractable, without status epilepticus: Secondary | ICD-10-CM | POA: Diagnosis not present

## 2024-09-01 DIAGNOSIS — E785 Hyperlipidemia, unspecified: Secondary | ICD-10-CM | POA: Diagnosis not present

## 2024-09-01 DIAGNOSIS — Z9181 History of falling: Secondary | ICD-10-CM | POA: Diagnosis not present

## 2024-09-01 DIAGNOSIS — Z6825 Body mass index (BMI) 25.0-25.9, adult: Secondary | ICD-10-CM | POA: Diagnosis not present

## 2024-09-01 DIAGNOSIS — M4120 Other idiopathic scoliosis, site unspecified: Secondary | ICD-10-CM | POA: Diagnosis not present

## 2024-09-01 DIAGNOSIS — R2681 Unsteadiness on feet: Secondary | ICD-10-CM | POA: Diagnosis not present

## 2024-09-02 ENCOUNTER — Other Ambulatory Visit (HOSPITAL_BASED_OUTPATIENT_CLINIC_OR_DEPARTMENT_OTHER): Payer: Self-pay | Admitting: Family Medicine

## 2024-09-02 DIAGNOSIS — E785 Hyperlipidemia, unspecified: Secondary | ICD-10-CM

## 2024-09-15 ENCOUNTER — Ambulatory Visit
Admission: RE | Admit: 2024-09-15 | Discharge: 2024-09-15 | Disposition: A | Source: Ambulatory Visit | Attending: Family Medicine | Admitting: Family Medicine

## 2024-09-15 DIAGNOSIS — E785 Hyperlipidemia, unspecified: Secondary | ICD-10-CM

## 2024-09-21 DIAGNOSIS — C44612 Basal cell carcinoma of skin of right upper limb, including shoulder: Secondary | ICD-10-CM | POA: Diagnosis not present

## 2024-12-01 ENCOUNTER — Telehealth: Payer: Self-pay

## 2024-12-02 NOTE — Telephone Encounter (Signed)
 Husband left a message. Romero saw ophthalmology yesterday and they recommended Preservison supplement that has zinc . Husband is asking if it is ok to take Preservison with zinc  after previously told to not take?

## 2024-12-02 NOTE — Telephone Encounter (Signed)
 Zinc  supplement is not advised Get the plan preservision without zinc 

## 2024-12-02 NOTE — Telephone Encounter (Signed)
 Called and given below message to husband and Jaia. They verbalized understanding.
